# Patient Record
Sex: Female | Born: 1940 | Race: Black or African American | Hispanic: No | Marital: Married | Smoking: Never smoker
Health system: Southern US, Community
[De-identification: ages and names within clinical notes are randomized; demographics above are authoritative.]

## PROBLEM LIST (undated history)

## (undated) DIAGNOSIS — N189 Chronic kidney disease, unspecified: Secondary | ICD-10-CM

## (undated) DIAGNOSIS — M1990 Unspecified osteoarthritis, unspecified site: Secondary | ICD-10-CM

## (undated) DIAGNOSIS — D649 Anemia, unspecified: Secondary | ICD-10-CM

## (undated) DIAGNOSIS — R32 Unspecified urinary incontinence: Secondary | ICD-10-CM

## (undated) DIAGNOSIS — M5431 Sciatica, right side: Secondary | ICD-10-CM

## (undated) DIAGNOSIS — K141 Geographic tongue: Secondary | ICD-10-CM

## (undated) DIAGNOSIS — K219 Gastro-esophageal reflux disease without esophagitis: Secondary | ICD-10-CM

## (undated) DIAGNOSIS — K30 Functional dyspepsia: Secondary | ICD-10-CM

## (undated) DIAGNOSIS — A048 Other specified bacterial intestinal infections: Secondary | ICD-10-CM

## (undated) DIAGNOSIS — N8110 Cystocele, unspecified: Secondary | ICD-10-CM

## (undated) DIAGNOSIS — N368 Other specified disorders of urethra: Secondary | ICD-10-CM

## (undated) DIAGNOSIS — R000 Tachycardia, unspecified: Secondary | ICD-10-CM

## (undated) DIAGNOSIS — E785 Hyperlipidemia, unspecified: Secondary | ICD-10-CM

## (undated) HISTORY — DX: Sciatica, right side: M54.31

## (undated) HISTORY — DX: Geographic tongue: K14.1

## (undated) HISTORY — PX: DILATION AND CURETTAGE OF UTERUS: SHX78

## (undated) HISTORY — PX: OTHER SURGICAL HISTORY: SHX169

## (undated) HISTORY — DX: Other specified bacterial intestinal infections: A04.8

## (undated) HISTORY — DX: Unspecified urinary incontinence: R32

## (undated) HISTORY — DX: Functional dyspepsia: K30

## (undated) HISTORY — DX: Cystocele, unspecified: N81.10

## (undated) HISTORY — DX: Hyperlipidemia, unspecified: E78.5

## (undated) HISTORY — DX: Other specified disorders of urethra: N36.8

---

## 2004-09-03 ENCOUNTER — Ambulatory Visit: Payer: Self-pay | Admitting: Family Medicine

## 2009-12-11 LAB — HM PAP SMEAR: HM PAP: NORMAL

## 2010-04-12 LAB — HM COLONOSCOPY: HM Colonoscopy: NORMAL

## 2011-07-23 DIAGNOSIS — R209 Unspecified disturbances of skin sensation: Secondary | ICD-10-CM | POA: Diagnosis not present

## 2011-07-23 DIAGNOSIS — R1011 Right upper quadrant pain: Secondary | ICD-10-CM | POA: Diagnosis not present

## 2011-07-28 DIAGNOSIS — H15009 Unspecified scleritis, unspecified eye: Secondary | ICD-10-CM | POA: Diagnosis not present

## 2011-08-05 ENCOUNTER — Ambulatory Visit: Payer: Self-pay | Admitting: Family Medicine

## 2011-08-05 DIAGNOSIS — R1011 Right upper quadrant pain: Secondary | ICD-10-CM | POA: Diagnosis not present

## 2011-08-05 DIAGNOSIS — IMO0002 Reserved for concepts with insufficient information to code with codable children: Secondary | ICD-10-CM | POA: Diagnosis not present

## 2011-08-23 DIAGNOSIS — N8111 Cystocele, midline: Secondary | ICD-10-CM | POA: Diagnosis not present

## 2011-08-23 DIAGNOSIS — R350 Frequency of micturition: Secondary | ICD-10-CM | POA: Diagnosis not present

## 2011-12-05 DIAGNOSIS — G4700 Insomnia, unspecified: Secondary | ICD-10-CM | POA: Diagnosis not present

## 2011-12-05 DIAGNOSIS — M5430 Sciatica, unspecified side: Secondary | ICD-10-CM | POA: Diagnosis not present

## 2011-12-05 DIAGNOSIS — M76899 Other specified enthesopathies of unspecified lower limb, excluding foot: Secondary | ICD-10-CM | POA: Diagnosis not present

## 2011-12-05 DIAGNOSIS — R269 Unspecified abnormalities of gait and mobility: Secondary | ICD-10-CM | POA: Diagnosis not present

## 2012-09-08 DIAGNOSIS — W19XXXS Unspecified fall, sequela: Secondary | ICD-10-CM | POA: Diagnosis not present

## 2012-09-08 DIAGNOSIS — M542 Cervicalgia: Secondary | ICD-10-CM | POA: Diagnosis not present

## 2012-10-05 ENCOUNTER — Ambulatory Visit: Payer: Self-pay | Admitting: Family Medicine

## 2012-10-05 DIAGNOSIS — R030 Elevated blood-pressure reading, without diagnosis of hypertension: Secondary | ICD-10-CM | POA: Diagnosis not present

## 2012-10-05 DIAGNOSIS — S93409A Sprain of unspecified ligament of unspecified ankle, initial encounter: Secondary | ICD-10-CM | POA: Diagnosis not present

## 2012-10-05 DIAGNOSIS — M79609 Pain in unspecified limb: Secondary | ICD-10-CM | POA: Diagnosis not present

## 2013-10-09 DIAGNOSIS — H2510 Age-related nuclear cataract, unspecified eye: Secondary | ICD-10-CM | POA: Diagnosis not present

## 2013-10-24 DIAGNOSIS — H349 Unspecified retinal vascular occlusion: Secondary | ICD-10-CM | POA: Diagnosis not present

## 2013-11-01 DIAGNOSIS — H3581 Retinal edema: Secondary | ICD-10-CM | POA: Diagnosis not present

## 2013-11-01 DIAGNOSIS — H2510 Age-related nuclear cataract, unspecified eye: Secondary | ICD-10-CM | POA: Diagnosis not present

## 2013-11-01 DIAGNOSIS — H35059 Retinal neovascularization, unspecified, unspecified eye: Secondary | ICD-10-CM | POA: Diagnosis not present

## 2013-11-27 DIAGNOSIS — H35059 Retinal neovascularization, unspecified, unspecified eye: Secondary | ICD-10-CM | POA: Diagnosis not present

## 2013-11-27 DIAGNOSIS — H43819 Vitreous degeneration, unspecified eye: Secondary | ICD-10-CM | POA: Diagnosis not present

## 2013-11-27 DIAGNOSIS — H2510 Age-related nuclear cataract, unspecified eye: Secondary | ICD-10-CM | POA: Diagnosis not present

## 2014-01-03 DIAGNOSIS — H43819 Vitreous degeneration, unspecified eye: Secondary | ICD-10-CM | POA: Diagnosis not present

## 2014-01-03 DIAGNOSIS — H35059 Retinal neovascularization, unspecified, unspecified eye: Secondary | ICD-10-CM | POA: Diagnosis not present

## 2014-01-03 DIAGNOSIS — H2510 Age-related nuclear cataract, unspecified eye: Secondary | ICD-10-CM | POA: Diagnosis not present

## 2014-02-28 DIAGNOSIS — H43813 Vitreous degeneration, bilateral: Secondary | ICD-10-CM | POA: Diagnosis not present

## 2014-02-28 DIAGNOSIS — H2513 Age-related nuclear cataract, bilateral: Secondary | ICD-10-CM | POA: Diagnosis not present

## 2014-02-28 DIAGNOSIS — H35052 Retinal neovascularization, unspecified, left eye: Secondary | ICD-10-CM | POA: Diagnosis not present

## 2014-04-06 ENCOUNTER — Emergency Department: Payer: Self-pay | Admitting: Emergency Medicine

## 2014-04-06 DIAGNOSIS — M5416 Radiculopathy, lumbar region: Secondary | ICD-10-CM | POA: Diagnosis not present

## 2014-04-06 DIAGNOSIS — R030 Elevated blood-pressure reading, without diagnosis of hypertension: Secondary | ICD-10-CM | POA: Diagnosis not present

## 2014-04-06 DIAGNOSIS — M79651 Pain in right thigh: Secondary | ICD-10-CM | POA: Diagnosis not present

## 2014-04-11 DIAGNOSIS — H2513 Age-related nuclear cataract, bilateral: Secondary | ICD-10-CM | POA: Diagnosis not present

## 2014-04-11 DIAGNOSIS — H43813 Vitreous degeneration, bilateral: Secondary | ICD-10-CM | POA: Diagnosis not present

## 2014-04-11 DIAGNOSIS — H35052 Retinal neovascularization, unspecified, left eye: Secondary | ICD-10-CM | POA: Diagnosis not present

## 2014-04-25 DIAGNOSIS — H2513 Age-related nuclear cataract, bilateral: Secondary | ICD-10-CM | POA: Diagnosis not present

## 2014-04-25 DIAGNOSIS — H35053 Retinal neovascularization, unspecified, bilateral: Secondary | ICD-10-CM | POA: Diagnosis not present

## 2014-04-25 DIAGNOSIS — H43813 Vitreous degeneration, bilateral: Secondary | ICD-10-CM | POA: Diagnosis not present

## 2014-05-02 DIAGNOSIS — K30 Functional dyspepsia: Secondary | ICD-10-CM | POA: Diagnosis not present

## 2014-05-02 DIAGNOSIS — M5431 Sciatica, right side: Secondary | ICD-10-CM | POA: Diagnosis not present

## 2014-05-02 DIAGNOSIS — Z13220 Encounter for screening for lipoid disorders: Secondary | ICD-10-CM | POA: Diagnosis not present

## 2014-05-02 DIAGNOSIS — R634 Abnormal weight loss: Secondary | ICD-10-CM | POA: Diagnosis not present

## 2014-05-09 DIAGNOSIS — R634 Abnormal weight loss: Secondary | ICD-10-CM | POA: Diagnosis not present

## 2014-05-09 DIAGNOSIS — E785 Hyperlipidemia, unspecified: Secondary | ICD-10-CM | POA: Diagnosis not present

## 2014-05-09 DIAGNOSIS — Z13220 Encounter for screening for lipoid disorders: Secondary | ICD-10-CM | POA: Diagnosis not present

## 2014-05-09 DIAGNOSIS — K30 Functional dyspepsia: Secondary | ICD-10-CM | POA: Diagnosis not present

## 2014-05-09 LAB — LIPID PANEL
Cholesterol: 235 mg/dL — AB (ref 0–200)
HDL: 65 mg/dL (ref 35–70)
LDL Cholesterol: 148 mg/dL
Triglycerides: 109 mg/dL (ref 40–160)

## 2014-05-21 DIAGNOSIS — Z1151 Encounter for screening for human papillomavirus (HPV): Secondary | ICD-10-CM | POA: Diagnosis not present

## 2014-05-21 DIAGNOSIS — Z124 Encounter for screening for malignant neoplasm of cervix: Secondary | ICD-10-CM | POA: Diagnosis not present

## 2014-05-21 DIAGNOSIS — Z1239 Encounter for other screening for malignant neoplasm of breast: Secondary | ICD-10-CM | POA: Diagnosis not present

## 2014-05-21 DIAGNOSIS — A048 Other specified bacterial intestinal infections: Secondary | ICD-10-CM | POA: Diagnosis not present

## 2014-05-21 DIAGNOSIS — Z9181 History of falling: Secondary | ICD-10-CM | POA: Diagnosis not present

## 2014-05-21 DIAGNOSIS — Z1389 Encounter for screening for other disorder: Secondary | ICD-10-CM | POA: Diagnosis not present

## 2014-05-21 DIAGNOSIS — Z0000 Encounter for general adult medical examination without abnormal findings: Secondary | ICD-10-CM | POA: Diagnosis not present

## 2014-05-21 DIAGNOSIS — Z1211 Encounter for screening for malignant neoplasm of colon: Secondary | ICD-10-CM | POA: Diagnosis not present

## 2014-06-06 DIAGNOSIS — H43813 Vitreous degeneration, bilateral: Secondary | ICD-10-CM | POA: Diagnosis not present

## 2014-06-06 DIAGNOSIS — H2513 Age-related nuclear cataract, bilateral: Secondary | ICD-10-CM | POA: Diagnosis not present

## 2014-06-06 DIAGNOSIS — H35052 Retinal neovascularization, unspecified, left eye: Secondary | ICD-10-CM | POA: Diagnosis not present

## 2014-07-25 DIAGNOSIS — H43813 Vitreous degeneration, bilateral: Secondary | ICD-10-CM | POA: Diagnosis not present

## 2014-07-25 DIAGNOSIS — H2513 Age-related nuclear cataract, bilateral: Secondary | ICD-10-CM | POA: Diagnosis not present

## 2014-07-25 DIAGNOSIS — H35052 Retinal neovascularization, unspecified, left eye: Secondary | ICD-10-CM | POA: Diagnosis not present

## 2014-09-19 DIAGNOSIS — H43813 Vitreous degeneration, bilateral: Secondary | ICD-10-CM | POA: Diagnosis not present

## 2014-09-19 DIAGNOSIS — H2513 Age-related nuclear cataract, bilateral: Secondary | ICD-10-CM | POA: Diagnosis not present

## 2014-09-19 DIAGNOSIS — H35052 Retinal neovascularization, unspecified, left eye: Secondary | ICD-10-CM | POA: Diagnosis not present

## 2014-11-16 ENCOUNTER — Encounter: Payer: Self-pay | Admitting: Family Medicine

## 2014-11-16 DIAGNOSIS — K141 Geographic tongue: Secondary | ICD-10-CM | POA: Insufficient documentation

## 2014-11-16 DIAGNOSIS — R32 Unspecified urinary incontinence: Secondary | ICD-10-CM | POA: Insufficient documentation

## 2014-11-16 DIAGNOSIS — Z8619 Personal history of other infectious and parasitic diseases: Secondary | ICD-10-CM | POA: Insufficient documentation

## 2014-11-16 DIAGNOSIS — N368 Other specified disorders of urethra: Secondary | ICD-10-CM | POA: Insufficient documentation

## 2014-11-16 DIAGNOSIS — N8110 Cystocele, unspecified: Secondary | ICD-10-CM | POA: Insufficient documentation

## 2014-11-16 DIAGNOSIS — M5430 Sciatica, unspecified side: Secondary | ICD-10-CM | POA: Insufficient documentation

## 2014-11-21 ENCOUNTER — Encounter: Payer: Self-pay | Admitting: Family Medicine

## 2014-11-21 ENCOUNTER — Ambulatory Visit (INDEPENDENT_AMBULATORY_CARE_PROVIDER_SITE_OTHER): Payer: Medicare Other | Admitting: Family Medicine

## 2014-11-21 VITALS — BP 120/62 | HR 76 | Temp 99.0°F | Resp 14 | Ht 65.0 in | Wt 162.4 lb

## 2014-11-21 DIAGNOSIS — Z1239 Encounter for other screening for malignant neoplasm of breast: Secondary | ICD-10-CM | POA: Diagnosis not present

## 2014-11-21 DIAGNOSIS — R32 Unspecified urinary incontinence: Secondary | ICD-10-CM

## 2014-11-21 DIAGNOSIS — Z8619 Personal history of other infectious and parasitic diseases: Secondary | ICD-10-CM | POA: Diagnosis not present

## 2014-11-21 DIAGNOSIS — E785 Hyperlipidemia, unspecified: Secondary | ICD-10-CM

## 2014-11-21 DIAGNOSIS — Z131 Encounter for screening for diabetes mellitus: Secondary | ICD-10-CM

## 2014-11-21 MED ORDER — THERA VITAL M PO TABS: 1.0000 | ORAL_TABLET | Freq: Every day | ORAL | Status: DC

## 2014-11-21 NOTE — Progress Notes (Signed)
Name: Meredith Wilcox   MRN: [id number]    DOB: 1941/01/02   Date:11/21/2014       Progress Note  Subjective  Chief Complaint  Chief Complaint  Patient presents with  . Follow-up    6 month F/U    HPI  Hyperlipidemia: on dietary modification and otc medication only, does not want to take statins. Denies chest pain or SOB  Urge Incontinence: going on for years, denies accidents as long as she go to the bathroom right away. She denies dysuria, no stress incontinence symptoms. She has nocturia times two every night. . Denies back pain or fever, no blood in urine  History of h. Pylori: treated 6 months ago, states occasionally has a weird sensation on substernal  area and would like to be tested for cure   Patient Active Problem List   Diagnosis Date Noted  . Dyslipidemia 11/16/2014  . 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 History of Helicobacter pylori infection 11/16/2014  . 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 Incontinence 11/16/2014  . 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 No pertinent past surgical history.  Family History  Problem Relation Age of Onset  . Diabetes Mother   . Cardiomyopathy Sister   . Down syndrome Sister   . Leukemia Brother   . Alcohol abuse Son     youngest son  . Asthma Son     youngest son  . Rheum arthritis Sister   . Drug abuse Child 27    Social History   Social History  . Marital Status: Married    Spouse Name: N/A  . Number of Children: N/A  . Years of Education: N/A   Occupational History  . Not on file.   Social History Main Topics  . Smoking status: Never Smoker   . Smokeless tobacco: Never Used  . Alcohol Use: No  . Drug Use: No  . Sexual Activity: Not Currently   Other Topics Concern  . Not on file   Social History Narrative     Current outpatient prescriptions:  .  Cholecalciferol (VITAMIN D) 2000 UNITS tablet, Take 1 tablet by mouth daily., Disp: , Rfl:  .  Omega-3 1000 MG CAPS,  Take 1 tablet by mouth daily., Disp: , Rfl:  .  Multiple Vitamins-Minerals (MULTIVITAMIN) tablet, Take 1 tablet by mouth daily., Disp: 30 tablet, Rfl: 0  No Known Allergies   ROS  Constitutional: Negative for fever or weight change.  Respiratory: Negative for cough and shortness of breath.   Cardiovascular: Negative for chest pain or palpitations.  Gastrointestinal: Negative for abdominal pain, no bowel changes.  Musculoskeletal: Negative for gait problem or joint swelling.  Skin: Negative for rash.  Neurological: Negative for dizziness or headache.  No other specific complaints in a complete review of systems (except as listed in HPI above).  Objective  Filed Vitals:   11/21/14 0958  BP: 120/62  Pulse: 76  Temp: 99 F (37.2 C)  TempSrc: Oral  Resp: 14  Height: 5\' 5"  (1.651 m)  Weight: 162 lb 6.4 oz (73.664 kg)  SpO2: 97%    Body mass index is 27.02 kg/(m^2).  Physical Exam  Constitutional: Patient appears well-developed and well-nourished.  No distress.  HEENT: head atraumatic, normocephalic, pupils equal and reactive to light,  neck supple, throat within normal limits Cardiovascular: Normal rate, regular rhythm and normal heart sounds.  No murmur heard. No BLE edema. Pulmonary/Chest: Effort normal and breath sounds normal. No respiratory distress.  Abdominal: Soft.  There is no tenderness. Psychiatric: Patient has a normal mood and affect. behavior is normal. Judgment and thought content normal.   PHQ2/9: Depression screen PHQ 2/9 11/21/2014  Decreased Interest 0  Down, Depressed, Hopeless 0  PHQ - 2 Score 0    Fall Risk: Fall Risk  11/21/2014  Falls in the past year? No     Assessment & Plan  1. Dyslipidemia  - Lipid Profile  2. Incontinence She does not want to take medication for it, also discussed PT she would like to try it _ PT referral  3. Breast cancer screening  - MM Digital Screening; Future  4. Diabetes mellitus screening  -  Glucose  5. History of Helicobacter pylori infection  - H. pylori breath test

## 2014-11-28 DIAGNOSIS — H35052 Retinal neovascularization, unspecified, left eye: Secondary | ICD-10-CM | POA: Diagnosis not present

## 2014-11-28 DIAGNOSIS — H43813 Vitreous degeneration, bilateral: Secondary | ICD-10-CM | POA: Diagnosis not present

## 2014-11-28 DIAGNOSIS — H3581 Retinal edema: Secondary | ICD-10-CM | POA: Diagnosis not present

## 2014-12-04 ENCOUNTER — Ambulatory Visit: Payer: Medicare Other | Attending: Family Medicine | Admitting: Physical Therapy

## 2014-12-04 VITALS — BP 120/62

## 2014-12-04 DIAGNOSIS — R279 Unspecified lack of coordination: Secondary | ICD-10-CM | POA: Insufficient documentation

## 2014-12-04 DIAGNOSIS — R531 Weakness: Secondary | ICD-10-CM | POA: Diagnosis not present

## 2014-12-04 NOTE — Patient Instructions (Addendum)
Meredith Wilcox  [phone]    [place] for Fall Prevention & Arthritis  Starting September 16 (for 8 weeks) Fridays from 1:30 - 2:30 pm For Adults 55+ Cost: FREE (through a [employer] Title [employer] grant)  Use Tai Chi to learn you to stay active without fear of falling or stressing your joints. Make a commitment to yourself t o start living healthier. The instructor is Jeanell Sparrow. Space is limited. Call [phone] to register by September 9.  ___________________________________________  Dr. [email or web address]  Interview to listen  [email or web address]   ____________________________________________  Go to urinate when you sense the urge and do not procrastinate

## 2014-12-05 NOTE — Therapy (Signed)
[place] MAIN [facility] SERVICES [address] [place], [place], [address] Phone: [phone]   Fax:  [phone]  Physical Therapy Evaluation  Patient Details  Name: Meredith Wilcox MRN: [id number] Date of Birth: April 28, 1940 Referring Provider:  [name], MD  Encounter Date: 12/04/2014      PT End of Session - 12/05/14 1144    Visit Number 1   Number of Visits 12   Date for PT Re-Evaluation 02/19/15   Authorization Type G-code 10th  1/10   Activity Tolerance Patient tolerated treatment well;No increased pain   Behavior During Therapy [facility] for tasks assessed/performed      Past Medical History  Diagnosis Date  . Right sided sciatica   . Indigestion   . Geographic tongue   . Hyperlipidemia   . Prolapse of vaginal walls   . Incontinence   . Helicobacter pylori gastrointestinal tract infection   . Urethral prolapse     No past surgical history on file.  Filed Vitals:   12/04/14 1026  BP: 120/62    Visit Diagnosis:  Weakness  Lack of coordination      Subjective Assessment - 12/04/14 1028    Subjective 1) Pt experienced urinary leakage when she has waited and delayed going to urinate beyond the first signal to go. Denied stress urinary incontinence with coughing, sneezing, and lifting . Nocturia 2x/night. Pt explains she likes to drink a big jar of water before she goes to bed.  Pt changes panty liners 1x/day. Bowel movements 1-2x/day and eats lots with vegetables, raisin bran, and  enjoys walking. Pt looks after her grandson who keeps her active walking, and chasing, and up and down stairs. Pt used to go to tai chi classes but  prefers not to drive at night.  2) L sciatic nerve pain along lateral aspect of leg to knee level  started Nov 2015 (pain level 9/10) . Aggravating Factors: laying on stomach, sitting, R sudelying.  Easing Factors: standing. Pt started taking Dark Cherry Juice, Bromelian, Tumeric supplements,  eating less  inflammatory foods, applying Biofreeze, did chair exercises, and prayed alot as self-selected treatments to ease her pain. Today, no pain. Currently, she notices her Sx has improved to the point now upon waking, she only notices  in her L  buttock as if "something is brewing" where she once felt the sciatica. It is non painful.     Pertinent History Hx of 4 vaginal deliveries w/ epiostomies. (2 deliveries with excessive bleeding).              [facility] PT Assessment - 12/05/14 1040    Assessment   Medical Diagnosis urinary incontinence    Precautions   Precautions None   Restrictions   Weight Bearing Restrictions No   Prior Function   Level of Independence Independent   Observation/Other Assessments   Other Surveys  --  UDI 41.6% (lower % indicate better function)    Coordination   Gross Motor Movements are Fluid and Coordinated --  chest breathing dominant, difficulty expanding diaphragm   Fine Motor Movements are Fluid and Coordinated --  abdominal straining w. cue for bowel movement   Posture/Postural Control   Posture Comments lumbopelvic instbaility w/ ALSR    ROM / Strength   AROM / PROM / Strength --  hip abd R 3/5, L 4/5    Palpation   SI assessment  R ASIS more anterior    Palpation comment mm tensions on R upper glut (med/mini)  region                 Pelvic Floor Special Questions - 12-27-14 1044    Diastasis Recti neg   Pelvic Floor Internal Exam pt consented without ocntraindications    Exam Type Vaginal   Palpation no tenderness.tensions of mm noted. bladder position more dorsal but within introitus     Strength weak squeeze, no lift  3/5 with circumferential lift after hips propped on pillow   Strength # of reps --  withheld due to pt displaying inhalation w/ contraction          [facility] Adult PT Treatment/Exercise - 2014/12/27 1040    Self-Care   Self-Care --  see pt instructions   Manual Therapy   Muscle Energy Technique RLE abd/ER ;: R ASIS  symmetrical to L  post-Tx                PT Education - Dec 27, 2014 1134    Education provided Yes   Education Details HEP, POC, anatomy, physiology, goals , wellness   Person(s) Educated Patient   Methods Explanation;Demonstration;Tactile cues;Verbal cues;Handout   Comprehension Verbalized understanding;Returned demonstration             PT Long Term Goals - Dec 27, 2014 1157    PT LONG TERM GOAL #1   Title Pt will decrease her UDI score 41.6% to < 30% in order to demo improved urogenital function and to perform ADLs.   Time 12   Period Weeks   Status New   PT LONG TERM GOAL #2   Title Pt will report not having to wear panty liners for 5 days in order to have more time to look after grandson.    Time 12   Period Weeks   Status New   PT LONG TERM GOAL #3   Title Pt will demo ability to perform diaphragmatic breathing without cuing for 2 visits in orde rto restore pelvic floor function.    Time 12   Period Weeks   Status New   PT LONG TERM GOAL #4   Title Pt will demo increased strength from 3/5 to 5/5 on R hip abd in order to progress to dynamics stability exercises and functional activities like floor to rise.    Time 12   Period Weeks   Status New               Plan - Dec 27, 2014 1147    Clinical Impression Statement Pt is 74 yo female w/ S & Sx consist of urinary incontinence and R sciatica, poor deep core mm coordination, pelvic floor weakness, mm tensions in R glut, weakness in R LE, and pelvic obliquities. These deficit impact her ability to look after her grandson perform ADLs.    Pt will benefit from skilled therapeutic intervention in order to improve on the following deficits Decreased activity tolerance;Pain;Decreased endurance;Decreased scar mobility;Decreased strength;Increased muscle spasms;Improper body mechanics;Postural dysfunction;Decreased mobility;Decreased coordination   Rehab Potential Good   PT Frequency 1x / week   PT Duration 12 weeks   PT  Treatment/Interventions ADLs/Self Care Home Management;Moist Heat;Traction;Electrical Stimulation;Cryotherapy;Functional mobility training;Therapeutic activities;Therapeutic exercise;Balance training;Neuromuscular re-education;Patient/family education;Energy conservation;Dry needling;Scar mobilization;Manual techniques;Gait training   Consulted and Agree with Plan of Care Patient          G-Codes - 27-Dec-2014 06-12-00    Functional Assessment Tool Used UDI-46%   Functional Limitation Self care   Self Care Current Status (O1308) At least 40 percent but less than 60 percent impaired, limited  or restricted   Self Care Goal Status [phone]) At least 20 percent but less than 40 percent impaired, limited or restricted       Problem List Patient Active Problem List   Diagnosis Date Noted  . Dyslipidemia 11/16/2014  . 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 History of Helicobacter pylori infection 11/16/2014  . 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 Incontinence 11/16/2014  . 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 [place], [place], [address] Phone: [phone]   Fax:  [phone]

## 2014-12-12 ENCOUNTER — Ambulatory Visit: Payer: Medicare Other | Attending: Family Medicine | Admitting: Physical Therapy

## 2014-12-12 DIAGNOSIS — R531 Weakness: Secondary | ICD-10-CM | POA: Insufficient documentation

## 2014-12-12 DIAGNOSIS — R279 Unspecified lack of coordination: Secondary | ICD-10-CM | POA: Diagnosis not present

## 2014-12-12 NOTE — Patient Instructions (Signed)
Crocodile breathing Open book , child pose 3-way  Lifting w/ exhale

## 2014-12-13 NOTE — Therapy (Signed)
[place] MAIN [facility] SERVICES [address] [place], [place], [address] Phone: [phone]   Fax:  [phone]  Physical Therapy Treatment  Patient Details  Name: Meredith Wilcox MRN: [id number] Date of Birth: [birth year] Referring Provider:  [name], MD  Encounter Date: 12/12/2014      PT End of Session - 12/12/14 2358    Visit Number 2   Number of Visits 12   Date for PT Re-Evaluation 02/19/15   Authorization Type G-code 10th  2/10   PT Start Time 1005   PT Stop Time 1105   PT Time Calculation (min) 60 min   Activity Tolerance Patient tolerated treatment well;No increased pain   Behavior During Therapy [facility] for tasks assessed/performed      Past Medical History  Diagnosis Date  . Right sided sciatica   . Indigestion   . Geographic tongue   . Hyperlipidemia   . Prolapse of vaginal walls   . Incontinence   . Helicobacter pylori gastrointestinal tract infection   . Urethral prolapse     No past surgical history on file.  There were no vitals filed for this visit.  Visit Diagnosis:  Weakness  Lack of coordination      Subjective Assessment - 12/12/14 1013    Subjective Pt reported she is practicing log rolling and is interested in [place] more about breathing properly.    Pertinent History Hx of 4 vaginal deliveries w/ epiostomies. (2 deliveries with excessive bleeding).              [facility] PT Assessment - 12/12/14 1050    ROM / Strength   AROM / PROM / Strength --  Diaphragmatic excursion increased form 1 cm to 3 cm post-Tx   Palpation   Spinal mobility significant mm tensions along midback paraspinals, T3, 12 hypomobility most significant, T1-12 hypombile    pre-Tx: 1cm diaphragmatic excursion, post-Tx 3 cm                      [facility] Adult PT Treatment/Exercise - 12/12/14 1052    Therapeutic Activites    Lifting 10# 5 reps, chair, short stool , coordinated w/ exhalation   Neuro Re-ed    Neuro  Re-ed Details  crocodile breathing (prone diaphragmatic breathing  paced breathing   Exercises   Exercises --  open book, 3-way childs pose    Moist Heat Therapy   Number Minutes Moist Heat 10 Minutes   Moist Heat Location --  back, skin in tact post-Tx   Manual Therapy   Joint Mobilization T1-T12 Grade III PA 90 sec. TP, CVJ bilaterally Grade I-II 15 sec    Soft tissue mobilization sustained pressure, trigger point release, vibration, MWM along thoracic paraspinals bilaterally                 PT Education - 12/12/14 2357    Education provided Yes   Education Details HEP, physiology and anatomy with animation models   Person(s) Educated Patient   Methods Explanation;Demonstration;Tactile cues;Verbal cues;Handout   Comprehension Verbalized understanding;Returned demonstration             PT Long Term Goals - 12/05/14 1157    PT LONG TERM GOAL #1   Title Pt will decrease her UDI score 41.6% to < 30% in order to demo improved urogenital function and to perform ADLs.   Time 12   Period Weeks   Status New   PT LONG TERM GOAL #2  Title Pt will report not having to wear panty liners for 5 days in order to have more time to look after grandson.    Time 12   Period Weeks   Status New   PT LONG TERM GOAL #3   Title Pt will demo ability to perform diaphragmatic breathing without cuing for 2 visits in orde rto restore pelvic floor function.    Time 12   Period Weeks   Status New   PT LONG TERM GOAL #4   Title Pt will demo increased strength from 3/5 to 5/5 on R hip abd in order to progress to dynamics stability exercises and functional activities like floor to rise.    Time 12   Period Weeks   Status New               Plan - 12/13/14 0001    Clinical Impression Statement Pt progressed well today with positive response to manual Tx to release thoracic tensions and hypomobility. Pt achieved significantly increased diaphragmatic breathing and anticipate she will  progress towards pelvic floor coordination at next visit.     Pt will benefit from skilled therapeutic intervention in order to improve on the following deficits Decreased activity tolerance;Pain;Decreased endurance;Decreased scar mobility;Decreased strength;Increased muscle spasms;Improper body mechanics;Postural dysfunction;Decreased mobility;Decreased coordination   Rehab Potential Good   PT Frequency 1x / week   PT Duration 12 weeks   PT Treatment/Interventions ADLs/Self Care Home Management;Moist Heat;Traction;Electrical Stimulation;Cryotherapy;Functional mobility training;Therapeutic activities;Therapeutic exercise;Balance training;Neuromuscular re-education;Patient/family education;Energy conservation;Dry needling;Scar mobilization;Manual techniques;Gait training   Consulted and Agree with Plan of Care Patient        Problem List Patient Active Problem List   Diagnosis Date Noted  . Dyslipidemia 11/16/2014  . 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 History of Helicobacter pylori infection 11/16/2014  . 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 Incontinence 11/16/2014  . 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 [place], [place], [address] Phone: [phone]   Fax:  [phone]

## 2014-12-19 ENCOUNTER — Encounter: Payer: Medicare Other | Admitting: Physical Therapy

## 2014-12-20 ENCOUNTER — Ambulatory Visit: Payer: Medicare Other | Admitting: Physical Therapy

## 2014-12-20 DIAGNOSIS — R279 Unspecified lack of coordination: Secondary | ICD-10-CM | POA: Diagnosis not present

## 2014-12-20 DIAGNOSIS — R531 Weakness: Secondary | ICD-10-CM | POA: Diagnosis not present

## 2014-12-20 NOTE — Patient Instructions (Addendum)
PELVIC FLOOR / KEGEL EXERCISES   Pelvic floor/ Kegel exercises are used to strengthen the muscles in the base of your pelvis that are responsible for supporting your pelvic organs and preventing urine/feces leakage. Based on your therapist's recommendations, they can be performed while standing, sitting, or lying down. Imagine pelvic floor area as a diamond with pelvic landmarks: top =pubic bone, bottom tip=tailbone, sides=sitting bones (ischial tuberosities).    Make yourself aware of this muscle group by using these cues while coordinating your breath:  Inhale, feel pelvic floor diamond area lower like hammock towards your feet and ribcage/belly expanding. Pause. Let the exhale naturally and feel your belly sink, abdominal muscles hugging in around you and you may notice the pelvic diamond draws upward towards your head forming a umbrella shape. Give a squeeze during the exhalation like you are stopping the flow of urine. If you are squeezing the buttock muscles, try to give 50% less effort.   Common Errors:  Breath holding: If you are holding your breath, you may be bearing down against your bladder instead of pulling it up. If you belly bulges up while you are squeezing, you are holding your breath. Be sure to breathe gently in and out while exercising. Counting out loud may help you avoid holding your breath.  Accessory muscle use: You should not see or feel other muscle movement when performing pelvic floor exercises. When done properly, no one can tell that you are performing the exercises. Keep the buttocks, belly and inner thighs relaxed.  Overdoing it: Your muscles can fatigue and stop working for you if you over-exercise. You may actually leak more or feel soreness at the lower abdomen or rectum.  YOUR HOME EXERCISE PROGRAM  LONG HOLDS: Position: on back  Inhale and then exhale. Then squeeze the muscle and count aloud for 3 seconds. Rest with three long breaths. (Be sure to let  belly sink in with exhales and not push outward)  Perform 7 repetitions, 3 times/day                        DECREASE DOWNWARD PRESSURE ON  YOUR PELVIC FLOOR, ABDOMINAL, LOW BACK MUSCLES       PRESERVE YOUR PELVIC HEALTH LONG-TERM   ** SQUEEZE pelvic floor BEFORE YOUR SNEEZE, COUGH, LAUGH   ** EXHALE BEFORE YOU RISE AGAINST GRAVITY (lifting, sit to stand, from squat to stand)   ** LOG ROLL OUT OF BED INSTEAD OF CRUNCH/SIT-UP   _________________________________________________________________________________________   Walking and pushing stroller with deep core muscles engaged with exhalation/ inhalation through the nose, and shoulders gently squeezing back like a holding a "pencil under your armpit".   Exhalation with lifting grandson to put him in the swing

## 2014-12-21 NOTE — Therapy (Signed)
[place] MAIN [facility] SERVICES [address] [place], [place], [address] Phone: [phone]   Fax:  [phone]  Physical Therapy Treatment  Patient Details  Name: Meredith Wilcox MRN: [id number] Date of Birth: [birth year] Referring Provider:  [name], MD  Encounter Date: 12/20/2014      PT End of Session - 12/21/14 1010    Visit Number 3   Number of Visits 12   Date for PT Re-Evaluation 02/19/15   Authorization Type G-code 10th  2/10   PT Start Time 1000   PT Stop Time 1100   PT Time Calculation (min) 60 min   Activity Tolerance Patient tolerated treatment well;No increased pain   Behavior During Therapy [facility] for tasks assessed/performed      Past Medical History  Diagnosis Date  . Right sided sciatica   . Indigestion   . Geographic tongue   . Hyperlipidemia   . Prolapse of vaginal walls   . Incontinence   . Helicobacter pylori gastrointestinal tract infection   . Urethral prolapse     No past surgical history on file.  There were no vitals filed for this visit.  Visit Diagnosis:  Weakness  Lack of coordination      Subjective Assessment - 12/21/14 1007    Subjective Pt has been walking her grandson in a stroller for 2 miles for two days. Pt has been practicing diaphragm breathing.    Pertinent History Hx of 4 vaginal deliveries w/ epiostomies. (2 deliveries with excessive bleeding).              [facility] PT Assessment - 12/21/14 0001    Coordination   Coordination and Movement Description improved diaphragmatic breathing                  Pelvic Floor Special Questions - 12/21/14 1008    Pelvic Floor Internal Exam pt consented without ocntraindications    Exam Type Vaginal   Strength fair squeeze, definite lift  without hips elevated   Strength # of reps 7   Biofeedback moderate cuing for less abdominal straining, improved diaphragmatic breathing           [facility] Adult PT Treatment/Exercise -  12/21/14 1010    Therapeutic Activites    Lifting 25# 3 reps, and carry box a fwe steps to simulate placing grandson into swing   with deep core coodination   Neuro Re-ed    Neuro Re-ed Details  pelvic floor long holds with rest break                PT Education - 12/21/14 1010    Education provided Yes   Education Details HEP   Person(s) Educated Patient   Methods Explanation;Demonstration;Verbal cues;Tactile cues;Handout   Comprehension Verbalized understanding;Returned demonstration             PT Long Term Goals - 12/20/14 1013    PT LONG TERM GOAL #1   Title Pt will decrease her UDI score 41.6% to < 30% in order to demo improved urogenital function and to perform ADLs.   Time 12   Period Weeks   Status On-going   PT LONG TERM GOAL #2   Title Pt will report not having to wear panty liners for 5 days in order to have more time to look after grandson.    Time 12   Period Weeks   Status On-going   PT LONG TERM GOAL #3   Title Pt will demo ability  to perform diaphragmatic breathing without cuing for 2 visits in order to restore pelvic floor function.    Time 12   Period Weeks   Status Partially Met   PT LONG TERM GOAL #4   Title Pt will demo increased strength from 3/5 to 5/5 on R hip abd in order to progress to dynamics stability exercises and functional activities like floor to rise.    Time 12   Period Weeks   Status New               Plan - 12/21/14 1011    Clinical Impression Statement Pt is progressing well with improved pelvic floor contraction strength without elevation of hips on pillow, indicating increased fascial tensigrity and improved positioning of pelvic organs. Pt demo'd proper functional applications of deep core coordination with lifting and proper body mechanics in simulated task to lift grandson into swing. Anticipate pt will progess towards goals as she remains motivated and compliant.    Pt will benefit from skilled therapeutic  intervention in order to improve on the following deficits Decreased activity tolerance;Pain;Decreased endurance;Decreased scar mobility;Decreased strength;Increased muscle spasms;Improper body mechanics;Postural dysfunction;Decreased mobility;Decreased coordination   Rehab Potential Good   PT Frequency 1x / week   PT Duration 12 weeks   PT Treatment/Interventions ADLs/Self Care Home Management;Moist Heat;Traction;Electrical Stimulation;Cryotherapy;Functional mobility training;Therapeutic activities;Therapeutic exercise;Balance training;Neuromuscular re-education;Patient/family education;Energy conservation;Dry needling;Scar mobilization;Manual techniques;Gait training   Consulted and Agree with Plan of Care Patient        Problem List Patient Active Problem List   Diagnosis Date Noted  . Dyslipidemia 11/16/2014  . 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 History of Helicobacter pylori infection 11/16/2014  . 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 Incontinence 11/16/2014  . Prolapse of urethra 11/16/2014    Jerl Mina ,PT, DPT, E-RYT  12/21/2014, 10:15 AM  [facility] MAIN [facility] SERVICES [address] [place], [place], [address] Phone: [phone]   Fax:  [phone]

## 2014-12-24 ENCOUNTER — Encounter: Payer: Medicare Other | Admitting: Physical Therapy

## 2014-12-26 ENCOUNTER — Ambulatory Visit: Payer: Medicare Other | Admitting: Physical Therapy

## 2014-12-26 DIAGNOSIS — R531 Weakness: Secondary | ICD-10-CM | POA: Diagnosis not present

## 2014-12-26 DIAGNOSIS — R279 Unspecified lack of coordination: Secondary | ICD-10-CM

## 2014-12-26 NOTE — Patient Instructions (Signed)
Seated pelvic floor exercises, breathing coordinated -quick holds 5 reps, 5 x times day   Single leg bridges with L foot closer under knee, R foot placed more forward  10reps x 2 sets/   2 x day    R sidelying clams  10 reps x 2 sets / 2 x day   Figure -4 stretch with towel  5 breaths both leg

## 2014-12-26 NOTE — Therapy (Signed)
[place] MAIN [facility] SERVICES [address] [place], [place], [address] Phone: [phone]   Fax:  [phone]  Physical Therapy Treatment  Patient Details  Name: Nycole Kawahara MRN: [id number] Date of Birth: 08-Mar-1941 Referring Provider:  [name], MD  Encounter Date: 12/26/2014      PT End of Session - 12/26/14 1018    Visit Number 4   Number of Visits 12   Date for PT Re-Evaluation 02/19/15   Authorization Type G-code 10th  4/10   PT Start Time 1025   PT Stop Time 1100   PT Time Calculation (min) 35 min      Past Medical History  Diagnosis Date  . Right sided sciatica   . Indigestion   . Geographic tongue   . Hyperlipidemia   . Prolapse of vaginal walls   . Incontinence   . Helicobacter pylori gastrointestinal tract infection   . Urethral prolapse     No past surgical history on file.  There were no vitals filed for this visit.  Visit Diagnosis:  Weakness  Lack of coordination      Subjective Assessment - 12/26/14 1017    Subjective Pt has been practicing lifting technique with son. Pt reported nocturia  1x for 2 nights across past week. Pt is going to the bathroom with the initial signal to go.    Pertinent History Hx of 4 vaginal deliveries w/ epiostomies. (2 deliveries with excessive bleeding).              [facility] PT Assessment - 12/26/14 1444    Coordination   Gross Motor Movements are Fluid and Coordinated --  demo'd abdominal straining w. exhalation (seated)                      [facility] Adult PT Treatment/Exercise - 12/26/14 1215    Neuro Re-ed    Neuro Re-ed Details  seated quick pelvic floor contractions   Exercises   Other Exercises  RLE WBing bridges, R clam shells 10reps x 2                 PT Education - 12/26/14 1210    Education provided Yes   Education Details HEP   Person(s) Educated Patient   Methods Explanation;Demonstration;Tactile cues;Verbal cues;Handout   Comprehension Returned demonstration;Verbalized understanding             PT Long Term Goals - 12/26/14 1027    PT LONG TERM GOAL #1   Title Pt will decrease her UDI score 41.6% to < 30% in order to demo improved urogenital function and to perform ADLs.   Time 12   Period Weeks   Status On-going   PT LONG TERM GOAL #2   Title Pt will report not having to wear panty liners for 5 days in order to have more time to look after grandson.    Time 12   Period Weeks   Status On-going   PT LONG TERM GOAL #3   Title Pt will demo ability to perform diaphragmatic breathing without cuing for 2 visits in order to restore pelvic floor function.    Time 12   Period Weeks   Status Achieved   PT LONG TERM GOAL #4   Title Pt will demo increased strength from 3/5 to 5/5 on L hip abd in order to progress to dynamics stability exercises and functional activities like floor to rise.    Time 12   Period  Weeks   Status New               Plan - 12/26/14 1210    Clinical Impression Statement Pt has progressed to seated pelvic floor quick holds with mild cuing. Pt tolerated hip abduction strengthening (open chain) exercises (with pelvic floor activation)  without difficulty. Plan to progress pt to closed chain strengthening at next session.     Pt will benefit from skilled therapeutic intervention in order to improve on the following deficits Decreased activity tolerance;Pain;Decreased endurance;Decreased scar mobility;Decreased strength;Increased muscle spasms;Improper body mechanics;Postural dysfunction;Decreased mobility;Decreased coordination   Rehab Potential Good   PT Frequency 1x / week   PT Duration 12 weeks   PT Treatment/Interventions ADLs/Self Care Home Management;Moist Heat;Traction;Electrical Stimulation;Cryotherapy;Functional mobility training;Therapeutic activities;Therapeutic exercise;Balance training;Neuromuscular re-education;Patient/family education;Energy conservation;Dry  needling;Scar mobilization;Manual techniques;Gait training   Consulted and Agree with Plan of Care Patient        Problem List Patient Active Problem List   Diagnosis Date Noted  . Dyslipidemia 11/16/2014  . 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 History of Helicobacter pylori infection 11/16/2014  . 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 Incontinence 11/16/2014  . Prolapse of urethra 11/16/2014    Jerl Mina ,PT, DPT, E-RYT  12/26/2014, 2:47 PM  [place] MAIN [facility] SERVICES [address] [place], [place], [address] Phone: [phone]   Fax:  [phone]

## 2015-01-02 ENCOUNTER — Ambulatory Visit: Payer: Medicare Other | Admitting: Physical Therapy

## 2015-01-02 DIAGNOSIS — R531 Weakness: Secondary | ICD-10-CM

## 2015-01-02 DIAGNOSIS — R279 Unspecified lack of coordination: Secondary | ICD-10-CM

## 2015-01-02 NOTE — Patient Instructions (Addendum)
Pelvic floor endurance holds:  7 sec holds, 4 reps, 4 x day   Belly rise three times (rest break)  On the fourth time, exhale and squeeze for a count of 7 secs  Change finger to keep count of the number of reps  (rest breath begins again) Let belly rise three times On the fourth time, exhale and squeeze for a count of 7 secs  Change finger to keep count of the number of reps Stop when you have four fingers open. (4 reps)   Morning early, noon, 3pm, bedtime  4x day    __________________________________   This week, practice going without  pantyliners before the beach trip      __________________________________   Meredith Wilcox hallway L/R  Inhale down, "smell roses Exhale up 'blow out birthday candles"

## 2015-01-03 NOTE — Therapy (Signed)
[place] MAIN [facility] SERVICES [address] [place], [place], [address] Phone: [phone]   Fax:  [phone]  Physical Therapy Treatment  Patient Details  Name: Meredith Wilcox MRN: [id number] Date of Birth: Mar 06, 1941 Referring Provider:  [name], MD  Encounter Date: 01/02/2015      PT End of Session - 01/02/15 1047    Visit Number 5   Number of Visits 12   Date for PT Re-Evaluation 02/19/15   Authorization Type G-code 10th  4/10   PT Start Time 1000   PT Stop Time 1100   PT Time Calculation (min) 60 min   Activity Tolerance Patient tolerated treatment well;No increased pain   Behavior During Therapy [facility] for tasks assessed/performed      Past Medical History  Diagnosis Date  . Right sided sciatica   . Indigestion   . Geographic tongue   . Hyperlipidemia   . Prolapse of vaginal walls   . Incontinence   . Helicobacter pylori gastrointestinal tract infection   . Urethral prolapse     No past surgical history on file.  There were no vitals filed for this visit.  Visit Diagnosis:  Weakness  Lack of coordination      Subjective Assessment - 01/02/15 1005    Subjective Pt reported her nocturia still occurs now for 1x  night.  Pt wears only one urinary pads. Pt reported she has improved by 75% with urinary incontinence problem.    Pertinent History Hx of 4 vaginal deliveries w/ epiostomies. (2 deliveries with excessive bleeding).                        Pelvic Floor Special Questions - 01/02/15 1007    Prolapse --  bladder position more cephaled/lifted w/exhale/contraction   Pelvic Floor Internal Exam pt consented without ocntraindications    Exam Type Vaginal   Strength good squeeze, good lift, able to hold agaisnt strong resistance  bladder position more cephaled and  w/o pillow under hips   Strength # of reps 7   Biofeedback improved pelvic floor / diaphragmatic breathing           [facility] Adult PT  Treatment/Exercise - 01/03/15 1230    Neuro Re-ed    Neuro Re-ed Details  pelvic floor endurance coordination w/ emphasis on rest period and not contracting w/ inhalation  reviewed clam shells, required cuing on form    Exercises   Other Exercises  reviewed clamshells, added side step squats                PT Education - 01/03/15 1231    Education provided Yes   Education Details HEP   Person(s) Educated Patient   Methods Explanation;Demonstration;Tactile cues;Verbal cues;Handout   Comprehension Verbalized understanding;Returned demonstration             PT Long Term Goals - 01/02/15 1006    PT LONG TERM GOAL #1   Title Pt will decrease her UDI score 41.6% to < 30% in order to demo improved urogenital function and to perform ADLs. (01/02/15: 41.6% pt reports er frequent urination is due to her drinking water frequently)    Time 12   Period Weeks   Status Deferred   PT LONG TERM GOAL #2   Title Pt will report not having to wear panty liners for 3 days in order to have more time to look after grandson.    Time 12   Period Weeks  Status Partially Met   PT LONG TERM GOAL #3   Title Pt will demo ability to perform diaphragmatic breathing without cuing for 2 visits in order to restore pelvic floor function.    Time 12   Period Weeks   Status Achieved   PT LONG TERM GOAL #4   Title Pt will demo increased strength from 3/5 to 5/5 on L hip abd in order to progress to dynamics stability exercises and functional activities like floor to rise.    Time 12   Period Weeks   Status On-going   PT LONG TERM GOAL #5   Title Pt will maintain cephaled position of bladder and be able to perform Grade 4 strength, 7 sec holds, 5 reps in order to minimize prolapse of bladder and pelivic floor dysfunction.    Time 12   Period Weeks   Status New               Plan - 01/03/15 1231    Clinical Impression Statement Pt requried excessive cuing for pelvic floor coordination. Pt  demo'd significant improvment with a more cephaled  bladder position which indicate increased fascial tensigrity and minimize worsening of prolapse.    Pt will benefit from skilled therapeutic intervention in order to improve on the following deficits Decreased activity tolerance;Pain;Decreased endurance;Decreased scar mobility;Decreased strength;Increased muscle spasms;Improper body mechanics;Postural dysfunction;Decreased mobility;Decreased coordination   Rehab Potential Good   PT Frequency 1x / week   PT Duration 12 weeks   PT Treatment/Interventions ADLs/Self Care Home Management;Moist Heat;Traction;Electrical Stimulation;Cryotherapy;Functional mobility training;Therapeutic activities;Therapeutic exercise;Balance training;Neuromuscular re-education;Patient/family education;Energy conservation;Dry needling;Scar mobilization;Manual techniques;Gait training   Consulted and Agree with Plan of Care Patient        Problem List Patient Active Problem List   Diagnosis Date Noted  . Dyslipidemia 11/16/2014  . 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 History of Helicobacter pylori infection 11/16/2014  . 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 Incontinence 11/16/2014  . Prolapse of urethra 11/16/2014    Jerl Mina  ,PT, DPT, E-RYT  01/03/2015, 12:44 PM  [place] MAIN [facility] SERVICES [address] [place], [place], [address] Phone: [phone]   Fax:  [phone]

## 2015-01-09 ENCOUNTER — Encounter: Payer: Medicare Other | Admitting: Physical Therapy

## 2015-01-10 ENCOUNTER — Encounter: Payer: Medicare Other | Admitting: Physical Therapy

## 2015-01-16 ENCOUNTER — Ambulatory Visit: Payer: Medicare Other | Attending: Family Medicine | Admitting: Physical Therapy

## 2015-01-16 ENCOUNTER — Encounter: Payer: Medicare Other | Admitting: Physical Therapy

## 2015-01-16 DIAGNOSIS — R531 Weakness: Secondary | ICD-10-CM | POA: Diagnosis not present

## 2015-01-16 DIAGNOSIS — R279 Unspecified lack of coordination: Secondary | ICD-10-CM | POA: Insufficient documentation

## 2015-01-16 NOTE — Therapy (Addendum)
[place] MAIN [facility] SERVICES [address] [place], [place], [address] Phone: [phone]   Fax:  [phone]  Physical Therapy Treatment /Discharge Summary   Patient Details  Name: Meredith Wilcox MRN: [id number] Date of Birth: 1940-04-22 Referring Provider:  [name], MD  Encounter Date: 01/16/2015      PT End of Session - 01/16/15 1319    Visit Number 6   Number of Visits 12   Date for PT Re-Evaluation 02/19/15   Authorization Type G-code 10th  4/10   PT Start Time 1250   PT Stop Time 1320   PT Time Calculation (min) 30 min   Activity Tolerance Patient tolerated treatment well;No increased pain   Behavior During Therapy [facility] for tasks assessed/performed      Past Medical History  Diagnosis Date  . Right sided sciatica   . Indigestion   . Geographic tongue   . Hyperlipidemia   . Prolapse of vaginal walls   . Incontinence   . Helicobacter pylori gastrointestinal tract infection   . Urethral prolapse     No past surgical history on file.  There were no vitals filed for this visit.  Visit Diagnosis:  Weakness  Lack of coordination      Subjective Assessment - 01/16/15 1256    Subjective Pt now no longer needs to wear her pads nor wets her pads because she no longer waits to urinate last minute. Pt recorrects her posture in seats and in standing. Nocturia episodes has decreased to 1x night.    Pertinent History Hx of 4 vaginal deliveries w/ epi ostomies. (2 deliveries with excessive bleeding).              [facility] PT Assessment - 01/16/15 1336    ROM / Strength   AROM / PROM / Strength --  hip abd 5/5 bilaterally                  Pelvic Floor Special Questions - 01/16/15 1340    Pelvic Floor Internal Exam pt consented without ocntraindications    Exam Type Vaginal   Strength good squeeze, good lift, able to hold agaisnt strong resistance  bladder position more cephaled and  w/o pillow under hips    Strength # of reps 5   Strength # of seconds 7   Biofeedback improved coordination and normal bladder position with complete contraction and lift of pelvic floor mm without pillow under hips           [facility] Adult PT Treatment/Exercise - 01/16/15 1336    Posture/Postural Control   Posture Comments seated posture, walking more upright without cuing                     PT Long Term Goals - 01/16/15 1259    PT LONG TERM GOAL #1   Title Pt will decrease her UDI score 41.6% to < 30% in order to demo improved urogenital function and to perform ADLs. (01/02/15: 41.6% pt reports er frequent urination is due to her drinking water frequently)    Time 12   Period Weeks   Status Deferred   PT LONG TERM GOAL #2   Title Pt will report not having to wear panty liners for 3 days in order to have more time to look after grandson.    Time 12   Period Weeks   Status Achieved   PT LONG TERM GOAL #3   Title Pt will demo ability  to perform diaphragmatic breathing without cuing for 2 visits in order to restore pelvic floor function.    Time 12   Period Weeks   Status Achieved   PT LONG TERM GOAL #4   Title Pt will demo increased strength from 3/5 to 5/5 on L hip abd in order to progress to dynamics stability exercises and functional activities like floor to rise.    Time 12   Period Weeks   Status Achieved   PT LONG TERM GOAL #5   Title Pt will maintain cephaled position of bladder and be able to perform Grade 4 strength, 7 sec holds, 5 reps in order to minimize prolapse of bladder and pelivic floor dysfunction.    Time 12   Period Weeks   Status Achieved               Plan - 2015/01/26 1320    Clinical Impression Statement Pt has achieved 100% of her goals after 6 visits of Pelvic Health PT. Pt reports she has improved "A Very Great Deal Better" since [facility] regarding her urinary and LBP Sx. Pt has decreased pad use and nocturia episodes. She also can control her bladder when  laughing, sneezing, and coughing. Pt demo 'd significantly improved deep core mm coordination (breathing with pelvic floor movement), pelvic floor strength and endurance, as well as more cephalad position of her bladder. Pt is able to demo proper body mechanics with use of deep core mm activation with bed mobility and lifting. Pt is ready for discharge. Pt has been a pleasure to work with as pt has remained motivated and compliant.       Pt will benefit from skilled therapeutic intervention in order to improve on the following deficits Decreased activity tolerance;Pain;Decreased endurance;Decreased scar mobility;Decreased strength;Increased muscle spasms;Improper body mechanics;Postural dysfunction;Decreased mobility;Decreased coordination   Rehab Potential Good   PT Frequency 1x / week   PT Duration 12 weeks   PT Treatment/Interventions ADLs/Self Care Home Management;Moist Heat;Traction;Electrical Stimulation;Cryotherapy;Functional mobility training;Therapeutic activities;Therapeutic exercise;Balance training;Neuromuscular re-education;Patient/family education;Energy conservation;Dry needling;Scar mobilization;Manual techniques;Gait training   Consulted and Agree with Plan of Care Patient          G-Codes - 01/26/2015 1327    Functional Assessment Tool Used clinical judgement and subjective   Functional Limitation Self care   Self Care Current Status [phone]) At least 40 percent but less than 60 percent impaired, limited or restricted   Self Care Goal Status ([id number]) At least 20 percent but less than 40 percent impaired, limited or restricted   Self Care Discharge Status [phone]) At least 1 percent but less than 20 percent impaired, limited or restricted      Problem List Patient Active Problem List   Diagnosis Date Noted  . Dyslipidemia 11/16/2014  . 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 History of Helicobacter pylori infection 11/16/2014  . 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 Incontinence 11/16/2014  . Prolapse of urethra 11/16/2014    Jerl Mina ,PT, DPT, E-RYT  January 26, 2015, 1:42 PM  [place] MAIN [facility] SERVICES [address] [place], [place], [address] Phone: [phone]   Fax:  [phone]

## 2015-01-23 ENCOUNTER — Encounter: Payer: Medicare Other | Admitting: Physical Therapy

## 2015-02-18 DIAGNOSIS — H02836 Dermatochalasis of left eye, unspecified eyelid: Secondary | ICD-10-CM | POA: Diagnosis not present

## 2015-02-18 DIAGNOSIS — H43813 Vitreous degeneration, bilateral: Secondary | ICD-10-CM | POA: Diagnosis not present

## 2015-02-18 DIAGNOSIS — H02833 Dermatochalasis of right eye, unspecified eyelid: Secondary | ICD-10-CM | POA: Diagnosis not present

## 2015-02-18 DIAGNOSIS — H2513 Age-related nuclear cataract, bilateral: Secondary | ICD-10-CM | POA: Diagnosis not present

## 2015-02-18 DIAGNOSIS — H35052 Retinal neovascularization, unspecified, left eye: Secondary | ICD-10-CM | POA: Diagnosis not present

## 2015-05-26 ENCOUNTER — Encounter: Payer: Medicare Other | Admitting: Family Medicine

## 2015-05-26 SURGERY — Surgical Case
Anesthesia: *Unknown

## 2015-06-04 ENCOUNTER — Ambulatory Visit (INDEPENDENT_AMBULATORY_CARE_PROVIDER_SITE_OTHER): Payer: Medicare Other | Admitting: Family Medicine

## 2015-06-04 ENCOUNTER — Encounter: Payer: Self-pay | Admitting: Family Medicine

## 2015-06-04 VITALS — BP 122/66 | HR 89 | Temp 100.1°F | Resp 20 | Ht 65.0 in | Wt 153.9 lb

## 2015-06-04 DIAGNOSIS — J40 Bronchitis, not specified as acute or chronic: Secondary | ICD-10-CM | POA: Diagnosis not present

## 2015-06-04 DIAGNOSIS — J302 Other seasonal allergic rhinitis: Secondary | ICD-10-CM | POA: Diagnosis not present

## 2015-06-04 MED ORDER — AZITHROMYCIN 250 MG PO TABS: ORAL_TABLET | ORAL | Status: DC

## 2015-06-04 MED ORDER — PREDNISONE 20 MG PO TABS: 20.0000 mg | ORAL_TABLET | Freq: Every day | ORAL | Status: DC

## 2015-06-04 MED ORDER — HYDROCOD POLST-CPM POLST ER 10-8 MG/5ML PO SUER: 5.0000 mL | Freq: Two times a day (BID) | ORAL | Status: DC | PRN

## 2015-06-04 MED ORDER — FLUTICASONE FUROATE-VILANTEROL 100-25 MCG/INH IN AEPB: 1.0000 | INHALATION_SPRAY | Freq: Every day | RESPIRATORY_TRACT | Status: DC

## 2015-06-04 MED ORDER — LORATADINE 10 MG PO TABS: 10.0000 mg | ORAL_TABLET | Freq: Every day | ORAL | Status: DC

## 2015-06-04 NOTE — Progress Notes (Signed)
Name: Meredith Wilcox   MRN: [id number]    DOB: 1940/12/16   Date:06/04/2015       Progress Note  Subjective  Chief Complaint  Chief Complaint  Patient presents with  . URI    Onset-Friday, deep cough with clear mucus, itchy throat, watery eyes, hacking cough, low grade fever, nasal drip, malaise and fatigue, no appetite, Otc Garlic Tea and Honey    HPI  Bronchitis: symptoms started 6 days with rhinorrhea, nasal congestion, watery eyes, itchy throat, followed by a wet cough, decrease in appetite. She denies wheezing or SOB. Her body temperature has been higher than usual. She denies history of asthma  AR: she has seasonal allergies, not taking any medications.   Patient Active Problem List   Diagnosis Date Noted  . Dyslipidemia 11/16/2014  . 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 History of Helicobacter pylori infection 11/16/2014  . 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 Incontinence 11/16/2014  . 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 No pertinent past surgical history.  Family History  Problem Relation Age of Onset  . Diabetes Mother   . Cardiomyopathy Sister   . Down syndrome Sister   . Leukemia Brother   . Alcohol abuse Son     youngest son  . Asthma Son     youngest son  . Rheum arthritis Sister   . Drug abuse Child 66    Social History   Social History  . Marital Status: Married    Spouse Name: N/A  . Number of Children: N/A  . Years of Education: N/A   Occupational History  . Not on file.   Social History Main Topics  . Smoking status: Never Smoker   . Smokeless tobacco: Never Used  . Alcohol Use: No  . Drug Use: No  . Sexual Activity: Not Currently   Other Topics Concern  . Not on file   Social History Narrative     Current outpatient prescriptions:  .  chlorpheniramine-HYDROcodone (TUSSIONEX PENNKINETIC ER) 10-8 MG/5ML SUER, Take 5 mLs by mouth every 12 (twelve) hours as needed., Disp: 140 mL, Rfl: 0 .   Cholecalciferol (VITAMIN D) 2000 UNITS tablet, Take 1 tablet by mouth daily. Reported on 06/04/2015, Disp: , Rfl:  .  fluticasone furoate-vilanterol (BREO ELLIPTA) 100-25 MCG/INH AEPB, Inhale 1 puff into the lungs daily., Disp: 60 each, Rfl: 0 .  loratadine (CLARITIN) 10 MG tablet, Take 1 tablet (10 mg total) by mouth daily., Disp: 30 tablet, Rfl: 2 .  Multiple Vitamins-Minerals (MULTIVITAMIN) tablet, Take 1 tablet by mouth daily. (Patient not taking: Reported on 06/04/2015), Disp: 30 tablet, Rfl: 0 .  Omega-3 1000 MG CAPS, Take 1 tablet by mouth daily. Reported on 06/04/2015, Disp: , Rfl:  .  predniSONE (DELTASONE) 20 MG tablet, Take 1 tablet (20 mg total) by mouth daily with breakfast., Disp: 10 tablet, Rfl: 0  No Known Allergies   ROS  Ten systems reviewed and is negative except as mentioned in HPI   Objective  Filed Vitals:   06/04/15 1220  BP: 122/66  Pulse: 89  Temp: 100.1 F (37.8 C)  TempSrc: Oral  Resp: 20  Height: 5\' 5"  (1.651 m)  Weight: 153 lb 14.4 oz (69.809 kg)  SpO2: 97%    Body mass index is 25.61 kg/(m^2).  Physical Exam  Constitutional: Patient appears well-developed and well-nourished. No distress.  HEENT: head atraumatic, normocephalic, pupils equal and reactive to light, ears normal TM bilaterally ,  neck supple,  throat within normal limits Cardiovascular: Normal rate, regular rhythm and normal heart sounds.  No murmur heard. No BLE edema. Pulmonary/Chest: Effort normal , she has some scattered wheezing and rhonchi Abdominal: Soft.  There is no tenderness. Psychiatric: Patient has a normal mood and affect. behavior is normal. Judgment and thought content normal.  PHQ2/9: Depression screen [facility] 2/9 06/04/2015 11/21/2014  Decreased Interest 0 0  Down, Depressed, Hopeless 0 0  PHQ - 2 Score 0 0    Fall Risk: Fall Risk  06/04/2015 11/21/2014  Falls in the past year? No No    Functional Status Survey: Is the patient deaf or have difficulty hearing?:  No Does the patient have difficulty seeing, even when wearing glasses/contacts?: No Does the patient have difficulty concentrating, remembering, or making decisions?: No Does the patient have difficulty walking or climbing stairs?: No Does the patient have difficulty dressing or bathing?: No Does the patient have difficulty doing errands alone such as visiting a doctor's office or shopping?: No    Assessment & Plan  1. Bronchitis  Advised to call me back if worsening of symptoms.  - predniSONE (DELTASONE) 20 MG tablet; Take 1 tablet (20 mg total) by mouth daily with breakfast.  Dispense: 10 tablet; Refill: 0 - chlorpheniramine-HYDROcodone (TUSSIONEX PENNKINETIC ER) 10-8 MG/5ML SUER; Take 5 mLs by mouth every 12 (twelve) hours as needed.  Dispense: 140 mL; Refill: 0 - fluticasone furoate-vilanterol (BREO ELLIPTA) 100-25 MCG/INH AEPB; Inhale 1 puff into the lungs daily.  Dispense: 60 each; Refill: 0 - azithromycin (ZITHROMAX) 250 MG tablet; Take 2 pills first day and one pill daily after that  Dispense: 6 tablet; Refill: 0  2. Other seasonal allergic rhinitis  - loratadine (CLARITIN) 10 MG tablet; Take 1 tablet (10 mg total) by mouth daily.  Dispense: 30 tablet; Refill: 2

## 2015-07-10 ENCOUNTER — Encounter: Payer: Self-pay | Admitting: Family Medicine

## 2015-07-10 ENCOUNTER — Ambulatory Visit (INDEPENDENT_AMBULATORY_CARE_PROVIDER_SITE_OTHER): Payer: Medicare Other | Admitting: Family Medicine

## 2015-07-10 VITALS — BP 122/60 | HR 67 | Temp 98.3°F | Resp 14 | Ht 65.0 in | Wt 156.9 lb

## 2015-07-10 DIAGNOSIS — Z780 Asymptomatic menopausal state: Secondary | ICD-10-CM

## 2015-07-10 DIAGNOSIS — Z1211 Encounter for screening for malignant neoplasm of colon: Secondary | ICD-10-CM | POA: Diagnosis not present

## 2015-07-10 DIAGNOSIS — R32 Unspecified urinary incontinence: Secondary | ICD-10-CM | POA: Diagnosis not present

## 2015-07-10 DIAGNOSIS — E2839 Other primary ovarian failure: Secondary | ICD-10-CM

## 2015-07-10 DIAGNOSIS — H35059 Retinal neovascularization, unspecified, unspecified eye: Secondary | ICD-10-CM | POA: Insufficient documentation

## 2015-07-10 DIAGNOSIS — E785 Hyperlipidemia, unspecified: Secondary | ICD-10-CM | POA: Diagnosis not present

## 2015-07-10 DIAGNOSIS — Z131 Encounter for screening for diabetes mellitus: Secondary | ICD-10-CM

## 2015-07-10 DIAGNOSIS — H35052 Retinal neovascularization, unspecified, left eye: Secondary | ICD-10-CM | POA: Diagnosis not present

## 2015-07-10 DIAGNOSIS — Z1239 Encounter for other screening for malignant neoplasm of breast: Secondary | ICD-10-CM

## 2015-07-10 DIAGNOSIS — Z0000 Encounter for general adult medical examination without abnormal findings: Secondary | ICD-10-CM

## 2015-07-10 NOTE — Progress Notes (Signed)
Name: Meredith Wilcox   MRN: [id number]    DOB: [birth year]   Date:07/10/2015       Progress Note  Subjective  Chief Complaint  Chief Complaint  Patient presents with  . Annual Exam    HPI  Functional ability/safety issues: No Issues Hearing issues: Addressed  Activities of daily living: Discussed Home safety issues: No Issues  End Of Life Planning: Offered verbal information regarding advanced directives, healthcare power of attorney.  Preventative care, Health maintenance, Preventative health measures discussed.  Preventative screenings discussed today: lab work, colonoscopy,  mammogram, DEXA. She refuses all screening  Low Dose CT Chest recommended if Age 107-80 years, 30 pack-year currently smoking OR have quit w/in 15years.   Lifestyle risk factor issued reviewed: Diet, exercise, weight management, advised patient smoking is not healthy, nutrition/diet.  Preventative health measures discussed (5-10 year plan).  Reviewed and recommended vaccinations:  She refuses all shots - Pneumovax  - Prevnar  - Annual Influenza - Zostavax - Tdap   Depression screening: Done Fall risk screening: Done Discuss ADLs/IADLs: Done  Current medical providers: See HPI  Other health risk factors identified this visit: No other issues Cognitive impairment issues: None identified  All above discussed with patient. Appropriate education, counseling and referral will be made based upon the above.   CNVM: she states she has a black spot on the center of her left visual field. Seeing Dr. Jacklynn Ganong at [facility] and had steroid injections and is doing a little better  Urinary Incontinence: she went to PT for pelvic floor training. She learned how to do Kegel exercise. She has noticed some improvement of symptoms.   Dyslipidemia: on diet only, she denies chest pain or decrease exercise tolerance.     Patient Active Problem List   Diagnosis Date Noted  . 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 Dyslipidemia 11/16/2014  . 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 History of Helicobacter pylori infection 11/16/2014  . 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 Incontinence 11/16/2014  . 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 No pertinent past surgical history.  Family History  Problem Relation Age of Onset  . Diabetes Mother   . Cardiomyopathy Sister   . Down syndrome Sister   . Leukemia Brother   . Alcohol abuse Son     youngest son  . Asthma Son     youngest son  . Rheum arthritis Sister   . Drug abuse Child 30    Social History   Social History  . Marital Status: Married    Spouse Name: N/A  . Number of Children: N/A  . Years of Education: N/A   Occupational History  . Not on file.   Social History Main Topics  . Smoking status: Never Smoker   . Smokeless tobacco: Never Used  . Alcohol Use: No  . Drug Use: No  . Sexual Activity: Not Currently   Other Topics Concern  . Not on file   Social History Narrative     Current outpatient prescriptions:  .  Cholecalciferol (VITAMIN D) 2000 UNITS tablet, Take 1 tablet by mouth daily. Reported on 06/04/2015, Disp: , Rfl:  .  loratadine (CLARITIN) 10 MG tablet, Take 1 tablet (10 mg total) by mouth daily. (Patient not taking: Reported on 07/10/2015), Disp: 30 tablet, Rfl: 2 .  Multiple Vitamins-Minerals (MULTIVITAMIN) tablet, Take 1 tablet by mouth daily. (Patient not taking: Reported on 06/04/2015), Disp: 30 tablet, Rfl: 0 .  Omega-3 1000 MG  CAPS, Take 1 tablet by mouth daily. Reported on 06/04/2015, Disp: , Rfl:   Allergies  Allergen Reactions  . Tussionex Pennkinetic Er [Hydrocod Polst-Cpm Polst Er] Other (See Comments)    Real dizzy headed even after a while still walking every direction but straight, patient stated she stopped taking it because of that.     ROS  Constitutional: Negative for fever or weight change.  Respiratory: Negative for cough and shortness of breath.    Cardiovascular: Negative for chest pain or palpitations.  Gastrointestinal: Negative for abdominal pain, no bowel changes.  Musculoskeletal: Negative for gait problem or joint swelling.  Skin: Negative for rash.  Neurological: Negative for dizziness or headache.  No other specific complaints in a complete review of systems (except as listed in HPI above).  Objective  Filed Vitals:   07/10/15 0828  BP: 122/60  Pulse: 67  Temp: 98.3 F (36.8 C)  TempSrc: Oral  Resp: 14  Height: 5\' 5"  (1.651 m)  Weight: 156 lb 14.4 oz (71.169 kg)  SpO2: 99%    Body mass index is 26.11 kg/(m^2).  Physical Exam  Constitutional: Patient appears well-developed and well-nourished. No distress.  HENT: Head: Normocephalic and atraumatic. Ears: B TMs ok, no erythema or effusion; Nose: Nose normal. Mouth/Throat: Oropharynx is clear and moist. No oropharyngeal exudate.  Eyes: Conjunctivae and EOM are normal. Pupils are equal, round, and reactive to light. No scleral icterus.  Neck: Normal range of motion. Neck supple. No JVD present. No thyromegaly present.  Cardiovascular: Normal rate, regular rhythm and normal heart sounds.  No murmur heard. No BLE edema. Pulmonary/Chest: Effort normal and breath sounds normal. No respiratory distress. Abdominal: Soft. Bowel sounds are normal, no distension. There is no tenderness. no masses Breast: no lumps or masses, no nipple discharge or rashes FEMALE GENITALIA:  External genitalia normal External urethra, showed a prolapse urethra Vaginal vault normal without discharge or lesions Cervix normal without discharge or lesions Bimanual exam normal without masses RECTAL: not done Musculoskeletal: Normal range of motion, no joint effusions. No gross deformities Neurological: he is alert and oriented to person, place, and time. No cranial nerve deficit. Coordination, balance, strength, speech and gait are normal.  Skin: Skin is warm and dry. No rash noted. No erythema.   Psychiatric: Patient has a normal mood and affect. behavior is normal. Judgment and thought content normal.  PHQ2/9: Depression screen [facility] 2/9 07/10/2015 06/04/2015 11/21/2014  Decreased Interest 0 0 0  Down, Depressed, Hopeless 0 0 0  PHQ - 2 Score 0 0 0     Fall Risk: Fall Risk  07/10/2015 06/04/2015 11/21/2014  Falls in the past year? No No No     Functional Status Survey: Is the patient deaf or have difficulty hearing?: No Does the patient have difficulty seeing, even when wearing glasses/contacts?: Yes (Has a little trouble seeing street signs until right up on them) Does the patient have difficulty concentrating, remembering, or making decisions?: No Does the patient have difficulty walking or climbing stairs?: No Does the patient have difficulty dressing or bathing?: No Does the patient have difficulty doing errands alone such as visiting a doctor's office or shopping?: No    Assessment & Plan  1. Medicare annual wellness visit, subsequent  Discussed importance of 150 minutes of physical activity weekly, eat two servings of fish weekly, eat one serving of tree nuts ( cashews, pistachios, pecans, almonds.Marland Kitchen) every other day, eat 6 servings of fruit/vegetables daily and drink plenty of water and avoid sweet  beverages.   2. Colon cancer screening  Not due  3. Dyslipidemia  - Lipid panel  4. Post-menopausal  Refused bone density   5. Ovarian failure  Refused bone density  6. Breast cancer screening  refused  7. Diabetes mellitus screening  - Glucose, fasting  8. CNVM (choroidal neovascular membrane), left  Continue follow up at [facility]   9. Incontinence  She states she has been able to control it by not waiting too long to void

## 2015-07-29 DIAGNOSIS — E785 Hyperlipidemia, unspecified: Secondary | ICD-10-CM | POA: Diagnosis not present

## 2015-07-29 DIAGNOSIS — Z131 Encounter for screening for diabetes mellitus: Secondary | ICD-10-CM | POA: Diagnosis not present

## 2015-07-30 LAB — LIPID PANEL
CHOLESTEROL TOTAL: 195 mg/dL (ref 100–199)
Chol/HDL Ratio: 3 ratio units (ref 0.0–4.4)
HDL: 65 mg/dL (ref >39)
LDL Calculated: 104 mg/dL — ABNORMAL HIGH (ref 0–99)
TRIGLYCERIDES: 132 mg/dL (ref 0–149)
VLDL Cholesterol Cal: 26 mg/dL (ref 5–40)

## 2015-07-30 LAB — GLUCOSE, FASTING: Glucose, Plasma: 87 mg/dL (ref 65–99)

## 2015-11-11 DIAGNOSIS — H2513 Age-related nuclear cataract, bilateral: Secondary | ICD-10-CM | POA: Diagnosis not present

## 2015-11-11 DIAGNOSIS — H35052 Retinal neovascularization, unspecified, left eye: Secondary | ICD-10-CM | POA: Diagnosis not present

## 2015-11-11 DIAGNOSIS — H43813 Vitreous degeneration, bilateral: Secondary | ICD-10-CM | POA: Diagnosis not present

## 2015-12-26 DIAGNOSIS — H353221 Exudative age-related macular degeneration, left eye, with active choroidal neovascularization: Secondary | ICD-10-CM | POA: Diagnosis not present

## 2015-12-26 DIAGNOSIS — H3322 Serous retinal detachment, left eye: Secondary | ICD-10-CM | POA: Diagnosis not present

## 2016-02-06 DIAGNOSIS — H353220 Exudative age-related macular degeneration, left eye, stage unspecified: Secondary | ICD-10-CM | POA: Diagnosis not present

## 2016-02-06 DIAGNOSIS — H353221 Exudative age-related macular degeneration, left eye, with active choroidal neovascularization: Secondary | ICD-10-CM | POA: Diagnosis not present

## 2016-04-16 DIAGNOSIS — H2513 Age-related nuclear cataract, bilateral: Secondary | ICD-10-CM | POA: Diagnosis not present

## 2016-04-21 DIAGNOSIS — H2000 Unspecified acute and subacute iridocyclitis: Secondary | ICD-10-CM | POA: Diagnosis not present

## 2016-05-20 ENCOUNTER — Encounter: Payer: Self-pay | Admitting: Family Medicine

## 2016-05-20 ENCOUNTER — Ambulatory Visit (INDEPENDENT_AMBULATORY_CARE_PROVIDER_SITE_OTHER): Payer: Medicare Other | Admitting: Family Medicine

## 2016-05-20 VITALS — BP 140/58 | HR 78 | Temp 98.1°F | Resp 16 | Ht 65.0 in | Wt 155.6 lb

## 2016-05-20 DIAGNOSIS — M25561 Pain in right knee: Secondary | ICD-10-CM | POA: Diagnosis not present

## 2016-05-20 MED ORDER — PREDNISONE 10 MG (48) PO TBPK: ORAL_TABLET | Freq: Every day | ORAL | 0 refills | Status: DC

## 2016-05-20 NOTE — Progress Notes (Signed)
Name: Meredith Wilcox   MRN: [id number]    DOB: [birth year]   Date:05/20/2016       Progress Note  Subjective  Chief Complaint  Chief Complaint  Patient presents with  . Leg Pain    rt leg pain that radiates down from thigh into calf since Sept. no swelling sensitive to touch    HPI  Right knee pain: she states that about 5 months ago she noticed pain on right knee, that is getting progressively worse. Initially it was intermittent pain, but over the past month she has noticed difficulty bearing weight, causing pain when sleeping on left lateral decubitus. She has not noticed any swelling or increase in warmth, the pain now is radiating to upper lateral thigh and down right lateral lower leg. Pain is described as dull aching pain, sometimes throbbing, no tingling or numbness and no back pain. She has tried otc creams, braces without help. She likes to walk and is upset about not being able to be as active now.    Patient Active Problem List   Diagnosis Date Noted  . 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 Dyslipidemia 11/16/2014  . 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 History of Helicobacter pylori infection 11/16/2014  . 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 Incontinence 11/16/2014  . 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 No pertinent surgical history.  Family History  Problem Relation Age of Onset  . Diabetes Mother   . Cardiomyopathy Sister   . Down syndrome Sister   . Leukemia Brother   . Alcohol abuse Son     youngest son  . Asthma Son     youngest son  . Rheum arthritis Sister   . Drug abuse Child 58    Social History   Social History  . Marital status: Married    Spouse name: N/A  . Number of children: N/A  . Years of education: N/A   Occupational History  . Not on file.   Social History Main Topics  . Smoking status: Never Smoker  . Smokeless tobacco: Never Used  . Alcohol use No  . Drug use: No  . Sexual  activity: Not Currently   Other Topics Concern  . Not on file   Social History Narrative  . No narrative on file     Current Outpatient Prescriptions:  .  Cholecalciferol (VITAMIN D) 2000 UNITS tablet, Take 1 tablet by mouth daily. Reported on 06/04/2015, Disp: , Rfl:  .  Multiple Vitamins-Minerals (MULTIVITAMIN) tablet, Take 1 tablet by mouth daily., Disp: 30 tablet, Rfl: 0 .  Omega-3 1000 MG CAPS, Take 1 tablet by mouth daily. Reported on 06/04/2015, Disp: , Rfl:  .  cyclopentolate (CYCLODRYL,CYCLOGYL) 1 % ophthalmic solution, , Disp: , Rfl:  .  loratadine (CLARITIN) 10 MG tablet, Take 1 tablet (10 mg total) by mouth daily. (Patient not taking: Reported on 07/10/2015), Disp: 30 tablet, Rfl: 2  Allergies  Allergen Reactions  . Tussionex Pennkinetic Er [Hydrocod Polst-Cpm Polst Er] Other (See Comments)    Real dizzy headed even after a while still walking every direction but straight, patient stated she stopped taking it because of that.  . Hydrocodone     Light headed and dizziness     ROS  Ten systems reviewed and is negative except as mentioned in HPI  She has paresthesia on right lateral foot since the birth of her last child 34 years ago, no changes  Objective  Vitals:  05/20/16 0814  BP: (!) 140/58  Pulse: 78  Resp: 16  Temp: 98.1 F (36.7 C)  SpO2: 99%  Weight: 155 lb 9 oz (70.6 kg)  Height: 5\' 5"  (1.651 m)    Body mass index is 25.89 kg/m.  Physical Exam  Constitutional: Patient appears well-developed and well-nourished. Obese  No distress.  HEENT: head atraumatic, normocephalic, pupils equal and reactive to light, neck supple, throat within normal limits Cardiovascular: Normal rate, regular rhythm and normal heart sounds.  No murmur heard. No BLE edema. Pulmonary/Chest: Effort normal and breath sounds normal. No respiratory distress. Abdominal: Soft.  There is no tenderness. Psychiatric: Patient has a normal mood and affect. behavior is normal. Judgment  and thought content normal. Muscular Skeletal: she has mild effusion of right knee, no redness or increase in warmth, pain during McMurray's on right lateral side. She has difficulty bearing weight. Negative straight leg raise, normal exam of both hips. Decrease rom of knee, difficulty extending all the way secondary to effusion    PHQ2/9: Depression screen [facility] 2/9 07/10/2015 06/04/2015 11/21/2014  Decreased Interest 0 0 0  Down, Depressed, Hopeless 0 0 0  PHQ - 2 Score 0 0 0     Fall Risk: Fall Risk  07/10/2015 06/04/2015 11/21/2014  Falls in the past year? No No No     Assessment & Plan  1. Lateral knee pain, right  Possible meniscal tear, she does wants to hold off on steroid injection or referral to Ortho at this time/ Discussed X-ray and may possibly need MRI. She would like to try medication, and is willing to try prednisone taper, discussed possible side effects. Hopefully prednisone will help with inflammation, swelling and pain.   - predniSONE (STERAPRED UNI-PAK 48 TAB) 10 MG (48) TBPK tablet; Take by mouth daily. Take as directed  Dispense: 48 tablet; Refill: 0

## 2016-06-03 ENCOUNTER — Ambulatory Visit
Admission: RE | Admit: 2016-06-03 | Discharge: 2016-06-03 | Disposition: A | Discharge status: Home / Self Care | Payer: Medicare Other | Source: Ambulatory Visit | Attending: Family Medicine | Admitting: Family Medicine

## 2016-06-03 ENCOUNTER — Encounter: Payer: Self-pay | Admitting: Family Medicine

## 2016-06-03 ENCOUNTER — Ambulatory Visit (INDEPENDENT_AMBULATORY_CARE_PROVIDER_SITE_OTHER): Payer: Medicare Other | Admitting: Family Medicine

## 2016-06-03 VITALS — BP 134/86 | HR 91 | Temp 98.6°F | Resp 16 | Wt 154.6 lb

## 2016-06-03 DIAGNOSIS — R682 Dry mouth, unspecified: Secondary | ICD-10-CM | POA: Diagnosis not present

## 2016-06-03 DIAGNOSIS — R059 Cough, unspecified: Secondary | ICD-10-CM

## 2016-06-03 DIAGNOSIS — R05 Cough: Secondary | ICD-10-CM | POA: Diagnosis not present

## 2016-06-03 LAB — GLUCOSE, POCT (MANUAL RESULT ENTRY): POC GLUCOSE: 78 mg/dL (ref 70–99)

## 2016-06-03 MED ORDER — BENZONATATE 100 MG PO CAPS: 100.0000 mg | ORAL_CAPSULE | Freq: Three times a day (TID) | ORAL | 0 refills | Status: AC | PRN

## 2016-06-03 MED ORDER — AZITHROMYCIN 250 MG PO TABS: ORAL_TABLET | ORAL | 0 refills | Status: AC

## 2016-06-03 NOTE — Patient Instructions (Signed)
Please go across the street to have the chest xray done Start the antibiotics Use the new pills for cough if needed Try vitamin C (orange juice if not diabetic or vitamin C tablets) and drink green tea to help your immune system during your illness Get plenty of rest and hydration Go to the ER if needed Call if we may help in any way

## 2016-06-03 NOTE — Progress Notes (Signed)
BP 134/86   Pulse 91   Temp 98.6 F (37 C) (Oral)   Resp 16   Wt 154 lb 9.6 oz (70.1 kg)   SpO2 95%   BMI 25.73 kg/m    Subjective:    Patient ID: Meredith Wilcox, female    DOB: 1940-05-04, 76 y.o.   MRN: [id number]  HPI: Meredith Wilcox is a 76 y.o. female  Chief Complaint  Patient presents with  . URI    onset 10 days symptoms include: cough, congestion, chest tightness and runny nose   Patient is new to me, here for a sick visit She has been coughing for 10 days; very barky and awful; gets coughing jags and really deep No fever; chest tightness She has had bronchitis in the past, no hx of pneumonia Mouth is chalky and dry; she is on prednisone for her knee; she took the prednisone, last day was Tuesday of the 12 day taper; definitely better (knee); swelling and inflammation went away She does not have diabetes  Depression screen [facility] 2/9 06/03/2016 07/10/2015 06/04/2015 11/21/2014  Decreased Interest 0 0 0 0  Down, Depressed, Hopeless 0 0 0 0  PHQ - 2 Score 0 0 0 0   Relevant past medical, surgical, family and social history reviewed Past Medical History:  Diagnosis Date  . Geographic tongue   . Helicobacter pylori gastrointestinal tract infection   . Hyperlipidemia   . Incontinence   . Indigestion   . Prolapse of vaginal walls   . Right sided sciatica   . Urethral prolapse    History reviewed. No pertinent surgical history.   Family History  Problem Relation Age of Onset  . Diabetes Mother   . Cardiomyopathy Sister   . Down syndrome Sister   . Leukemia Brother   . Alcohol abuse Son     youngest son  . Asthma Son     youngest son  . Rheum arthritis Sister   . Drug abuse Child 31   Social History  Substance Use Topics  . Smoking status: Never Smoker  . Smokeless tobacco: Never Used  . Alcohol use No   Interim medical history since last visit reviewed. Allergies and medications reviewed  Review of Systems Per HPI unless specifically indicated above       Objective:    BP 134/86   Pulse 91   Temp 98.6 F (37 C) (Oral)   Resp 16   Wt 154 lb 9.6 oz (70.1 kg)   SpO2 95%   BMI 25.73 kg/m   Wt Readings from Last 3 Encounters:  06/03/16 154 lb 9.6 oz (70.1 kg)  05/20/16 155 lb 9 oz (70.6 kg)  07/10/15 156 lb 14.4 oz (71.2 kg)    Physical Exam  Constitutional: She appears well-developed and well-nourished.  HENT:  Right Ear: Hearing, external ear and ear canal normal. Tympanic membrane is not erythematous. A middle ear effusion is present.  Left Ear: Hearing, external ear and ear canal normal. Tympanic membrane is not erythematous. A middle ear effusion is present.  Nose: Rhinorrhea (clear) present.  Mouth/Throat: Oropharynx is clear and moist and mucous membranes are normal.  Eyes: EOM are normal. No scleral icterus.  Cardiovascular: Normal rate and regular rhythm.   Pulmonary/Chest: Effort normal. No respiratory distress. She has no decreased breath sounds. She has no wheezes. She has rhonchi (scattered).  Occasionally coarse, spastic cough  Lymphadenopathy:    She has cervical adenopathy (shoddy).       Right: No  supraclavicular adenopathy present.       Left: No supraclavicular adenopathy present.  Skin: She is not diaphoretic. No pallor.  Psychiatric: She has a normal mood and affect. Her behavior is normal. Her mood appears not anxious. She does not exhibit a depressed mood.  Very pleasant, cooperative      Assessment & Plan:   Problem List Items Addressed This Visit    None    Visit Diagnoses    Cough    -  Primary   likely bronchitis, but will get CXR to r/o pneumonia; rest, hydration, reasons to go to urgent care or ER reviewed; pt agrees   Relevant Orders   DG Chest 2 View (Completed)   Dry mouth       check glucose to r/o hyperglycemia with recent prednisone administration; it was normal today   Relevant Orders   POCT Glucose (CBG) (Completed)       Follow up plan: No Follow-up on file.  An after-visit  summary was printed and given to the patient at [facility].  Please see the patient instructions which may contain other information and recommendations beyond what is mentioned above in the assessment and plan.  Meds ordered this encounter  Medications  . azithromycin (ZITHROMAX) 250 MG tablet    Sig: Two pills by mouth today, then one pill daily for four more days    Dispense:  6 tablet    Refill:  0  . benzonatate (TESSALON PERLES) 100 MG capsule    Sig: Take 1 capsule (100 mg total) by mouth every 8 (eight) hours as needed for cough.    Dispense:  30 capsule    Refill:  0    Orders Placed This Encounter  Procedures  . DG Chest 2 View  . POCT Glucose (CBG)

## 2016-06-04 DIAGNOSIS — H2000 Unspecified acute and subacute iridocyclitis: Secondary | ICD-10-CM | POA: Diagnosis not present

## 2016-06-11 DIAGNOSIS — H3322 Serous retinal detachment, left eye: Secondary | ICD-10-CM | POA: Diagnosis not present

## 2016-06-17 ENCOUNTER — Encounter: Payer: Self-pay | Admitting: Family Medicine

## 2016-06-17 ENCOUNTER — Ambulatory Visit (INDEPENDENT_AMBULATORY_CARE_PROVIDER_SITE_OTHER): Payer: Medicare Other | Admitting: Family Medicine

## 2016-06-17 VITALS — BP 146/62 | HR 89 | Temp 98.1°F | Resp 16 | Ht 65.0 in | Wt 152.4 lb

## 2016-06-17 DIAGNOSIS — M25561 Pain in right knee: Secondary | ICD-10-CM

## 2016-06-17 DIAGNOSIS — R030 Elevated blood-pressure reading, without diagnosis of hypertension: Secondary | ICD-10-CM | POA: Diagnosis not present

## 2016-06-17 DIAGNOSIS — R062 Wheezing: Secondary | ICD-10-CM | POA: Diagnosis not present

## 2016-06-17 MED ORDER — FLUTICASONE FUROATE-VILANTEROL 100-25 MCG/INH IN AEPB: 1.0000 | INHALATION_SPRAY | Freq: Every day | RESPIRATORY_TRACT | 0 refills | Status: DC

## 2016-06-17 NOTE — Progress Notes (Signed)
Name: Meredith Wilcox   MRN: [id number]    DOB: 1941/04/01   Date:06/17/2016       Progress Note  Subjective  Chief Complaint  Chief Complaint  Patient presents with  . Follow-up    1 month F/U  . Knee Pain    HPI  Right knee pain: she states that about 6 months ago she noticed pain on right knee, that was getting progressively worse. Initially it was intermittent pain, but than she  noticed difficulty bearing weight, causing pain when sleeping on left lateral decubitus. She has not noticed any swelling or increase in warmth, when the pain started to radiate to upper lateral thigh and down right lateral lower leg she decided to come in to be seen ( Feb 2018 ). We  Pain was aching, we talked about options and gave her prednisone taper, her pain resolved but she still has stiffness, but feels much better.   Cough: she was seen by Dr. Sanda Klein a few weeks ago and diagnosed with URI, CXR was negative, cough has improved with Z-pack and tessalon perles, but she still has chest congestion, a mild cough, and some tightness.    Patient Active Problem List   Diagnosis Date Noted  . 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 History of Helicobacter pylori infection 11/16/2014  . 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 Incontinence 11/16/2014  . 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 No pertinent surgical history.  Family History  Problem Relation Age of Onset  . Diabetes Mother   . Cardiomyopathy Sister   . Down syndrome Sister   . Leukemia Brother   . Alcohol abuse Son     youngest son  . Asthma Son     youngest son  . Rheum arthritis Sister   . Drug abuse Child 13    Social History   Social History  . Marital status: Married    Spouse name: N/A  . Number of children: N/A  . Years of education: N/A   Occupational History  . Not on file.   Social History Main Topics  . Smoking status: Never Smoker  .  Smokeless tobacco: Never Used  . Alcohol use No  . Drug use: No  . Sexual activity: Not Currently   Other Topics Concern  . Not on file   Social History Narrative  . No narrative on file     Current Outpatient Prescriptions:  .  Cholecalciferol (VITAMIN D) 2000 UNITS tablet, Take 1 tablet by mouth daily. Reported on 06/04/2015, Disp: , Rfl:  .  Multiple Vitamins-Minerals (MULTIVITAMIN) tablet, Take 1 tablet by mouth daily., Disp: 30 tablet, Rfl: 0 .  Omega-3 1000 MG CAPS, Take 1 tablet by mouth daily. Reported on 06/04/2015, Disp: , Rfl:   Allergies  Allergen Reactions  . Tussionex Pennkinetic Er [Hydrocod Polst-Cpm Polst Er] Other (See Comments)    Real dizzy headed even after a while still walking every direction but straight, patient stated she stopped taking it because of that.  . Hydrocodone     Light headed and dizziness     ROS  Constitutional: Negative for fever or weight change.  Respiratory: Positive  for cough and shortness of breath.   Cardiovascular: Negative for chest pain or palpitations.  Gastrointestinal: Negative for abdominal pain, no bowel changes.  Musculoskeletal: Negative for gait problem or joint swelling.  Skin: Negative for rash.  Neurological: Negative for dizziness or headache.  No other specific complaints in a complete review of systems (except as listed in HPI above).  Objective  Vitals:   06/17/16 1030  BP: (!) 146/62  Pulse: 89  Resp: 16  Temp: 98.1 F (36.7 C)  SpO2: 97%  Weight: 152 lb 7 oz (69.1 kg)  Height: 5\' 5"  (1.651 m)    Body mass index is 25.37 kg/m.  Physical Exam  Constitutional: Patient appears well-developed and well-nourished. Obese No distress.  HEENT: head atraumatic, normocephalic, pupils equal and reactive to light,neck supple, throat within normal limits Cardiovascular: Normal rate, regular rhythm and normal heart sounds.  No murmur heard. No BLE edema. Pulmonary/Chest: Effort normal and breath sounds  showed wheezing. No respiratory distress. Abdominal: Soft.  There is no tenderness. Psychiatric: Patient has a normal mood and affect. behavior is normal. Judgment and thought content normal. Muscular Skeletal: normal rom of knees, no effusion   Recent Results (from the past 2160 hour(s))  POCT Glucose (CBG)     Status: Normal   Collection Time: 06/03/16  3:05 PM  Result Value Ref Range   POC Glucose 78 70 - 99 mg/dl     PHQ2/9: Depression screen [facility] 2/9 06/17/2016 06/03/2016 07/10/2015 06/04/2015 11/21/2014  Decreased Interest 0 0 0 0 0  Down, Depressed, Hopeless 0 0 0 0 0  PHQ - 2 Score 0 0 0 0 0     Fall Risk: Fall Risk  06/17/2016 06/03/2016 07/10/2015 06/04/2015 11/21/2014  Falls in the past year? No No No No No     Functional Status Survey: Is the patient deaf or have difficulty hearing?: No Does the patient have difficulty seeing, even when wearing glasses/contacts?: Yes (glasses) Does the patient have difficulty concentrating, remembering, or making decisions?: No Does the patient have difficulty walking or climbing stairs?: No Does the patient have difficulty dressing or bathing?: No Does the patient have difficulty doing errands alone such as visiting a doctor's office or shopping?: No    Assessment & Plan  1. Lateral knee pain, right  Doing better after prednisone taper, but has stiffness, we will try home exercises, she would like to hold off on referral to PT or Ortho at this time  2. Wheezing  Likely RAD from recent URI, we will try Breo, return for spirometry if no improvement - fluticasone furoate-vilanterol (BREO ELLIPTA) 100-25 MCG/INH AEPB; Inhale 1 puff into the lungs daily.  Dispense: 60 each; Refill: 0  3. Elevated blood pressure reading  She was rushing before she arrived, we will recheck before she leave

## 2016-06-17 NOTE — Patient Instructions (Signed)
Knee Exercises Ask your health care provider which exercises are safe for you. Do exercises exactly as told by your health care provider and adjust them as directed. It is normal to feel mild stretching, pulling, tightness, or discomfort as you do these exercises, but you should stop right away if you feel sudden pain or your pain gets worse.Do not begin these exercises until told by your health care provider. STRETCHING AND RANGE OF MOTION EXERCISES  These exercises warm up your muscles and joints and improve the movement and flexibility of your knee. These exercises also help to relieve pain, numbness, and tingling. Exercise A: Knee Extension, Prone  1. Lie on your abdomen on a bed. 2. Place your left / right knee just beyond the edge of the surface so your knee is not on the bed. You can put a towel under your left / right thigh just above your knee for comfort. 3. Relax your leg muscles and allow gravity to straighten your knee. You should feel a stretch behind your left / right knee. 4. Hold this position for __________ seconds. 5. Scoot up so your knee is supported between repetitions. Repeat __________ times. Complete this stretch __________ times a day. Exercise B: Knee Flexion, Active   1. Lie on your back with both knees straight. If this causes back discomfort, bend your left / right knee so your foot is flat on the floor. 2. Slowly slide your left / right heel back toward your buttocks until you feel a gentle stretch in the front of your knee or thigh. 3. Hold this position for __________ seconds. 4. Slowly slide your left / right heel back to the starting position. Repeat __________ times. Complete this exercise __________ times a day. Exercise C: Quadriceps, Prone   1. Lie on your abdomen on a firm surface, such as a bed or padded floor. 2. Bend your left / right knee and hold your ankle. If you cannot reach your ankle or pant leg, loop a belt around your foot and grab the belt  instead. 3. Gently pull your heel toward your buttocks. Your knee should not slide out to the side. You should feel a stretch in the front of your thigh and knee. 4. Hold this position for __________ seconds. Repeat __________ times. Complete this stretch __________ times a day. Exercise D: Hamstring, Supine  1. Lie on your back. 2. Loop a belt or towel over the ball of your left / right foot. The ball of your foot is on the walking surface, right under your toes. 3. Straighten your left / right knee and slowly pull on the belt to raise your leg until you feel a gentle stretch behind your knee.  Do not let your left / right knee bend while you do this.  Keep your other leg flat on the floor. 4. Hold this position for __________ seconds. Repeat __________ times. Complete this stretch __________ times a day. STRENGTHENING EXERCISES  These exercises build strength and endurance in your knee. Endurance is the ability to use your muscles for a long time, even after they get tired. Exercise E: Quadriceps, Isometric   1. Lie on your back with your left / right leg extended and your other knee bent. Put a rolled towel or small pillow under your knee if told by your health care provider. 2. Slowly tense the muscles in the front of your left / right thigh. You should see your kneecap slide up toward your hip or see  increased dimpling just above the knee. This motion will push the back of the knee toward the floor. 3. For __________ seconds, keep the muscle as tight as you can without increasing your pain. 4. Relax the muscles slowly and completely. Repeat __________ times. Complete this exercise __________ times a day. Exercise F: Straight Leg Raises - Quadriceps  1. Lie on your back with your left / right leg extended and your other knee bent. 2. Tense the muscles in the front of your left / right thigh. You should see your kneecap slide up or see increased dimpling just above the knee. Your thigh  may even shake a bit. 3. Keep these muscles tight as you raise your leg 4-6 inches (10-15 cm) off the floor. Do not let your knee bend. 4. Hold this position for __________ seconds. 5. Keep these muscles tense as you lower your leg. 6. Relax your muscles slowly and completely after each repetition. Repeat __________ times. Complete this exercise __________ times a day. Exercise G: Hamstring, Isometric  1. Lie on your back on a firm surface. 2. Bend your left / right knee approximately __________ degrees. 3. Dig your left / right heel into the surface as if you are trying to pull it toward your buttocks. Tighten the muscles in the back of your thighs to dig as hard as you can without increasing any pain. 4. Hold this position for __________ seconds. 5. Release the tension gradually and allow your muscles to relax completely for __________ seconds after each repetition. Repeat __________ times. Complete this exercise __________ times a day. Exercise H: Hamstring Curls   If told by your health care provider, do this exercise while wearing ankle weights. Begin with __________ weights. Then increase the weight by 1 lb (0.5 kg) increments. Do not wear ankle weights that are more than __________. 1. Lie on your abdomen with your legs straight. 2. Bend your left / right knee as far as you can without feeling pain. Keep your hips flat against the floor. 3. Hold this position for __________ seconds. 4. Slowly lower your leg to the starting position. Repeat __________ times. Complete this exercise __________ times a day. Exercise I: Squats (Quadriceps)  1. Stand in front of a table, with your feet and knees pointing straight ahead. You may rest your hands on the table for balance but not for support. 2. Slowly bend your knees and lower your hips like you are going to sit in a chair.  Keep your weight over your heels, not over your toes.  Keep your lower legs upright so they are parallel with the  table legs.  Do not let your hips go lower than your knees.  Do not bend lower than told by your health care provider.  If your knee pain increases, do not bend as low. 3. Hold the squat position for __________ seconds. 4. Slowly push with your legs to return to standing. Do not use your hands to pull yourself to standing. Repeat __________ times. Complete this exercise __________ times a day. Exercise J: Wall Slides (Quadriceps)   1. Lean your back against a smooth wall or door while you walk your feet out 18-24 inches (46-61 cm) from it. 2. Place your feet hip-width apart. 3. Slowly slide down the wall or door until your knees Repeat __________ times. Complete this exercise __________ times a day. 4. Exercise K: Straight Leg Raises - Hip Abductors  1. Lie on your side with your left / right leg   in the top position. Lie so your head, shoulder, knee, and hip line up. You may bend your bottom knee to help you keep your balance. 2. Roll your hips slightly forward so your hips are stacked directly over each other and your left / right knee is facing forward. 3. Leading with your heel, lift your top leg 4-6 inches (10-15 cm). You should feel the muscles in your outer hip lifting.  Do not let your foot drift forward.  Do not let your knee roll toward the ceiling. 4. Hold this position for __________ seconds. 5. Slowly return your leg to the starting position. 6. Let your muscles relax completely after each repetition. Repeat __________ times. Complete this exercise __________ times a day. Exercise L: Straight Leg Raises - Hip Extensors  1. Lie on your abdomen on a firm surface. You can put a pillow under your hips if that is more comfortable. 2. Tense the muscles in your buttocks and lift your left / right leg about 4-6 inches (10-15 cm). Keep your knee straight as you lift your leg. 3. Hold this position for __________ seconds. 4. Slowly lower your leg to the starting position. 5. Let  your leg relax completely after each repetition. Repeat __________ times. Complete this exercise __________ times a day. This information is not intended to replace advice given to you by your health care provider. Make sure you discuss any questions you have with your health care provider. Document Released: 02/10/2005 Document Revised: 12/22/2015 Document Reviewed: 02/02/2015 Elsevier Interactive Patient Education  2017 [employer]  

## 2016-06-18 ENCOUNTER — Other Ambulatory Visit: Payer: Self-pay | Admitting: Family Medicine

## 2016-06-18 DIAGNOSIS — R062 Wheezing: Secondary | ICD-10-CM

## 2016-06-21 ENCOUNTER — Telehealth: Payer: Self-pay

## 2016-06-21 ENCOUNTER — Other Ambulatory Visit: Payer: Self-pay | Admitting: Family Medicine

## 2016-06-21 NOTE — Telephone Encounter (Signed)
No records of Metformin

## 2016-06-21 NOTE — Telephone Encounter (Signed)
Called pharmacy and they do see any refills on patient chart. Called patient but she did not answer.

## 2016-06-21 NOTE — Telephone Encounter (Signed)
Patient requesting refill of Metformin to [facility].

## 2016-07-08 DIAGNOSIS — H2511 Age-related nuclear cataract, right eye: Secondary | ICD-10-CM | POA: Diagnosis not present

## 2016-07-12 ENCOUNTER — Encounter: Payer: Medicare Other | Admitting: Family Medicine

## 2016-07-28 ENCOUNTER — Other Ambulatory Visit: Payer: Self-pay | Admitting: Family Medicine

## 2016-07-28 ENCOUNTER — Encounter: Payer: Self-pay | Admitting: Family Medicine

## 2016-07-28 ENCOUNTER — Ambulatory Visit (INDEPENDENT_AMBULATORY_CARE_PROVIDER_SITE_OTHER): Payer: Medicare Other | Admitting: Family Medicine

## 2016-07-28 VITALS — BP 132/64 | HR 83 | Temp 98.3°F | Resp 16 | Ht 65.0 in | Wt 151.2 lb

## 2016-07-28 DIAGNOSIS — N8189 Other female genital prolapse: Secondary | ICD-10-CM

## 2016-07-28 DIAGNOSIS — Z0000 Encounter for general adult medical examination without abnormal findings: Secondary | ICD-10-CM

## 2016-07-28 DIAGNOSIS — N3941 Urge incontinence: Secondary | ICD-10-CM | POA: Diagnosis not present

## 2016-07-28 DIAGNOSIS — Z1231 Encounter for screening mammogram for malignant neoplasm of breast: Secondary | ICD-10-CM | POA: Diagnosis not present

## 2016-07-28 DIAGNOSIS — Z0001 Encounter for general adult medical examination with abnormal findings: Secondary | ICD-10-CM | POA: Diagnosis not present

## 2016-07-28 DIAGNOSIS — N368 Other specified disorders of urethra: Secondary | ICD-10-CM | POA: Diagnosis not present

## 2016-07-28 NOTE — Progress Notes (Signed)
Name: Meredith Wilcox   MRN: [id number]    DOB: [birth year]   Date:07/28/2016       Progress Note  Subjective  Chief Complaint  Chief Complaint  Patient presents with  . Annual Exam    HPI  Functional ability/safety issues: No Issues Hearing issues: Addressed  Activities of daily living: Discussed Home safety issues: No Issues  End Of Life Planning: Offered verbal information regarding advanced directives, healthcare power of attorney.  Preventative care, Health maintenance, Preventative health measures discussed.  Preventative screenings discussed today: lab work, colonoscopy,  mammogram, DEXA.  Low Dose CT Chest recommended if Age 52-80 years, 30 pack-year currently smoking OR have quit w/in 15years.   Lifestyle risk factor issued reviewed: Diet, exercise, weight management, advised patient smoking is not healthy, nutrition/diet.  Preventative health measures discussed (5-10 year plan).  Reviewed and recommended vaccinations: refuses all immunizations - Pneumovax  - Prevnar  - Annual Influenza - Zostavax - Tdap   Depression screening: Done Fall risk screening: Done Discuss ADLs/IADLs: Done  Current medical providers: See HPI  Other health risk factors identified this visit: No other issues Cognitive impairment issues: None identified  All above discussed with patient. Appropriate education, counseling and referral will be made based upon the above.   Pelvic Floor relaxation: she states she does not have stress incontinence, but she has urinary urgency and if she does not go right away she has incontinence ( needs to change her clothe ). She states she feels like something is prolapsing and it is uncomfortable to walk. She states she has tried PT in the past, she is willing to see Urologist now.   Allergic Rhinitis: she has a dry cough and nasal congestion, taking otc medication with mild improvement. Does not want further evaluation  Patient Active Problem List    Diagnosis Date Noted  . 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 History of Helicobacter pylori infection 11/16/2014  . 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 Incontinence 11/16/2014  . 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 No pertinent surgical history.  Family History  Problem Relation Age of Onset  . Diabetes Mother   . Cardiomyopathy Sister   . Down syndrome Sister   . Leukemia Brother   . Alcohol abuse Son     youngest son  . Asthma Son     youngest son  . Rheum arthritis Sister   . Drug abuse Child 80    Social History   Social History  . Marital status: Married    Spouse name: N/A  . Number of children: N/A  . Years of education: N/A   Occupational History  . Not on file.   Social History Main Topics  . Smoking status: Never Smoker  . Smokeless tobacco: Never Used  . Alcohol use No  . Drug use: No  . Sexual activity: Not Currently   Other Topics Concern  . Not on file   Social History Narrative  . No narrative on file     Current Outpatient Prescriptions:  .  Cholecalciferol (VITAMIN D) 2000 UNITS tablet, Take 1 tablet by mouth daily. Reported on 06/04/2015, Disp: , Rfl:  .  Multiple Vitamins-Minerals (MULTIVITAMIN) tablet, Take 1 tablet by mouth daily., Disp: 30 tablet, Rfl: 0 .  Omega-3 1000 MG CAPS, Take 1 tablet by mouth daily. Reported on 06/04/2015, Disp: , Rfl:   Allergies  Allergen Reactions  . Tussionex Pennkinetic Er [employer]  Polst-Cpm Polst Er] Other (See Comments)    Real dizzy headed even after a while still walking every direction but straight, patient stated she stopped taking it because of that.  . Hydrocodone     Light headed and dizziness     ROS  Constitutional: Negative for fever or weight change.  Respiratory: Positive  for dry cough ( she states secondary to allergies - but does not want medication and refuses further testing ) no shortness  of breath.   Cardiovascular: Negative for chest pain or palpitations.  Gastrointestinal: Negative for abdominal pain, no bowel changes.  Musculoskeletal: Negative for gait problem or joint swelling.  Skin: Negative for rash.  Neurological: Negative for dizziness or headache.  No other specific complaints in a complete review of systems (except as listed in HPI above).  Objective  Vitals:   07/28/16 0946  BP: 132/64  Pulse: 83  Resp: 16  Temp: 98.3 F (36.8 C)  SpO2: 99%  Weight: 151 lb 4 oz (68.6 kg)  Height: 5\' 5"  (1.651 m)    Body mass index is 25.17 kg/m.  Physical Exam  Constitutional: Patient appears well-developed and well-nourished. No distress.  HENT: Head: Normocephalic and atraumatic. Ears: B TMs ok, no erythema or effusion; Nose: Nose normal. Mouth/Throat: Oropharynx is clear and moist. No oropharyngeal exudate.  Eyes: Conjunctivae and EOM are normal. Pupils are equal, round, and reactive to light. No scleral icterus.  Neck: Normal range of motion. Neck supple. No JVD present. No thyromegaly present.  Cardiovascular: Normal rate, regular rhythm and normal heart sounds.  No murmur heard. No BLE edema. Pulmonary/Chest: Effort normal and breath sounds normal. No respiratory distress. Abdominal: Soft. Bowel sounds are normal, no distension. There is no tenderness. no masses Breast: no lumps or masses, no nipple discharge or rashes FEMALE GENITALIA:  External genitalia normal External urethra - urethral prolapse Vaginal vault normal without discharge or lesions Cystocele and rectocele Uterus normal in size RECTAL: not done Musculoskeletal: Normal range of motion, no joint effusions. No gross deformities Neurological: he is alert and oriented to person, place, and time. No cranial nerve deficit. Coordination, balance, strength, speech and gait are normal.  Skin: Skin is warm and dry. No rash noted. No erythema.  Psychiatric: Patient has a normal mood and affect.  behavior is normal. Judgment and thought content normal.  Recent Results (from the past 2160 hour(s))  POCT Glucose (CBG)     Status: Normal   Collection Time: 06/03/16  3:05 PM  Result Value Ref Range   POC Glucose 78 70 - 99 mg/dl      PHQ2/9: Depression screen [facility] 2/9 07/28/2016 06/17/2016 06/03/2016 07/10/2015 06/04/2015  Decreased Interest 0 0 0 0 0  Down, Depressed, Hopeless 0 0 0 0 0  PHQ - 2 Score 0 0 0 0 0     Fall Risk: Fall Risk  07/28/2016 06/17/2016 06/03/2016 07/10/2015 06/04/2015  Falls in the past year? No No No No No    Functional Status Survey: Is the patient deaf or have difficulty hearing?: No Does the patient have difficulty seeing, even when wearing glasses/contacts?: Yes Does the patient have difficulty concentrating, remembering, or making decisions?: No Does the patient have difficulty walking or climbing stairs?: No Does the patient have difficulty dressing or bathing?: No Does the patient have difficulty doing errands alone such as visiting a doctor's office or shopping?: No    Assessment & Plan  1. Medicare annual wellness visit, subsequent  Discussed importance of 150  minutes of physical activity weekly, eat two servings of fish weekly, eat one serving of tree nuts ( cashews, pistachios, pecans, almonds.Marland Kitchen) every other day, eat 6 servings of fruit/vegetables daily and drink plenty of water and avoid sweet beverages.  She refuses immunizations and colonoscopy   2. Pelvic floor relaxation  - Ambulatory referral to Urology  3. Prolapse of urethra  - Ambulatory referral to Urology  4. Urge incontinence of urine  - Ambulatory referral to Urology  5. Encounter for screening mammogram for breast cancer  - mammogram screen

## 2016-07-29 ENCOUNTER — Telehealth: Payer: Self-pay | Admitting: Family Medicine

## 2016-07-29 NOTE — Telephone Encounter (Signed)
Pt husband wanting to talk with you about a referral

## 2016-07-29 NOTE — Telephone Encounter (Signed)
Patient was informed that she has been referred to see Dr. Hollice Espy with [facility]

## 2016-08-13 ENCOUNTER — Ambulatory Visit: Payer: Medicare Other | Admitting: Urology

## 2016-08-19 ENCOUNTER — Ambulatory Visit: Payer: Medicare Other

## 2016-09-01 ENCOUNTER — Ambulatory Visit
Admission: RE | Admit: 2016-09-01 | Discharge: 2016-09-01 | Disposition: A | Discharge status: Home / Self Care | Payer: Medicare Other | Source: Ambulatory Visit | Attending: Family Medicine | Admitting: Family Medicine

## 2016-09-01 DIAGNOSIS — Z1231 Encounter for screening mammogram for malignant neoplasm of breast: Secondary | ICD-10-CM | POA: Insufficient documentation

## 2016-09-02 ENCOUNTER — Other Ambulatory Visit: Payer: Self-pay | Admitting: Family Medicine

## 2016-09-02 DIAGNOSIS — N6320 Unspecified lump in the left breast, unspecified quadrant: Secondary | ICD-10-CM

## 2016-09-02 DIAGNOSIS — R928 Other abnormal and inconclusive findings on diagnostic imaging of breast: Secondary | ICD-10-CM

## 2016-09-10 ENCOUNTER — Other Ambulatory Visit: Payer: Self-pay | Admitting: Family Medicine

## 2016-09-10 ENCOUNTER — Ambulatory Visit: Payer: Medicare Other | Admitting: Urology

## 2016-09-10 DIAGNOSIS — N6320 Unspecified lump in the left breast, unspecified quadrant: Secondary | ICD-10-CM

## 2016-09-10 DIAGNOSIS — R928 Other abnormal and inconclusive findings on diagnostic imaging of breast: Secondary | ICD-10-CM

## 2016-09-24 ENCOUNTER — Ambulatory Visit
Admission: RE | Admit: 2016-09-24 | Discharge: 2016-09-24 | Disposition: A | Discharge status: Home / Self Care | Payer: Medicare Other | Source: Ambulatory Visit | Attending: Family Medicine | Admitting: Family Medicine

## 2016-09-24 DIAGNOSIS — N6320 Unspecified lump in the left breast, unspecified quadrant: Secondary | ICD-10-CM | POA: Diagnosis not present

## 2016-09-24 DIAGNOSIS — R928 Other abnormal and inconclusive findings on diagnostic imaging of breast: Secondary | ICD-10-CM | POA: Insufficient documentation

## 2016-09-24 DIAGNOSIS — N6324 Unspecified lump in the left breast, lower inner quadrant: Secondary | ICD-10-CM | POA: Diagnosis not present

## 2016-09-24 DIAGNOSIS — N6323 Unspecified lump in the left breast, lower outer quadrant: Secondary | ICD-10-CM | POA: Diagnosis not present

## 2016-09-27 ENCOUNTER — Other Ambulatory Visit: Payer: Self-pay | Admitting: Family Medicine

## 2016-09-27 DIAGNOSIS — R928 Other abnormal and inconclusive findings on diagnostic imaging of breast: Secondary | ICD-10-CM

## 2016-09-27 DIAGNOSIS — N6324 Unspecified lump in the left breast, lower inner quadrant: Secondary | ICD-10-CM | POA: Diagnosis not present

## 2016-09-27 DIAGNOSIS — N6320 Unspecified lump in the left breast, unspecified quadrant: Secondary | ICD-10-CM

## 2016-09-30 NOTE — Progress Notes (Signed)
10/01/2016 9:53 AM   Meredith Wilcox [birth year] [id number]  Referring provider: [name], MD [address] [place], [place] [address]  Chief Complaint  Patient presents with  . New Patient (Initial Visit)    Urge Incontinence / prolapse of urethra referred by Dr. Ancil Wilcox    HPI: Patient is a 76 year old African American female who is referred to [name] by, Dr. Steele Wilcox, for bladder prolapse.    Patient states that she she can feel her bladder between her legs when she walks.  This has been present for several years.  She is not experiencing stress incontinence at this time.  She is having associated urinary frequency x 10-12, urgency and nocturia x 2.   She is not having associated dysuria,  intermittency, hesitancy, straining to urinate or weak urinary stream.     She does not have a history of urinary tract infections, STI's or injury to the bladder.   She denies dysuria, gross hematuria, suprapubic pain, back pain, abdominal pain or flank pain.  She has not had any recent fevers, chills, nausea or vomiting.   She does not have a history of nephrolithiasis, GU surgery or GU trauma.   She is post menopausal.  She denies constipation and/or diarrhea.   She has not had any recent imaging studies.    She is drinking three 20 ounce bottles of water daily.   She is not drinking caffeinated beverages daily.  She is not drinking  alcoholic beverages daily.    Her PVR is 24 mL.    PMH: Past Medical History:  Diagnosis Date  . Geographic tongue   . Helicobacter pylori gastrointestinal tract infection   . Hyperlipidemia   . Incontinence   . Indigestion   . Prolapse of vaginal walls   . Right sided sciatica   . Urethral prolapse     Surgical History: Past Surgical History:  Procedure Laterality Date  . dnc     41  . fatty tumor removal Right    hand    Home Medications:  Allergies as of 10/01/2016      Reactions   Tussionex Pennkinetic Er [hydrocod  Polst-cpm Polst Er] Other (See Comments)   Real dizzy headed even after a while still walking every direction but straight, patient stated she stopped taking it because of that.   Hydrocodone    Light headed and dizziness      Medication List       Accurate as of 10/01/16  9:53 AM. Always use your most recent med list.          multivitamin tablet Take 1 tablet by mouth daily.   Omega-3 1000 MG Caps Take 1 tablet by mouth daily. Reported on 06/04/2015   Vitamin D 2000 units tablet Take 1 tablet by mouth daily. Reported on 06/04/2015       Allergies:  Allergies  Allergen Reactions  . Tussionex Pennkinetic Er [Hydrocod Polst-Cpm Polst Er] Other (See Comments)    Real dizzy headed even after a while still walking every direction but straight, patient stated she stopped taking it because of that.  . Hydrocodone     Light headed and dizziness    Family History: Family History  Problem Relation Age of Onset  . Diabetes Mother   . Cardiomyopathy Sister   . Down syndrome Sister   . Leukemia Brother   . Alcohol abuse Son        youngest son  . Asthma Son  youngest son  . Rheum arthritis Sister   . Drug abuse Child 23  . Breast cancer Neg Hx   . Kidney cancer Neg Hx   . Bladder Cancer Neg Hx   . Prostate cancer Neg Hx     Social History:  reports that she has never smoked. She has never used smokeless tobacco. She reports that she does not drink alcohol or use drugs.  ROS: UROLOGY Frequent Urination?: Yes Hard to postpone urination?: Yes Burning/pain with urination?: No Get up at night to urinate?: Yes Leakage of urine?: No Urine stream starts and stops?: No Trouble starting stream?: No Do you have to strain to urinate?: No Blood in urine?: No Urinary tract infection?: No Sexually transmitted disease?: No Injury to kidneys or bladder?: No Painful intercourse?: No Weak stream?: No Currently pregnant?: No Vaginal bleeding?: No Last menstrual period?:  n  Gastrointestinal Nausea?: No Vomiting?: No Indigestion/heartburn?: No Diarrhea?: No Constipation?: No  Constitutional Fever: No Night sweats?: No Weight loss?: No Fatigue?: No  Skin Skin rash/lesions?: No Itching?: No  Eyes Blurred vision?: No Double vision?: No  Ears/Nose/Throat Sore throat?: No Sinus problems?: No  Hematologic/Lymphatic Swollen glands?: No Easy bruising?: No  Cardiovascular Leg swelling?: No Chest pain?: No  Respiratory Cough?: No Shortness of breath?: No  Endocrine Excessive thirst?: No  Musculoskeletal Back pain?: No Joint pain?: No  Neurological Headaches?: No Dizziness?: No  Psychologic Depression?: No Anxiety?: No  Physical Exam: BP (!) 143/67   Pulse 78   Ht 5\' 5"  (1.651 m)   Wt 150 lb (68 kg)   BMI 24.96 kg/m   Constitutional: Well nourished. Alert and oriented, No acute distress. HEENT: [place] AT, moist mucus membranes. Trachea midline, no masses. Cardiovascular: No clubbing, cyanosis, or edema. Respiratory: Normal respiratory effort, no increased work of breathing. GI: Abdomen is soft, non tender, non distended, no abdominal masses. Liver and spleen not palpable.  No hernias appreciated.  Stool sample for occult testing is not indicated.   GU: No CVA tenderness.  No bladder fullness or masses.  Atrophic external genitalia, normal pubic hair distribution, no lesions.  Normal urethral meatus, no lesions, no prolapse, no discharge.   Urethral caruncle vs prolapse No bladder fullness, tenderness or masses. Pale vagina mucosa, poor estrogen effect, no discharge, no lesions, good pelvic support, Grade II cystocele.  No rectocele noted.  No cervical motion tenderness.  Uterus is freely mobile and non-fixed.  No adnexal/parametria masses or tenderness noted.  Anus and perineum are without rashes or lesions.    Skin: No rashes, bruises or suspicious lesions. Lymph: No cervical or inguinal adenopathy. Neurologic: Grossly intact,  no focal deficits, moving all 4 extremities. Psychiatric: Normal mood and affect.   Pertinent Imaging: Results for Meredith, Wilcox (MRN [id number]) as of 10/01/2016 09:38  Ref. Range 10/01/2016 09:23  Scan Result Unknown 24    Assessment & Plan:    1. Urge incontinence  - offered behavioral therapies, bladder training and bladder control strategies  - pelvic floor muscle training - patient would like to defer PT at this time  - fluid management - patient would like to drink more water if her urgency improves  - offered medical therapy withbeta-3 adrenergic receptor agonist and the potential side effects  - offered refer to gynecology for a pessary fitting - she would like a referral   - would like to try the beta-3 adrenergic receptor agonist (Myrbetriq).  Given Myrbetriq 25 mg samples, #28.  I have reviewed with the patient  of the side effects of Myrbetriq, such as: elevation in BP, urinary retention and/or HA.    - RTC in 3 weeks for PVR and symptom recheck   2. Cystocele  - will refer to gynecology for pessary fitting    3. Vaginal atrophy  - Patient was given a sample of vaginal estrogen cream (Premarin/Estrace) and instructed to apply 0.5mg  (pea-sized amount)  just inside the vaginal introitus with a finger-tip every night for two weeks and then Monday, Wednesday and Friday nights.  I explained to the patient that vaginally administered estrogen, which causes only a slight increase in the blood estrogen levels, have fewer contraindications and adverse systemic effects that oral HT.  - She will follow up in three months for an exam.     Return in about 3 weeks (around 10/22/2016) for PVR and OAB questionnaire.  These notes generated with voice recognition software. I apologize for typographical errors.  Zara Council, [facility] [address] [place], [place] [address] [phone]

## 2016-10-01 ENCOUNTER — Encounter: Payer: Self-pay | Admitting: Urology

## 2016-10-01 ENCOUNTER — Ambulatory Visit (INDEPENDENT_AMBULATORY_CARE_PROVIDER_SITE_OTHER): Payer: Medicare Other | Admitting: Urology

## 2016-10-01 VITALS — BP 143/67 | HR 78 | Ht 65.0 in | Wt 150.0 lb

## 2016-10-01 DIAGNOSIS — N3941 Urge incontinence: Secondary | ICD-10-CM

## 2016-10-01 DIAGNOSIS — N952 Postmenopausal atrophic vaginitis: Secondary | ICD-10-CM

## 2016-10-01 DIAGNOSIS — N8111 Cystocele, midline: Secondary | ICD-10-CM

## 2016-10-01 LAB — BLADDER SCAN AMB NON-IMAGING: Scan Result: 24

## 2016-10-08 DIAGNOSIS — H353220 Exudative age-related macular degeneration, left eye, stage unspecified: Secondary | ICD-10-CM | POA: Diagnosis not present

## 2016-10-11 ENCOUNTER — Ambulatory Visit
Admission: RE | Admit: 2016-10-11 | Discharge: 2016-10-11 | Disposition: A | Discharge status: Home / Self Care | Payer: Medicare Other | Source: Ambulatory Visit | Attending: Family Medicine | Admitting: Family Medicine

## 2016-10-11 DIAGNOSIS — D242 Benign neoplasm of left breast: Secondary | ICD-10-CM | POA: Diagnosis not present

## 2016-10-11 DIAGNOSIS — N6320 Unspecified lump in the left breast, unspecified quadrant: Secondary | ICD-10-CM

## 2016-10-11 DIAGNOSIS — R928 Other abnormal and inconclusive findings on diagnostic imaging of breast: Secondary | ICD-10-CM | POA: Diagnosis not present

## 2016-10-11 HISTORY — PX: BREAST BIOPSY: SHX20

## 2016-10-12 LAB — SURGICAL PATHOLOGY

## 2016-10-14 ENCOUNTER — Telehealth: Payer: Self-pay

## 2016-10-14 NOTE — Telephone Encounter (Signed)
She needs to see a surgeon for further evaluation. I can talk to her if you get her on the phone

## 2016-10-14 NOTE — Telephone Encounter (Signed)
Meredith Wilcox from [facility] called to report that results were in pt chart for you to review them and that pt mother passed on July 19, 2022 so she may be difficult to reach for a few days in order to go over results with

## 2016-10-27 NOTE — Progress Notes (Signed)
10/29/2016 10:07 AM   Meredith Wilcox Oct 25, 1940 [id number]  Referring provider: [name], MD [address] [place], [place] [address]  Chief Complaint  Patient presents with  . Follow-up    3 week f/u Cystocele,Urge Incontinence, Vaginal atrophy     HPI: 75 yo AAF with urge incontinence and vaginal atrophy who presents today for a 3 week follow up.  Background history Patient is a 76 year old African American female who is referred to [name] by, Dr. [name], for bladder prolapse.  Patient states that she she can feel her bladder between her legs when she walks.  This has been present for several years.  She is not experiencing stress incontinence at this time.  She is having associated urinary frequency x 10-12, urgency and nocturia x 2.   She is not having associated dysuria,  intermittency, hesitancy, straining to urinate or weak urinary stream.   She does not have a history of urinary tract infections, STI's or injury to the bladder.   She denies dysuria, gross hematuria, suprapubic pain, back pain, abdominal pain or flank pain.  She has not had any recent fevers, chills, nausea or vomiting.   She does not have a history of nephrolithiasis, GU surgery or GU trauma.   She is post menopausal.  She denies constipation and/or diarrhea.   She has not had any recent imaging studies.  She is drinking three 20 ounce bottles of water daily.   She is not drinking caffeinated beverages daily.  She is not drinking  alcoholic beverages daily.   Her PVR is 24 mL.    At her visit three weeks ago, she was started on Myrbetriq 25 mg and vaginal estrogen cream.  The patient has been experiencing urgency x 8 or more (unchanged), frequency x 8 or more (unchanged), is restricting fluids to avoid visits to the restroom, is engaging in toilet mapping, incontinence x 0-3 (unchanged) and nocturia x 0-3 (unchanged).   She denies dysuria, gross hematuria and suprapubic.  She denies fevers, chills,  nausea and vomiting.  Her PVR is 39 mL.   She did not take the Myrbetriq samples, but she is using the vaginal estrogen cream.   She recently had a breast biopsy and it was benign.    PMH: Past Medical History:  Diagnosis Date  . Geographic tongue   . Helicobacter pylori gastrointestinal tract infection   . Hyperlipidemia   . Incontinence   . Indigestion   . Prolapse of vaginal walls   . Right sided sciatica   . Urethral prolapse     Surgical History: Past Surgical History:  Procedure Laterality Date  . BREAST BIOPSY Left 10/11/2016   left breast stereo path pending  . dnc     28  . fatty tumor removal Right    hand    Home Medications:  Allergies as of 10/29/2016      Reactions   Tussionex Pennkinetic Er [hydrocod Polst-cpm Polst Er] Other (See Comments)   Real dizzy headed even after a while still walking every direction but straight, patient stated she stopped taking it because of that.   Hydrocodone    Light headed and dizziness      Medication List       Accurate as of 10/29/16 10:07 AM. Always use your most recent med list.          multivitamin tablet Take 1 tablet by mouth daily.   Omega-3 1000 MG Caps Take 1 tablet by  mouth daily. Reported on 06/04/2015   TURMERIC PO Take by mouth daily.   Vitamin D 2000 units tablet Take 1 tablet by mouth daily. Reported on 06/04/2015       Allergies:  Allergies  Allergen Reactions  . Tussionex Pennkinetic Er [Hydrocod Polst-Cpm Polst Er] Other (See Comments)    Real dizzy headed even after a while still walking every direction but straight, patient stated she stopped taking it because of that.  . Hydrocodone     Light headed and dizziness    Family History: Family History  Problem Relation Age of Onset  . Diabetes Mother   . Hypertension Mother   . Cardiomyopathy Sister   . Down syndrome Sister   . Leukemia Brother   . Alcohol abuse Son        youngest son  . Asthma Son        youngest son  .  Rheum arthritis Sister   . Drug abuse Child 23  . Breast cancer Neg Hx   . Kidney cancer Neg Hx   . Bladder Cancer Neg Hx   . Prostate cancer Neg Hx     Social History:  reports that she has never smoked. She has never used smokeless tobacco. She reports that she does not drink alcohol or use drugs.  ROS: UROLOGY Frequent Urination?: No Hard to postpone urination?: No Burning/pain with urination?: No Get up at night to urinate?: Yes Leakage of urine?: No Urine stream starts and stops?: No Trouble starting stream?: No Do you have to strain to urinate?: No Blood in urine?: No Urinary tract infection?: No Sexually transmitted disease?: No Injury to kidneys or bladder?: No Painful intercourse?: No Weak stream?: No Currently pregnant?: No Vaginal bleeding?: No Last menstrual period?: n  Gastrointestinal Nausea?: No Vomiting?: No Indigestion/heartburn?: No Diarrhea?: No Constipation?: No  Constitutional Fever: No Night sweats?: No Weight loss?: No Fatigue?: No  Skin Skin rash/lesions?: No Itching?: No  Eyes Blurred vision?: No Double vision?: No  Ears/Nose/Throat Sore throat?: No Sinus problems?: No  Hematologic/Lymphatic Swollen glands?: Yes Easy bruising?: No  Cardiovascular Leg swelling?: No Chest pain?: No  Respiratory Cough?: No Shortness of breath?: No  Endocrine Excessive thirst?: No  Musculoskeletal Back pain?: No Joint pain?: No  Neurological Headaches?: No Dizziness?: No  Psychologic Depression?: No Anxiety?: No  Physical Exam: BP (!) 143/63   Pulse 79   Ht 5\' 5"  (1.651 m)   Wt 150 lb (68 kg)   BMI 24.96 kg/m   Constitutional: Well nourished. Alert and oriented, No acute distress. HEENT: [place] AT, moist mucus membranes. Trachea midline, no masses. Cardiovascular: No clubbing, cyanosis, or edema. Respiratory: Normal respiratory effort, no increased work of breathing. Skin: No rashes, bruises or suspicious lesions. Lymph:  No cervical or inguinal adenopathy. Neurologic: Grossly intact, no focal deficits, moving all 4 extremities. Psychiatric: Normal mood and affect.   Pertinent Imaging: Results for Meredith Wilcox, Meredith Wilcox (MRN [id number]) as of 10/29/2016 10:05  Ref. Range 10/29/2016 09:35  Scan Result Unknown 39   Assessment & Plan:    1. Urge incontinence  - offered behavioral therapies, bladder training and bladder control strategies  - pelvic floor muscle training - patient would like to defer PT at this time  - fluid management - patient would like to drink more water if her urgency improves  - offered medical therapy withbeta-3 adrenergic receptor agonist and the potential side effects - patient did not take the Myrbetriq samples that she does not want to take  pills  - offered refer to gynecology for a pessary fitting - she would like a referral   - RTC in 3 months for PVR and symptom recheck   2. Cystocele  - will refer to gynecology for pessary fitting    3. Vaginal atrophy  - continue Premarin cream three nights weekly  - She will follow up in three months for an exam.     Return in about 3 months (around 01/29/2017) for OAB questionnaire, PVR and exam.  These notes generated with voice recognition software. I apologize for typographical errors.  Zara Council, [facility] [address] [place], [place] [address] [phone]

## 2016-10-29 ENCOUNTER — Ambulatory Visit (INDEPENDENT_AMBULATORY_CARE_PROVIDER_SITE_OTHER): Payer: Medicare Other | Admitting: Urology

## 2016-10-29 ENCOUNTER — Encounter: Payer: Self-pay | Admitting: Urology

## 2016-10-29 VITALS — BP 143/63 | HR 79 | Ht 65.0 in | Wt 150.0 lb

## 2016-10-29 DIAGNOSIS — N3941 Urge incontinence: Secondary | ICD-10-CM | POA: Diagnosis not present

## 2016-10-29 DIAGNOSIS — N8111 Cystocele, midline: Secondary | ICD-10-CM | POA: Diagnosis not present

## 2016-10-29 DIAGNOSIS — N952 Postmenopausal atrophic vaginitis: Secondary | ICD-10-CM

## 2016-10-29 LAB — BLADDER SCAN AMB NON-IMAGING: Scan Result: 39

## 2016-10-29 MED ORDER — ESTROGENS, CONJUGATED 0.625 MG/GM VA CREA: 1.0000 | TOPICAL_CREAM | Freq: Every day | VAGINAL | 12 refills | Status: DC

## 2016-11-01 ENCOUNTER — Encounter: Payer: Self-pay | Admitting: *Deleted

## 2016-11-01 ENCOUNTER — Other Ambulatory Visit: Payer: Self-pay | Admitting: Family Medicine

## 2016-11-01 DIAGNOSIS — R928 Other abnormal and inconclusive findings on diagnostic imaging of breast: Secondary | ICD-10-CM

## 2016-11-03 ENCOUNTER — Encounter: Payer: Self-pay | Admitting: *Deleted

## 2016-11-09 ENCOUNTER — Ambulatory Visit (INDEPENDENT_AMBULATORY_CARE_PROVIDER_SITE_OTHER): Payer: Medicare Other | Admitting: General Surgery

## 2016-11-09 ENCOUNTER — Encounter: Payer: Self-pay | Admitting: General Surgery

## 2016-11-09 VITALS — BP 130/70 | HR 76 | Resp 13 | Ht 65.0 in | Wt 151.0 lb

## 2016-11-09 DIAGNOSIS — N6322 Unspecified lump in the left breast, upper inner quadrant: Secondary | ICD-10-CM

## 2016-11-09 NOTE — Progress Notes (Signed)
Patient ID: Meredith Wilcox, female   DOB: 03/10/1941, 75 y.o.   MRN: [id number]  Chief Complaint  Patient presents with  . Breast Problem    HPI Meredith Wilcox is a 75 y.o. female.  who presents for a breast evaluation. The most recent mammogram and left breast biopsy was done on 10-11-16.  Patient does perform regular self breast checks and gets regular mammograms done. No family hx of breast cancer. Denies feeling anything different in the breast. She does admit to a fall 2 weeks ago.  HPI  Past Medical History:  Diagnosis Date  . Geographic tongue   . Helicobacter pylori gastrointestinal tract infection   . Hyperlipidemia   . Incontinence   . Indigestion   . Prolapse of vaginal walls   . Right sided sciatica   . Urethral prolapse     Past Surgical History:  Procedure Laterality Date  . BREAST BIOPSY Left 10/11/2016   left breast stereo/ VASCULAR LESION  . dnc     1983  . fatty tumor removal Right    hand    Family History  Problem Relation Age of Onset  . Diabetes Mother   . Hypertension Mother   . Cardiomyopathy Sister   . Down syndrome Sister   . Leukemia Brother   . Alcohol abuse Son        youngest son  . Asthma Son        youngest son  . Rheum arthritis Sister   . Drug abuse Child 23  . Breast cancer Neg Hx   . Kidney cancer Neg Hx   . Bladder Cancer Neg Hx   . Prostate cancer Neg Hx     Social History Social History  Substance Use Topics  . Smoking status: Never Smoker  . Smokeless tobacco: Never Used  . Alcohol use No    Allergies  Allergen Reactions  . Tussionex Pennkinetic Er [Hydrocod Polst-Cpm Polst Er] Other (See Comments)    Real dizzy headed even after a while still walking every direction but straight, patient stated she stopped taking it because of that.  . Hydrocodone     Light headed and dizziness    Current Outpatient Prescriptions  Medication Sig Dispense Refill  . Cholecalciferol (VITAMIN D) 2000 UNITS tablet Take 1 tablet by mouth  daily. Reported on 06/04/2015    . conjugated estrogens (PREMARIN) vaginal cream Place 1 Applicatorful vaginally daily. Apply 0.5mg (pea-sized amount)  just inside the vaginal introitus with a finger-tip every night for two weeks and then Monday, Wednesday and Friday nights. 30 Wilcox 12  . Multiple Vitamins-Minerals (MULTIVITAMIN) tablet Take 1 tablet by mouth daily. 30 tablet 0  . Omega-3 1000 MG CAPS Take 1 tablet by mouth daily. Reported on 06/04/2015    . TURMERIC PO Take by mouth daily.     No current facility-administered medications for this visit.     Review of Systems Review of Systems  Constitutional: Negative.   Respiratory: Negative.   Cardiovascular: Negative.     Blood pressure 130/70, pulse 76, resp. rate 13, height 5' 5" (1.651 m), weight 151 lb (68.5 kg).  Physical Exam Physical Exam  Constitutional: She is oriented to person, place, and time. She appears well-developed and well-nourished.  Eyes: Conjunctivae are normal. No scleral icterus.  Cardiovascular: Normal rate, regular rhythm and normal heart sounds.   Pulmonary/Chest: Effort normal and breath sounds normal. Right breast exhibits no inverted nipple, no mass, no nipple discharge, no skin change and no tenderness.   Left breast exhibits no inverted nipple, no mass, no nipple discharge, no skin change and no tenderness. Breasts are symmetrical.    Abdominal: Soft. Bowel sounds are normal. There is no tenderness.  Lymphadenopathy:    She has no cervical adenopathy.    She has no axillary adenopathy.  Neurological: She is alert and oriented to person, place, and time.  Skin: Skin is warm and dry.  Psychiatric: Her behavior is normal.    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 Pt advised fully and she is agreeable. US of left breast in office shows the clip      Plan        HPI, Physical Exam, Assessment and Plan have been scribed under the direction and in the presence of S. Wilcox. Qadir Folks, MD Meredith Hatch, RN  I have completed the exam and reviewed the above documentation for accuracy and completeness.  I agree with the above.  [employer] has been used and any errors in dictation or transcription are unintentional.  Meredith Wilcox, M.D., F.A.C.S.  The patient is scheduled for surgery at [facility] on 11/26/16. She will pre admit by phone. The patient is aware of date and instructions. Documented by Meredith M Kennedy LPN   Meredith Wilcox 11/11/2016, 8:04 AM   

## 2016-11-09 NOTE — Patient Instructions (Addendum)
  The patient is aware to call back for any questions or concerns.  The patient is scheduled for surgery at [facility] on 11/26/16. She will pre admit by phone. The patient is aware of date and instructions.

## 2016-11-10 ENCOUNTER — Telehealth: Payer: Self-pay | Admitting: General Surgery

## 2016-11-10 NOTE — Telephone Encounter (Signed)
Left message for patient to call back with the phone number for the provider line to precert her procedure.Please give this information to [facility].Procedure scheduled for 11-26-16.I was unable to leave a message on her cell,no voice mail  set up.

## 2016-11-11 ENCOUNTER — Telehealth: Payer: Self-pay | Admitting: *Deleted

## 2016-11-11 NOTE — Telephone Encounter (Signed)
ERROR

## 2016-11-11 NOTE — Telephone Encounter (Signed)
Patient would like to reschedule her surgery from 11-26-16 to 12-02-16.   O.R. notified.

## 2016-11-18 ENCOUNTER — Encounter
Admission: RE | Admit: 2016-11-18 | Discharge: 2016-11-18 | Disposition: A | Discharge status: Home / Self Care | Payer: Medicare Other | Source: Ambulatory Visit | Attending: General Surgery | Admitting: General Surgery

## 2016-11-18 HISTORY — DX: Anemia, unspecified: D64.9

## 2016-11-18 HISTORY — DX: Tachycardia, unspecified: R00.0

## 2016-11-18 HISTORY — DX: Gastro-esophageal reflux disease without esophagitis: K21.9

## 2016-11-18 HISTORY — DX: Chronic kidney disease, unspecified: N18.9

## 2016-11-18 HISTORY — DX: Unspecified osteoarthritis, unspecified site: M19.90

## 2016-11-18 NOTE — Patient Instructions (Signed)
  Your procedure is scheduled on: 12-02-16 THURSDAY Report to Same Day Surgery 2nd floor medical mall [facility] Entrance-take elevator on left to 2nd floor.  Check in with surgery information desk.) To find out your arrival time please call [phone] between 1PM - 3PM on 12-01-16 [facility]  Remember: Instructions that are not followed completely may result in serious medical risk, up to and including death, or upon the discretion of your surgeon and anesthesiologist your surgery may need to be rescheduled.    _x___ 1. Do not eat food or drink liquids after midnight. No gum chewing or  hard candies.     __x__ 2. No Alcohol for 24 hours before or after surgery.   __x__3. No Smoking for 24 prior to surgery.   ____  4. Bring all medications with you on the day of surgery if instructed.    __x__ 5. Notify your doctor if there is any change in your medical condition     (cold, fever, infections).     Do not wear jewelry, make-up, hairpins, clips or nail polish.  Do not wear lotions, powders, or perfumes. You may wear deodorant.  Do not shave 48 hours prior to surgery. Men may shave face and neck.  Do not bring valuables to the hospital.    [facility] is not responsible for any belongings or valuables.               Contacts, dentures or bridgework may not be worn into surgery.  Leave your suitcase in the car. After surgery it may be brought to your room.  For patients admitted to the hospital, discharge time is determined by your treatment team.   Patients discharged the day of surgery will not be allowed to drive home.  You will need someone to drive you home and stay with you the night of your procedure.    Please read over the following fact sheets that you were given:   [facility] Preparing for Surgery and or MRSA Information   ____ Take anti-hypertensive (unless it includes a diuretic), cardiac, seizure, asthma, anti-reflux and psychiatric medicines. These include:  1.  NONE  2.  3.  4.  5.  6.  ____Fleets enema or Magnesium Citrate as directed.   _x___ Use CHG Soap or sage wipes as directed on instruction sheet   ____ Use inhalers on the day of surgery and bring to hospital day of surgery  ____ Stop Metformin and Janumet 2 days prior to surgery.    ____ Take 1/2 of usual insulin dose the night before surgery and none on the morning surgery.   ____ Follow recommendations from Cardiologist, Pulmonologist or PCP regarding stopping Aspirin, Coumadin, Pllavix ,Eliquis, Effient, or Pradaxa, and Pletal.  X____Stop Anti-inflammatories such as Advil, Aleve, Ibuprofen, Motrin, Naproxen, Naprosyn, Goodies powders or aspirin products 7 DAYS PRIOR TO SURGERY-OK to take Tylenol    _x___ Stop supplements until after surgery-STOP OMEGA 3 AND TURMERIC 7 DAYS PRIOR TO SURGERY-MAY RESUME AFTER SURGERY   ____ Bring C-Pap to the hospital.

## 2016-11-23 ENCOUNTER — Ambulatory Visit (INDEPENDENT_AMBULATORY_CARE_PROVIDER_SITE_OTHER): Payer: Medicare Other | Admitting: General Surgery

## 2016-11-23 ENCOUNTER — Inpatient Hospital Stay: Payer: Self-pay

## 2016-11-23 ENCOUNTER — Telehealth: Payer: Self-pay

## 2016-11-23 ENCOUNTER — Ambulatory Visit
Admission: RE | Admit: 2016-11-23 | Discharge: 2016-11-23 | Disposition: A | Discharge status: Home / Self Care | Payer: Medicare Other | Source: Ambulatory Visit | Attending: General Surgery | Admitting: General Surgery

## 2016-11-23 ENCOUNTER — Other Ambulatory Visit: Payer: Self-pay | Admitting: General Surgery

## 2016-11-23 VITALS — BP 122/74 | HR 68 | Resp 12 | Ht 65.0 in | Wt 150.0 lb

## 2016-11-23 DIAGNOSIS — Z01812 Encounter for preprocedural laboratory examination: Secondary | ICD-10-CM | POA: Insufficient documentation

## 2016-11-23 DIAGNOSIS — Z01818 Encounter for other preprocedural examination: Secondary | ICD-10-CM | POA: Diagnosis not present

## 2016-11-23 DIAGNOSIS — I440 Atrioventricular block, first degree: Secondary | ICD-10-CM | POA: Insufficient documentation

## 2016-11-23 DIAGNOSIS — N6322 Unspecified lump in the left breast, upper inner quadrant: Secondary | ICD-10-CM

## 2016-11-23 DIAGNOSIS — R000 Tachycardia, unspecified: Secondary | ICD-10-CM | POA: Insufficient documentation

## 2016-11-23 LAB — BASIC METABOLIC PANEL
Anion gap: 7 (ref 5–15)
BUN: 10 mg/dL (ref 6–20)
CALCIUM: 9.4 mg/dL (ref 8.9–10.3)
CO2: 29 mmol/L (ref 22–32)
Chloride: 103 mmol/L (ref 101–111)
Creatinine, Ser: 0.89 mg/dL (ref 0.44–1.00)
GFR calc Af Amer: >60 mL/min (ref >60)
GLUCOSE: 90 mg/dL (ref 65–99)
Potassium: 3.6 mmol/L (ref 3.5–5.1)
SODIUM: 139 mmol/L (ref 135–145)

## 2016-11-23 NOTE — Patient Instructions (Addendum)
Surgery planned for 12/02/16

## 2016-11-23 NOTE — Telephone Encounter (Signed)
The patient is scheduled for a wire location at [facility] on 12/02/16 and surgery afterward. The patient is aware to arrive by 8:15 am that morning.

## 2016-11-23 NOTE — Progress Notes (Signed)
Patient ID: Meredith Wilcox, female   DOB: 1941/01/17, 76 y.o.   MRN: [id number]  Chief Complaint  Patient presents with  . Pre-op Exam    Left breast ultrasound    HPI Meredith Wilcox is a 76 y.o. female is here today for a left breast pre-op exam. Patient is scheduled to have a left breast mass excision on 12/02/16, rescheduled from 11/26/16.    HPI  Past Medical History:  Diagnosis Date  . Anemia    H/O DURING PREGNANCY  . Arthritis   . Chronic kidney disease    KIDNEY PROBLEMS AROUND AGE 10  . Geographic tongue   . GERD (gastroesophageal reflux disease)    H/O NO MEDS  . Helicobacter pylori gastrointestinal tract infection   . Hyperlipidemia   . Incontinence   . Indigestion   . Prolapse of vaginal walls   . Right sided sciatica   . Tachycardia    PT STATES THAT SHE FEELS LIKE OCC HER HEART STARTS RACING  . Urethral prolapse     Past Surgical History:  Procedure Laterality Date  . BREAST BIOPSY Left 10/11/2016   left breast stereo/ VASCULAR LESION  . DILATION AND CURETTAGE OF UTERUS    . dnc     1983  . fatty tumor removal Right    hand    Family History  Problem Relation Age of Onset  . Diabetes Mother   . Hypertension Mother   . Cardiomyopathy Sister   . Down syndrome Sister   . Leukemia Brother   . Alcohol abuse Son        youngest son  . Asthma Son        youngest son  . Rheum arthritis Sister   . Drug abuse Child 23  . Breast cancer Neg Hx   . Kidney cancer Neg Hx   . Bladder Cancer Neg Hx   . Prostate cancer Neg Hx     Social History Social History  Substance Use Topics  . Smoking status: Never Smoker  . Smokeless tobacco: Never Used  . Alcohol use No    Allergies  Allergen Reactions  . Tussionex Pennkinetic Er [Hydrocod Polst-Cpm Polst Er] Other (See Comments)    Real dizzy headed even after a while still walking every direction but straight, patient stated she stopped taking it because of that.  . Hydrocodone     Light headed and dizziness     Current Outpatient Prescriptions  Medication Sig Dispense Refill  . Cholecalciferol (VITAMIN D) 2000 UNITS tablet Take 2,000 Units by mouth daily. Reported on 06/04/2015    . conjugated estrogens (PREMARIN) vaginal cream Place 1 Applicatorful vaginally daily. Apply 0.5mg  (pea-sized amount)  just inside the vaginal introitus with a finger-tip every night for two weeks and then Monday, Wednesday and Friday nights. 30 g 12  . Multiple Vitamins-Minerals ([name] PO) Take 1 tablet by mouth daily.     . Omega-3 Fatty Acids (OMEGA-3 PO) Take 1 capsule by mouth 2 (two) times daily. Once to twice daily (pt sometimes forgets 2nd dose)    . TURMERIC PO Take 1 capsule by mouth daily.      No current facility-administered medications for this visit.     Review of Systems Review of Systems  Constitutional: Negative.   Respiratory: Negative.   Cardiovascular: Negative.     Blood pressure 122/74, pulse 68, resp. rate 12, height 5\' 5"  (1.651 m), weight 150 lb (68 kg).  Physical Exam Physical Exam  Constitutional:  She is oriented to person, place, and time. She appears well-developed and well-nourished.  Eyes: Conjunctivae are normal. No scleral icterus.  Neurological: She is alert and oriented to person, place, and time.  Skin: Skin is warm and dry.    Data Reviewed Previous note and imaging  Assessment    Left breast vascular lesion due to have excision on 12/02/16  [name] of left breast overlying medial end shows the biopsy cavity has resolved. The clip is just barely visible. Feel it would be safer to have it localized with mammography and wire placement prior to surgery. Pt advised, she is agreeable.   Plan    Surgery planned for 12/02/16. Plan for wire localization pre-op.      HPI, Physical Exam, Assessment and Plan have been scribed under the direction and in the presence of Mckinley Jewel, MD.  [name], CMA  I have completed the exam and reviewed the above  documentation for accuracy and completeness.  I agree with the above.  Haematologist has been used and any errors in dictation or transcription are unintentional.  Natania Finigan G. Jamal Collin, M.D., F.A.C.S.   Junie Panning G 11/23/2016, 10:34 AM

## 2016-12-02 ENCOUNTER — Ambulatory Visit
Admission: RE | Admit: 2016-12-02 | Discharge: 2016-12-02 | Disposition: A | Discharge status: Home / Self Care | Payer: Medicare Other | Source: Ambulatory Visit | Attending: General Surgery | Admitting: General Surgery

## 2016-12-02 ENCOUNTER — Other Ambulatory Visit: Payer: Self-pay | Admitting: General Surgery

## 2016-12-02 ENCOUNTER — Ambulatory Visit: Payer: Medicare Other | Admitting: Anesthesiology

## 2016-12-02 ENCOUNTER — Encounter
Admission: RE | Disposition: A | Discharge status: Home / Self Care | Payer: Self-pay | Source: Ambulatory Visit | Attending: General Surgery

## 2016-12-02 DIAGNOSIS — E785 Hyperlipidemia, unspecified: Secondary | ICD-10-CM | POA: Insufficient documentation

## 2016-12-02 DIAGNOSIS — N6322 Unspecified lump in the left breast, upper inner quadrant: Secondary | ICD-10-CM | POA: Diagnosis not present

## 2016-12-02 DIAGNOSIS — Z888 Allergy status to other drugs, medicaments and biological substances status: Secondary | ICD-10-CM | POA: Diagnosis not present

## 2016-12-02 DIAGNOSIS — R928 Other abnormal and inconclusive findings on diagnostic imaging of breast: Secondary | ICD-10-CM | POA: Diagnosis not present

## 2016-12-02 DIAGNOSIS — N6320 Unspecified lump in the left breast, unspecified quadrant: Secondary | ICD-10-CM | POA: Insufficient documentation

## 2016-12-02 DIAGNOSIS — Z79890 Hormone replacement therapy (postmenopausal): Secondary | ICD-10-CM | POA: Insufficient documentation

## 2016-12-02 DIAGNOSIS — D1779 Benign lipomatous neoplasm of other sites: Secondary | ICD-10-CM | POA: Diagnosis not present

## 2016-12-02 DIAGNOSIS — M1990 Unspecified osteoarthritis, unspecified site: Secondary | ICD-10-CM | POA: Diagnosis not present

## 2016-12-02 HISTORY — PX: BREAST EXCISIONAL BIOPSY: SUR124

## 2016-12-02 HISTORY — PX: BREAST LUMPECTOMY: SHX2

## 2016-12-02 HISTORY — PX: BREAST BIOPSY: SHX20

## 2016-12-02 SURGERY — BREAST LUMPECTOMY
Anesthesia: General | Laterality: Left | Wound class: Clean

## 2016-12-02 MED ORDER — FAMOTIDINE 20 MG PO TABS
20.0000 mg | ORAL_TABLET | Freq: Once | ORAL | Status: AC
  Administered 2016-12-02: 20 mg via ORAL

## 2016-12-02 MED ORDER — FENTANYL CITRATE (PF) 100 MCG/2ML IJ SOLN
INTRAMUSCULAR | Status: AC
  Filled 2016-12-02: qty 2

## 2016-12-02 MED ORDER — FAMOTIDINE 20 MG PO TABS
ORAL_TABLET | ORAL | Status: AC
  Filled 2016-12-02: qty 1

## 2016-12-02 MED ORDER — CHLORHEXIDINE GLUCONATE CLOTH 2 % EX PADS: 6.0000 | MEDICATED_PAD | Freq: Once | CUTANEOUS | Status: DC

## 2016-12-02 MED ORDER — FENTANYL CITRATE (PF) 100 MCG/2ML IJ SOLN
INTRAMUSCULAR | Status: DC | PRN
  Administered 2016-12-02: 50 ug via INTRAVENOUS

## 2016-12-02 MED ORDER — LIDOCAINE HCL (CARDIAC) 20 MG/ML IV SOLN
INTRAVENOUS | Status: DC | PRN
  Administered 2016-12-02: 80 mg via INTRAVENOUS

## 2016-12-02 MED ORDER — BUPIVACAINE HCL (PF) 0.5 % IJ SOLN
INTRAMUSCULAR | Status: DC | PRN
  Administered 2016-12-02: 20 mL

## 2016-12-02 MED ORDER — DEXAMETHASONE SODIUM PHOSPHATE 10 MG/ML IJ SOLN
INTRAMUSCULAR | Status: AC
  Filled 2016-12-02: qty 1

## 2016-12-02 MED ORDER — PROPOFOL 10 MG/ML IV BOLUS
INTRAVENOUS | Status: DC | PRN
  Administered 2016-12-02: 60 mg via INTRAVENOUS
  Administered 2016-12-02: 100 mg via INTRAVENOUS

## 2016-12-02 MED ORDER — FENTANYL CITRATE (PF) 100 MCG/2ML IJ SOLN: 25.0000 ug | INTRAMUSCULAR | Status: DC | PRN

## 2016-12-02 MED ORDER — BUPIVACAINE HCL (PF) 0.5 % IJ SOLN
INTRAMUSCULAR | Status: AC
  Filled 2016-12-02: qty 30

## 2016-12-02 MED ORDER — DEXAMETHASONE SODIUM PHOSPHATE 10 MG/ML IJ SOLN
INTRAMUSCULAR | Status: DC | PRN
  Administered 2016-12-02: 10 mg via INTRAVENOUS

## 2016-12-02 MED ORDER — ONDANSETRON HCL 4 MG/2ML IJ SOLN
INTRAMUSCULAR | Status: DC | PRN
  Administered 2016-12-02: 4 mg via INTRAVENOUS

## 2016-12-02 MED ORDER — PROPOFOL 10 MG/ML IV BOLUS
INTRAVENOUS | Status: AC
  Filled 2016-12-02: qty 20

## 2016-12-02 MED ORDER — ONDANSETRON HCL 4 MG/2ML IJ SOLN
INTRAMUSCULAR | Status: AC
  Filled 2016-12-02: qty 2

## 2016-12-02 MED ORDER — LIDOCAINE HCL (PF) 2 % IJ SOLN
INTRAMUSCULAR | Status: AC
  Filled 2016-12-02: qty 2

## 2016-12-02 MED ORDER — LACTATED RINGERS IV SOLN
INTRAVENOUS | Status: DC
  Administered 2016-12-02 (×2): via INTRAVENOUS

## 2016-12-02 MED ORDER — MIDAZOLAM HCL 2 MG/2ML IJ SOLN
INTRAMUSCULAR | Status: AC
  Filled 2016-12-02: qty 2

## 2016-12-02 MED ORDER — TRAMADOL HCL 50 MG PO TABS: 50.0000 mg | ORAL_TABLET | Freq: Four times a day (QID) | ORAL | 0 refills | Status: DC | PRN

## 2016-12-02 MED ORDER — MIDAZOLAM HCL 2 MG/2ML IJ SOLN
INTRAMUSCULAR | Status: DC | PRN
  Administered 2016-12-02: 2 mg via INTRAVENOUS

## 2016-12-02 SURGICAL SUPPLY — 46 items
BLADE SURG 15 STRL SS SAFETY (BLADE) ×3 IMPLANT
BULB RESERV EVAC DRAIN JP 100C (MISCELLANEOUS) IMPLANT
CANISTER SUCT 1200ML W/VALVE (MISCELLANEOUS) ×3 IMPLANT
CHLORAPREP W/TINT 26ML (MISCELLANEOUS) ×3 IMPLANT
CNTNR SPEC 2.5X3XGRAD LEK (MISCELLANEOUS)
CONT SPEC 4OZ STER OR WHT (MISCELLANEOUS)
CONTAINER SPEC 2.5X3XGRAD LEK (MISCELLANEOUS) IMPLANT
COVER PROBE FLX POLY STRL (MISCELLANEOUS) ×3 IMPLANT
DERMABOND ADVANCED (GAUZE/BANDAGES/DRESSINGS) ×2
DERMABOND ADVANCED .7 DNX12 (GAUZE/BANDAGES/DRESSINGS) ×1 IMPLANT
DEVICE DUBIN SPECIMEN MAMMOGRA (MISCELLANEOUS) ×3 IMPLANT
DEVICE LOCALIZATION ULTRAWIRE (WIRE) IMPLANT
DEVICE ULTRAWIRE LOCAL 19X9 (WIRE) IMPLANT
DRAIN CHANNEL JP 15F RND 16 (MISCELLANEOUS) IMPLANT
DRAPE LAPAROTOMY 100X77 ABD (DRAPES) ×3 IMPLANT
DRAPE LAPAROTOMY TRNSV 106X77 (MISCELLANEOUS) ×3 IMPLANT
ELECT CAUTERY BLADE TIP 2.5 (TIP) ×3
ELECT REM PT RETURN 9FT ADLT (ELECTROSURGICAL) ×3
ELECTRODE CAUTERY BLDE TIP 2.5 (TIP) ×1 IMPLANT
ELECTRODE REM PT RTRN 9FT ADLT (ELECTROSURGICAL) ×1 IMPLANT
GLOVE BIO SURGEON STRL SZ7 (GLOVE) ×9 IMPLANT
GOWN STRL REUS W/ TWL LRG LVL3 (GOWN DISPOSABLE) ×3 IMPLANT
GOWN STRL REUS W/TWL LRG LVL3 (GOWN DISPOSABLE) ×6
KIT RM TURNOVER STRD PROC AR (KITS) ×3 IMPLANT
LABEL OR SOLS (LABEL) ×3 IMPLANT
MARGIN MAP 10MM (MISCELLANEOUS) ×3 IMPLANT
NDL HPO THNWL 1X22GA REG BVL (NEEDLE) ×1 IMPLANT
NEEDLE HYPO 25X1 1.5 SAFETY (NEEDLE) ×3 IMPLANT
NEEDLE SAFETY 22GX1 (NEEDLE) ×2
PACK BASIN MINOR ARMC (MISCELLANEOUS) ×3 IMPLANT
SHEARS HARMONIC 9CM CVD (BLADE) IMPLANT
SUT ETH BLK MONO 3 0 FS 1 12/B (SUTURE) ×3 IMPLANT
SUT ETHILON 3-0 FS-10 30 BLK (SUTURE) ×3
SUT MNCRL AB 3-0 PS2 27 (SUTURE) ×3 IMPLANT
SUT VIC AB 2-0 BRD 54 (SUTURE) ×3 IMPLANT
SUT VIC AB 2-0 CT2 27 (SUTURE) ×3 IMPLANT
SUT VIC AB 2-0 SH 27 (SUTURE) ×2
SUT VIC AB 2-0 SH 27XBRD (SUTURE) ×1 IMPLANT
SUT VIC AB 3-0 54X BRD REEL (SUTURE) ×1 IMPLANT
SUT VIC AB 3-0 BRD 54 (SUTURE) ×2
SUT VIC AB 4-0 FS2 27 (SUTURE) ×3 IMPLANT
SUTURE EHLN 3-0 FS-10 30 BLK (SUTURE) ×1 IMPLANT
SYR CONTROL 10ML (SYRINGE) ×3 IMPLANT
ULTRAWIRE LOCAL DEVICE 19X9 (WIRE)
ULTRAWIRE LOCALIZATION DEVICE (WIRE)
WATER STERILE IRR 1000ML POUR (IV SOLUTION) ×3 IMPLANT

## 2016-12-02 NOTE — H&P (View-Only) (Signed)
Patient ID: Meredith Wilcox, female   DOB: 1940-08-10, 76 y.o.   MRN: [id number]  Chief Complaint  Patient presents with  . Breast Problem    HPI Meredith Wilcox is a 76 y.o. female.  who presents for a breast evaluation. The most recent mammogram and left breast biopsy was done on 10-11-16.  Patient does perform regular self breast checks and gets regular mammograms done. No family hx of breast cancer. Denies feeling anything different in the breast. She does admit to a fall 2 weeks ago.  HPI  Past Medical History:  Diagnosis Date  . Geographic tongue   . Helicobacter pylori gastrointestinal tract infection   . Hyperlipidemia   . Incontinence   . Indigestion   . Prolapse of vaginal walls   . Right sided sciatica   . Urethral prolapse     Past Surgical History:  Procedure Laterality Date  . BREAST BIOPSY Left 10/11/2016   left breast stereo/ VASCULAR LESION  . dnc     75  . fatty tumor removal Right    hand    Family History  Problem Relation Age of Onset  . Diabetes Mother   . Hypertension Mother   . Cardiomyopathy Sister   . Down syndrome Sister   . Leukemia Brother   . Alcohol abuse Son        youngest son  . Asthma Son        youngest son  . Rheum arthritis Sister   . Drug abuse Child 23  . Breast cancer Neg Hx   . Kidney cancer Neg Hx   . Bladder Cancer Neg Hx   . Prostate cancer Neg Hx     Social History Social History  Substance Use Topics  . Smoking status: Never Smoker  . Smokeless tobacco: Never Used  . Alcohol use No    Allergies  Allergen Reactions  . Tussionex Pennkinetic Er [Hydrocod Polst-Cpm Polst Er] Other (See Comments)    Real dizzy headed even after a while still walking every direction but straight, patient stated she stopped taking it because of that.  . Hydrocodone     Light headed and dizziness    Current Outpatient Prescriptions  Medication Sig Dispense Refill  . Cholecalciferol (VITAMIN D) 2000 UNITS tablet Take 1 tablet by mouth  daily. Reported on 06/04/2015    . conjugated estrogens (PREMARIN) vaginal cream Place 1 Applicatorful vaginally daily. Apply 0.5mg  (pea-sized amount)  just inside the vaginal introitus with a finger-tip every night for two weeks and then Monday, Wednesday and Friday nights. 30 g 12  . Multiple Vitamins-Minerals (MULTIVITAMIN) tablet Take 1 tablet by mouth daily. 30 tablet 0  . Omega-3 1000 MG CAPS Take 1 tablet by mouth daily. Reported on 06/04/2015    . TURMERIC PO Take by mouth daily.     No current facility-administered medications for this visit.     Review of Systems Review of Systems  Constitutional: Negative.   Respiratory: Negative.   Cardiovascular: Negative.     Blood pressure 130/70, pulse 76, resp. rate 13, height 5\' 5"  (1.651 m), weight 151 lb (68.5 kg).  Physical Exam Physical Exam  Constitutional: She is oriented to person, place, and time. She appears well-developed and well-nourished.  Eyes: Conjunctivae are normal. No scleral icterus.  Cardiovascular: Normal rate, regular rhythm and normal heart sounds.   Pulmonary/Chest: Effort normal and breath sounds normal. Right breast exhibits no inverted nipple, no mass, no nipple discharge, no skin change and no tenderness.  Left breast exhibits no inverted nipple, no mass, no nipple discharge, no skin change and no tenderness. Breasts are symmetrical.    Abdominal: Soft. Bowel sounds are normal. There is no tenderness.  Lymphadenopathy:    She has no cervical adenopathy.    She has no axillary adenopathy.  Neurological: She is alert and oriented to person, place, and time.  Skin: Skin is warm and dry.  Psychiatric: Her behavior is normal.    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 Pt advised fully and she is agreeable. [name] of left breast in office shows the clip      Plan        HPI, Physical Exam, Assessment and Plan have been scribed under the direction and in the presence of Mckinley Jewel, MD Karie Fetch, RN  I have completed the exam and reviewed the above documentation for accuracy and completeness.  I agree with the above.  Haematologist has been used and any errors in dictation or transcription are unintentional.  Rashawd Laskaris G. Jamal Collin, M.D., F.A.C.S.  The patient is scheduled for surgery at [facility] on 11/26/16. She will pre admit by phone. The patient is aware of date and instructions. Documented by Lesly Rubenstein LPN   Esperanza Madrazo G 11/11/2016, 8:04 AM

## 2016-12-02 NOTE — Anesthesia Procedure Notes (Signed)
Procedure Name: LMA Insertion Date/Time: 12/02/2016 10:28 AM Performed by: Silvana Newness Pre-anesthesia Checklist: Patient identified, Emergency Drugs available, Suction available, Patient being monitored and Timeout performed Patient Re-evaluated:Patient Re-evaluated prior to induction Oxygen Delivery Method: Circle system utilized Preoxygenation: Pre-oxygenation with 100% oxygen Induction Type: IV induction Ventilation: Mask ventilation without difficulty LMA: LMA inserted LMA Size: 4.0 Number of attempts: 1 Placement Confirmation: positive ETCO2 and breath sounds checked- equal and bilateral Tube secured with: Tape Dental Injury: Teeth and Oropharynx as per pre-operative assessment

## 2016-12-02 NOTE — Discharge Instructions (Signed)
AMBULATORY SURGERY  °DISCHARGE INSTRUCTIONS ° ° °1) The drugs that you were given will stay in your system until tomorrow so for the next 24 hours you should not: ° °A) Drive an automobile °B) Make any legal decisions °C) Drink any alcoholic beverage ° ° °2) You may resume regular meals tomorrow.  Today it is better to start with liquids and gradually work up to solid foods. ° °You may eat anything you prefer, but it is better to start with liquids, then soup and crackers, and gradually work up to solid foods. ° ° °3) Please notify your doctor immediately if you have any unusual bleeding, trouble breathing, redness and pain at the surgery site, drainage, fever, or pain not relieved by medication. ° ° ° °4) Additional Instructions: ° ° ° ° ° ° ° °Please contact your physician with any problems or Same Day Surgery at [phone], Monday through Friday 6 am to 4 pm, or [place] [facility] [place] Main number at [phone]. °

## 2016-12-02 NOTE — Anesthesia Preprocedure Evaluation (Signed)
Anesthesia Evaluation  Patient identified by MRN, date of birth, ID band Patient awake    Reviewed: Allergy & Precautions, H&P , NPO status , Patient's Chart, lab work & pertinent test results  History of Anesthesia Complications Negative for: history of anesthetic complications  Airway Mallampati: III  TM Distance: <3 FB Neck ROM: limited    Dental  (+) Poor Dentition, Chipped, Missing, Upper Dentures   Pulmonary neg pulmonary ROS, neg shortness of breath,           Cardiovascular Exercise Tolerance: Good (-) angina(-) Past MI and (-) DOE + dysrhythmias Supra Ventricular Tachycardia      Neuro/Psych  Neuromuscular disease negative psych ROS   GI/Hepatic Neg liver ROS, GERD  Medicated and Controlled,  Endo/Other  negative endocrine ROS  Renal/GU Renal disease     Musculoskeletal  (+) Arthritis ,   Abdominal   Peds  Hematology negative hematology ROS (+)   Anesthesia Other Findings Past Medical History: No date: Anemia     Comment:  H/O DURING PREGNANCY No date: Arthritis No date: Chronic kidney disease     Comment:  KIDNEY PROBLEMS AROUND AGE 76 No date: Geographic tongue No date: GERD (gastroesophageal reflux disease)     Comment:  H/O NO MEDS No date: Helicobacter pylori gastrointestinal tract infection No date: Hyperlipidemia No date: Incontinence No date: Indigestion No date: Prolapse of vaginal walls No date: Right sided sciatica No date: Tachycardia     Comment:  PT STATES THAT SHE FEELS LIKE OCC HER HEART STARTS               RACING No date: Urethral prolapse  Past Surgical History: 10/11/2016: BREAST BIOPSY; Left     Comment:  left breast stereo/ VASCULAR LESION 12/02/2016: BREAST EXCISIONAL BIOPSY; Left     Comment:  left lumpectomy No date: DILATION AND CURETTAGE OF UTERUS No date: dnc     Comment:  1983 No date: fatty tumor removal; Right     Comment:  hand  BMI    Body Mass Index:   24.96 kg/m      Reproductive/Obstetrics negative OB ROS                             Anesthesia Physical Anesthesia Plan  ASA: III  Anesthesia Plan: General LMA   Post-op Pain Management:    Induction: Intravenous  PONV Risk Score and Plan:   Airway Management Planned: LMA  Additional Equipment:   Intra-op Plan:   Post-operative Plan: Extubation in OR  Informed Consent: I have reviewed the patients History and Physical, chart, labs and discussed the procedure including the risks, benefits and alternatives for the proposed anesthesia with the patient or authorized representative who has indicated his/her understanding and acceptance.   Dental Advisory Given  Plan Discussed with: Anesthesiologist, CRNA and Surgeon  Anesthesia Plan Comments: (Patient consented for risks of anesthesia including but not limited to:  - adverse reactions to medications - damage to teeth, lips or other oral mucosa - sore throat or hoarseness - Damage to heart, brain, lungs or loss of life  Patient voiced understanding.)        Anesthesia Quick Evaluation

## 2016-12-02 NOTE — Interval H&P Note (Signed)
History and Physical Interval Note:  12/02/2016 10:11 AM  Meredith Wilcox  has presented today for surgery, with the diagnosis of LEFT BREAST MASS  The various methods of treatment have been discussed with the patient and family. After consideration of risks, benefits and other options for treatment, the patient has consented to  Procedure(s): EXCISION BREAST MASS (Left) NEEDLE LOCALIZATION (Left) as a surgical intervention .  The patient's history has been reviewed, patient examined, no change in status, stable for surgery.  I have reviewed the patient's chart and labs.  Questions were answered to the patient's satisfaction.     SANKAR,SEEPLAPUTHUR G

## 2016-12-02 NOTE — Anesthesia Postprocedure Evaluation (Signed)
Anesthesia Post Note  Patient: Meredith Wilcox  Procedure(s) Performed: Procedure(s) (LRB): EXCISION BREAST MASS (Left) NEEDLE LOCALIZATION (Left)  Patient location during evaluation: PACU Anesthesia Type: General Level of consciousness: awake and alert Pain management: pain level controlled Vital Signs Assessment: post-procedure vital signs reviewed and stable Respiratory status: spontaneous breathing, nonlabored ventilation, respiratory function stable and patient connected to nasal cannula oxygen Cardiovascular status: blood pressure returned to baseline and stable Postop Assessment: no signs of nausea or vomiting Anesthetic complications: no     Last Vitals:  Vitals:   12/02/16 1241 12/02/16 1307  BP:  (!) 151/68  Pulse:  60  Resp:  16  Temp: 36.7 C   SpO2:  100%    Last Pain:  Vitals:   12/02/16 1241  TempSrc: Temporal  PainSc:                  [name]

## 2016-12-02 NOTE — Op Note (Signed)
Preop diagnosis: Left breast vascular lesion of uncertain nature  Post op diagnosis: Same  Operation: Excision left breast mass with wire localization and ultrasound guidance  Surgeon: Mimbres or  Assistant:     Anesthesia: Gen. with LMA  Complications: None  EBL: Minimal  Drains: None  Description: Patient underwent wire localization or for a lesion in the medial end of the left breast at the 9:00 location. She was then brought the operating room and put to sleep with an LMA left breast was prepped and draped as sterile field. Timeout was performed.  Review from the mammogram pain after wire placement the showed the clip to be in close proximity to the distal end of the wire. The lesion was not too far from the skin nature with paucity of tissue at the medial edge of the left breast dislocation. A transverse incision incision was then made from the wire entrance site. In the subcutaneous tissue no abnormality was noted the wire was actually into the tissue approximately a centimeter and a half in length and core of tissue surrounding this was excised out. Grossly there was no visible findings at this site nor was there any palpable lump. The excised tissue went down to the level of the muscle. Specimen mammogram was same but did not show the presence of the clip within. Ultrasound probe was brought up in the location of the clip within the identified and location inferior to the excision site. Subcutaneous tissue inferiorly was inspected and the 1-1/2 cm sized the core tissue was then excised out from this region extending from the skin down to the muscle.  Mini C-arm was used the shoulder the clip was in place and this was subsequently verified of the traditional mammogram. The additional tissue was also sent to pathology after mammogram. Grossly again there was no palpable finding in this tissue some minimal bruising was noted consistent likely with previous biopsy site changes. After  ensuring hemostasis the deeper tissue closed with 2-0 Vicryl and the skin closed with interrupted the subcuticular stitches of 3-0 Monocryl. Dermabond was applied and patient subsequently extubated and returned recovery room stable condition.

## 2016-12-02 NOTE — Anesthesia Post-op Follow-up Note (Signed)
Anesthesia QCDR form completed.        

## 2016-12-02 NOTE — Transfer of Care (Signed)
Immediate Anesthesia Transfer of Care Note  Patient: Meredith Wilcox  Procedure(s) Performed: Procedure(s): EXCISION BREAST MASS (Left) NEEDLE LOCALIZATION (Left)  Patient Location: PACU  Anesthesia Type:General  Level of Consciousness: drowsy and patient cooperative  Airway & Oxygen Therapy: Patient Spontanous Breathing and Patient connected to face mask oxygen  Post-op Assessment: Report given to RN, Post -op Vital signs reviewed and stable and Patient moving all extremities X 4  Post vital signs: Reviewed and stable  Last Vitals:  Vitals:   12/02/16 0934  BP: (!) 162/74  Pulse: 69  Resp: 16  Temp: 36.8 C  SpO2: 100%    Last Pain:  Vitals:   12/02/16 0934  TempSrc: Oral         Complications: No apparent anesthesia complications

## 2016-12-02 NOTE — Interval H&P Note (Signed)
History and Physical Interval Note:  12/02/2016 10:11 AM  Meredith Wilcox  has presented today for surgery, with the diagnosis of LEFT BREAST MASS  The various methods of treatment have been discussed with the patient and family. After consideration of risks, benefits and other options for treatment, the patient has consented to  Procedure(s): EXCISION BREAST MASS (Left) NEEDLE LOCALIZATION (Left) as a surgical intervention .  The patient's history has been reviewed, patient examined, no change in status, stable for surgery.  I have reviewed the patient's chart and labs.  Questions were answered to the patient's satisfaction.     Doll Frazee G

## 2016-12-03 LAB — SURGICAL PATHOLOGY

## 2016-12-09 ENCOUNTER — Ambulatory Visit (INDEPENDENT_AMBULATORY_CARE_PROVIDER_SITE_OTHER): Payer: Medicare Other | Admitting: General Surgery

## 2016-12-09 ENCOUNTER — Encounter: Payer: Self-pay | Admitting: General Surgery

## 2016-12-09 ENCOUNTER — Encounter: Payer: Medicare Other | Admitting: Obstetrics and Gynecology

## 2016-12-09 VITALS — BP 136/62 | HR 80 | Resp 12 | Ht 64.0 in | Wt 150.0 lb

## 2016-12-09 DIAGNOSIS — N6322 Unspecified lump in the left breast, upper inner quadrant: Secondary | ICD-10-CM

## 2016-12-09 NOTE — Patient Instructions (Signed)
Return in two months

## 2016-12-09 NOTE — Progress Notes (Signed)
Patient ID: Meredith Wilcox, female   DOB: 1941/03/13, 76 y.o.   MRN: [id number]  Chief Complaint  Patient presents with  . Routine Post Op    HPI Meredith Wilcox is a 76 y.o. female here today for her post op left breast excision done on 12/02/2016. Patient states she is doing well.  HPI  Past Medical History:  Diagnosis Date  . Anemia    H/O DURING PREGNANCY  . Arthritis   . Chronic kidney disease    KIDNEY PROBLEMS AROUND AGE 66  . Geographic tongue   . GERD (gastroesophageal reflux disease)    H/O NO MEDS  . Helicobacter pylori gastrointestinal tract infection   . Hyperlipidemia   . Incontinence   . Indigestion   . Prolapse of vaginal walls   . Right sided sciatica   . Tachycardia    PT STATES THAT SHE FEELS LIKE OCC HER HEART STARTS RACING  . Urethral prolapse     Past Surgical History:  Procedure Laterality Date  . BREAST BIOPSY Left 10/11/2016   left breast stereo/ VASCULAR LESION  . BREAST BIOPSY Left 12/02/2016   Procedure: NEEDLE LOCALIZATION;  Surgeon: [name], MD;  Location: [facility] ORS;  Service: General;  Laterality: Left;  . BREAST EXCISIONAL BIOPSY Left 12/02/2016   left lumpectomy  . BREAST LUMPECTOMY Left 12/02/2016   Procedure: EXCISION BREAST MASS;  Surgeon: [name], MD;  Location: [facility] ORS;  Service: General;  Laterality: Left;  . DILATION AND CURETTAGE OF UTERUS    . dnc     1983  . fatty tumor removal Right    hand    Family History  Problem Relation Age of Onset  . Diabetes Mother   . Hypertension Mother   . Cardiomyopathy Sister   . Down syndrome Sister   . Leukemia Brother   . Alcohol abuse Son        youngest son  . Asthma Son        youngest son  . Rheum arthritis Sister   . Drug abuse Child 23  . Breast cancer Neg Hx   . Kidney cancer Neg Hx   . Bladder Cancer Neg Hx   . Prostate cancer Neg Hx     Social History Social History  Substance Use Topics  . Smoking status: Never Smoker  . Smokeless tobacco:  Never Used  . Alcohol use No    Allergies  Allergen Reactions  . Tussionex Pennkinetic Er [Hydrocod Polst-Cpm Polst Er] Other (See Comments)    Real dizzy headed even after a while still walking every direction but straight, patient stated she stopped taking it because of that.  . Hydrocodone     Light headed and dizziness    Current Outpatient Prescriptions  Medication Sig Dispense Refill  . Cholecalciferol (VITAMIN D) 2000 UNITS tablet Take 2,000 Units by mouth daily. Reported on 06/04/2015    . conjugated estrogens (PREMARIN) vaginal cream Place 1 Applicatorful vaginally daily. Apply 0.5mg  (pea-sized amount)  just inside the vaginal introitus with a finger-tip every night for two weeks and then Monday, Wednesday and Friday nights. 30 g 12  . Multiple Vitamins-Minerals ([name] PO) Take 1 tablet by mouth daily.     . Omega-3 Fatty Acids (OMEGA-3 PO) Take 1 capsule by mouth 2 (two) times daily. Once to twice daily (pt sometimes forgets 2nd dose)    . TURMERIC PO Take 1 capsule by mouth daily.      No current facility-administered medications  for this visit.     Review of Systems Review of Systems  Constitutional: Negative.   Respiratory: Negative.   Cardiovascular: Negative.     Blood pressure 136/62, pulse 80, resp. rate 12, height 5\' 4"  (1.626 m), weight 150 lb (68 kg).  Physical Exam Physical Exam  Constitutional: She is oriented to person, place, and time. She appears well-developed and well-nourished.  Pulmonary/Chest:    Neurological: She is alert and oriented to person, place, and time.  Skin: Skin is warm and dry.    Data Reviewed Prior notes and path reviewed   Assessment   Angiolipoma left breast. Patient again advised on the benign nature of this Stable exam, left breast incision is clean and healing well.     Plan    Patient to return in two months. The patient is aware to call back for any questions or concerns.     HPI, Physical Exam,  Assessment and Plan have been scribed under the direction and in the presence of Mckinley Jewel, MD  Gaspar Cola, CMA   I have completed the exam and reviewed the above documentation for accuracy and completeness.  I agree with the above.  Haematologist has been used and any errors in dictation or transcription are unintentional.  Seeplaputhur G. Jamal Collin, M.D., F.A.C.S.   Junie Panning G 12/09/2016, 11:32 AM

## 2016-12-16 ENCOUNTER — Encounter: Payer: Medicare Other | Admitting: Obstetrics and Gynecology

## 2016-12-21 ENCOUNTER — Ambulatory Visit (INDEPENDENT_AMBULATORY_CARE_PROVIDER_SITE_OTHER): Payer: Medicare Other | Admitting: Obstetrics and Gynecology

## 2016-12-21 ENCOUNTER — Encounter: Payer: Self-pay | Admitting: Obstetrics and Gynecology

## 2016-12-21 VITALS — BP 137/69 | HR 76 | Ht 64.0 in | Wt 148.9 lb

## 2016-12-21 DIAGNOSIS — N362 Urethral caruncle: Secondary | ICD-10-CM | POA: Diagnosis not present

## 2016-12-21 DIAGNOSIS — N952 Postmenopausal atrophic vaginitis: Secondary | ICD-10-CM | POA: Diagnosis not present

## 2016-12-21 DIAGNOSIS — N8111 Cystocele, midline: Secondary | ICD-10-CM | POA: Diagnosis not present

## 2016-12-21 NOTE — Patient Instructions (Addendum)
1. A #3 ring with support diaphragm pessary is fitted today. 2. Patient will return in approximately 1 week for insertion of pessary when it arrives. Patient will be contacted by phone. 3. Continue using Premarin cream intravaginally once or twice a week

## 2016-12-21 NOTE — Progress Notes (Signed)
GYN ENCOUNTER NOTE  Subjective:       Meredith Wilcox is a 76 y.o. [id number] female is here for gynecologic evaluation of the following issues:  1. Pessary fitting.    History of SVD 4 to: Large baby 8 lbs. 12 oz. No stress urinary incontinence symptoms. Urge incontinence symptoms present. Patient will leak if she ignores the need to initially void when overwhelming urgency develops. No history of chronic UTIs or pyelonephritis. Patient was offered a trial of Merbetriq but she did not start the medicine. The patient is using minimal estrogen cream intravaginally since being seen by urology  Previous workup by urology has been noted and post void residual is less than 37 cc on 2 separate occasions. Patient is not on any HRT therapy.   Gynecologic History No LMP recorded. Patient is postmenopausal. Contraception: post menopausal status Last Pap: Normal 2011 Last mammogram: 8/23 /2018 breast biopsy-benign  Obstetric History OB History  Gravida Para Term Preterm AB Living  5 5 4 1   4   SAB TAB Ectopic Multiple Live Births          4    # Outcome Date GA Lbr Len/2nd Weight Sex Delivery Anes PTL Lv  5 Term 1983   7 lb 1.6 oz (3.221 kg) M Vag-Spont   LIV  4 Term 1972   8 lb 1.9 oz (3.683 kg) M Vag-Spont   LIV  3 Term 1970   8 lb 1.6 oz (3.674 kg) F Vag-Spont   LIV  2 Preterm 1968        FD  1 Term 1965   7 lb 6.4 oz (3.357 kg) M Vag-Spont   LIV    Obstetric Comments  1st Menstrual Cycle:  13   1st Pregnancy:  21    Past Medical History:  Diagnosis Date  . Anemia    H/O DURING PREGNANCY  . Arthritis   . Chronic kidney disease    KIDNEY PROBLEMS AROUND AGE 76  . Geographic tongue   . GERD (gastroesophageal reflux disease)    H/O NO MEDS  . Helicobacter pylori gastrointestinal tract infection   . Hyperlipidemia   . Incontinence   . Indigestion   . Prolapse of vaginal walls   . Right sided sciatica   . Tachycardia    PT STATES THAT SHE FEELS LIKE OCC HER HEART STARTS RACING  .  Urethral prolapse     Past Surgical History:  Procedure Laterality Date  . BREAST BIOPSY Left 10/11/2016   left breast stereo/ VASCULAR LESION  . BREAST BIOPSY Left 12/02/2016   Procedure: NEEDLE LOCALIZATION;  Surgeon: [name], MD;  Location: [facility];  Service: General;  Laterality: Left;  . BREAST EXCISIONAL BIOPSY Left 12/02/2016   left lumpectomy  . BREAST LUMPECTOMY Left 12/02/2016   Procedure: EXCISION BREAST MASS;  Surgeon: [name], MD;  Location: [facility];  Service: General;  Laterality: Left;  . DILATION AND CURETTAGE OF UTERUS    . dnc     1983  . fatty tumor removal Right    hand    Current Outpatient Prescriptions on File Prior to Visit  Medication Sig Dispense Refill  . Cholecalciferol (VITAMIN D) 2000 UNITS tablet Take 2,000 Units by mouth daily. Reported on 06/04/2015    . conjugated estrogens (PREMARIN) vaginal cream Place 1 Applicatorful vaginally daily. Apply 0.5mg  (pea-sized amount)  just inside the vaginal introitus with a finger-tip every night for two weeks and then Monday, Wednesday and Friday  nights. 30 g 12  . Multiple Vitamins-Minerals ([name] PO) Take 1 tablet by mouth daily.     . Omega-3 Fatty Acids (OMEGA-3 PO) Take 1 capsule by mouth 2 (two) times daily. Once to twice daily (pt sometimes forgets 2nd dose)    . TURMERIC PO Take 1 capsule by mouth daily.      No current facility-administered medications on file prior to visit.     Allergies  Allergen Reactions  . Tussionex Pennkinetic Er [Hydrocod Polst-Cpm Polst Er] Other (See Comments)    Real dizzy headed even after a while still walking every direction but straight, patient stated she stopped taking it because of that.  . Hydrocodone     Light headed and dizziness    Social History   Social History  . Marital status: Married    Spouse name: N/A  . Number of children: N/A  . Years of education: N/A   Occupational History  . Not on file.   Social  History Main Topics  . Smoking status: Never Smoker  . Smokeless tobacco: Never Used  . Alcohol use No  . Drug use: No  . Sexual activity: Not Currently    Birth control/ protection: Post-menopausal   Other Topics Concern  . Not on file   Social History Narrative  . No narrative on file    Family History  Problem Relation Age of Onset  . Diabetes Mother   . Hypertension Mother   . Cardiomyopathy Sister   . Down syndrome Sister   . Leukemia Brother   . Alcohol abuse Son        youngest son  . Asthma Son        youngest son  . Rheum arthritis Sister   . Drug abuse Child 23  . Breast cancer Neg Hx   . Kidney cancer Neg Hx   . Bladder Cancer Neg Hx   . Prostate cancer Neg Hx     The following portions of the patient's history were reviewed and updated as appropriate: allergies, current medications, past family history, past medical history, past social history, past surgical history and problem list.  Review of Systems Review of Systems  Constitutional: Negative.   Genitourinary:       History of urinary frequency and urinary urgency No history of incomplete voiding No stress incontinence No history of UTIs Currently not sexually active due to prolapse  Musculoskeletal: Negative.   Skin: Negative.   Neurological: Negative.   Endo/Heme/Allergies: Negative.   Psychiatric/Behavioral: Negative.      Objective:   BP 137/69   Pulse 76   Ht 5\' 4"  (1.626 m)   Wt 148 lb 14.4 oz (67.5 kg)   BMI 25.56 kg/m  CONSTITUTIONAL: Well-developed, well-nourished female in no acute distress.  HENT:  Normocephalic, atraumatic.  NECK: Not examined SKIN: Skin is warm and dry. No rash noted. Not diaphoretic. No erythema. No pallor. [place]: Alert and oriented to person, place, and time. PSYCHIATRIC: Normal mood and affect. Normal behavior. Normal judgment and thought content. CARDIOVASCULAR:Not Examined RESPIRATORY: Not Examined BREASTS: Not Examined ABDOMEN: Soft, non  distended; Non tender.  No Organomegaly. PELVIC:  External Genitalia: Normal  BUS: Urethral caruncle present  Vagina: Mild/moderate atrophy; second-degree cystocele; good support at the urethrovesical junction; no rectocele  Cervix: Normal; parous; no lesions: No cervical motion tenderness  Uterus: Normal size, shape,consistency, mobile; well supported without evidence of prolapse  Adnexa: Normal; nonpalpable nontender  RV: Normal external exam Bladder: Nontender MUSCULOSKELETAL:  Normal range of motion. No tenderness.  No cyanosis, clubbing, or edema.  PROCEDURE: Pessary fitting  #3 ring with diaphragm support-SUCCESSFUL   Assessment:   1. Cystocele, midline, Second-degree  2. Urethral caruncle  3. Urinary frequency and urgency; does not desire physical therapy consultation at this time; does not desire medication therapy at this time other than vaginal estrogen.     Plan:   1. #3 ring with diaphragm support pessary. 2. Patient is to return in 1 week for insertion of pessary after he arrives 3. Continue using Premarin cream intravaginal once or twice weekly  Brayton Mars, MD  Note: This dictation was prepared with Dragon dictation along with smaller phrase technology. Any transcriptional errors that result from this process are unintentional.

## 2016-12-27 DIAGNOSIS — H353221 Exudative age-related macular degeneration, left eye, with active choroidal neovascularization: Secondary | ICD-10-CM | POA: Diagnosis not present

## 2017-01-13 ENCOUNTER — Ambulatory Visit (INDEPENDENT_AMBULATORY_CARE_PROVIDER_SITE_OTHER): Payer: Medicare Other | Admitting: Obstetrics and Gynecology

## 2017-01-13 ENCOUNTER — Encounter: Payer: Self-pay | Admitting: Obstetrics and Gynecology

## 2017-01-13 VITALS — BP 151/62 | HR 79 | Ht 64.0 in | Wt 151.4 lb

## 2017-01-13 DIAGNOSIS — N3942 Incontinence without sensory awareness: Secondary | ICD-10-CM

## 2017-01-13 DIAGNOSIS — N8111 Cystocele, midline: Secondary | ICD-10-CM

## 2017-01-13 DIAGNOSIS — N952 Postmenopausal atrophic vaginitis: Secondary | ICD-10-CM | POA: Diagnosis not present

## 2017-01-13 NOTE — Progress Notes (Signed)
Chief complaint: 1. Cystocele 2. Vaginal atrophy  Patient presents for pessary insertion. #3 ring with diaphragm pessary is inserted. Instructions are given for Trimosan gel to be inserted weekly intravaginal. She will return in 2 weeks for pessary maintenance.

## 2017-01-13 NOTE — Patient Instructions (Signed)
Return in 2 weeks for pessary maintenance

## 2017-01-27 ENCOUNTER — Ambulatory Visit (INDEPENDENT_AMBULATORY_CARE_PROVIDER_SITE_OTHER): Payer: Medicare Other | Admitting: Obstetrics and Gynecology

## 2017-01-27 ENCOUNTER — Encounter: Payer: Self-pay | Admitting: Obstetrics and Gynecology

## 2017-01-27 DIAGNOSIS — N362 Urethral caruncle: Secondary | ICD-10-CM | POA: Diagnosis not present

## 2017-01-27 DIAGNOSIS — N3942 Incontinence without sensory awareness: Secondary | ICD-10-CM

## 2017-01-27 DIAGNOSIS — N898 Other specified noninflammatory disorders of vagina: Secondary | ICD-10-CM | POA: Diagnosis not present

## 2017-01-27 DIAGNOSIS — N8111 Cystocele, midline: Secondary | ICD-10-CM

## 2017-01-27 NOTE — Patient Instructions (Signed)
1. Return in 8 weeks for pessary maintenance 2. Continue using Trimosan gel intravaginal once a week 3. Continue using Premarin cream topically to the urethral caruncle as prescribed 4. Monitor for worsening vaginal discharge symptoms. Nu swab test is obtained today and results will be made available.

## 2017-01-27 NOTE — Progress Notes (Signed)
Chief complaint: 1. Pessary maintenance 2. Vaginal discharge 3. Midline cystocele, second-degree Meredith Wilcox presents today for pessary maintenance. It is 2 weeks since the #3 ring with diaphragm support was inserted for management of symptomatic cystocele. Patient feels that she is experiencing good pelvic support but has noted an uncontrollable minimal discharge from the vagina with straining or laughing or lifting. She feels as if" her water broke". She does not feel that it is necessarily urine. The patient does not experience any vaginal odor or burning or vulvar irritation.  Past medical history, past surgical history, problem list, medications, and allergies are reviewed  OBJECTIVE: There were no vitals taken for this visit. Pleasant well-appearing female in no acute distress Abdomen: Soft, nontender without organomegaly Pelvic exam: External genitalia-normal BUS-normal Vagina-mild atrophy; no lesions or erosions; thin white secretions are seen. Cervix-parous; no lesions; no discharge Uterus-not examined Adnexa-not examined Rectovaginal-normal external exam  PROCEDURE: Pessary is removed, cleaned, and reinserted  ASSESSMENT: 1. Midline cystocele, second-degree, with good support from #3 ring with diaphragm support pessary 2. Vaginal discharge, unclear etiology 3. Normal exam  PLAN: 1. Nu swab 2. Pessary is removed, cleaned, and reinserted 3. Continue with Trimosan gel intravaginal once a week 4. Continue with Premarin cream topically to the urethral caruncle twice a week 5. Return in 8 weeks for pessary maintenance  A total of 15 minutes were spent face-to-face with the patient during this encounter and over half of that time dealt with counseling and coordination of care.  Meredith Mars, MD  Note: This dictation was prepared with Dragon dictation along with smaller phrase technology. Any transcriptional errors that result from this process are unintentional.

## 2017-01-27 NOTE — Addendum Note (Signed)
Addended by: Elouise Munroe on: 01/27/2017 02:17 PM   Modules accepted: Orders

## 2017-01-31 LAB — NUSWAB BV AND CANDIDA, NAA
Candida albicans, NAA: NEGATIVE
Candida glabrata, NAA: NEGATIVE

## 2017-02-02 NOTE — Progress Notes (Signed)
02/04/2017 10:13 AM   Meredith Wilcox 1940/11/28 [id number]  Referring provider: [name], MD [address] [place], [place] [address]  Chief Complaint  Patient presents with  . Over Active Bladder    HPI: 76 yo AAF with urge incontinence, cystocele and vaginal atrophy who presents today for a 3 month follow up.  Background history Patient is a 76 year old African American female who is referred to [name] by, Dr. [name], for bladder prolapse.  Patient states that she can feel her bladder between her legs when she walks.  This has been present for several years.  She is not experiencing stress incontinence at this time.  She is having associated urinary frequency x 10-12, urgency and nocturia x 2.   She is not having associated dysuria,  intermittency, hesitancy, straining to urinate or weak urinary stream.   She does not have a history of urinary tract infections, STI's or injury to the bladder.   She denies dysuria, gross hematuria, suprapubic pain, back pain, abdominal pain or flank pain.  She has not had any recent fevers, chills, nausea or vomiting.   She does not have a history of nephrolithiasis, GU surgery or GU trauma.   She is post menopausal.  She denies constipation and/or diarrhea.   She has not had any recent imaging studies.  She is drinking three 20 ounce bottles of water daily.   She is not drinking caffeinated beverages daily.  She is not drinking  alcoholic beverages daily.   Her PVR is 24 mL.    At her visit on 10/29/2016, she was started on Myrbetriq 25 mg and vaginal estrogen cream.  The patient has been experiencing urgency x 8 or more (unchanged), frequency x 8 or more (unchanged), is restricting fluids to avoid visits to the restroom, is engaging in toilet mapping, incontinence x 0-3 (unchanged) and nocturia x 0-3 (unchanged).   She denies dysuria, gross hematuria and suprapubic.  She denies fevers, chills, nausea and vomiting.  Her PVR is 39 mL.   She  did not take the Myrbetriq samples, but she is using the vaginal estrogen cream.   She recently had a breast biopsy and it was benign.    She has been evaluated by Dr. [name] and a pessary was inserted.  She has found this extremely helpful in her urinary symptoms.  Today, she is experiencing urgency x 0-3 (improved), frequency x 4-7 (improved), not restricting fluids to avoid visits to the restroom, is engaging in toilet mapping, incontinence x 0-3 (stable) and nocturia x 0-3 (stable).   Her PVR is 26 mL.  Her main complaint today is nocturia.  She is not having fevers, chills, nausea or vomiting.  She has not had dysuria, gross hematuria or suprapubic pain.       PMH: Past Medical History:  Diagnosis Date  . Anemia    H/O DURING PREGNANCY  . Arthritis   . Chronic kidney disease    KIDNEY PROBLEMS AROUND AGE 42  . Geographic tongue   . GERD (gastroesophageal reflux disease)    H/O NO MEDS  . Helicobacter pylori gastrointestinal tract infection   . Hyperlipidemia   . Incontinence   . Indigestion   . Prolapse of vaginal walls   . Right sided sciatica   . Tachycardia    PT STATES THAT SHE FEELS LIKE OCC HER HEART STARTS RACING  . Urethral prolapse     Surgical History: Past Surgical History:  Procedure Laterality Date  .  BREAST BIOPSY Left 10/11/2016   left breast stereo/ VASCULAR LESION  . BREAST BIOPSY Left 12/02/2016   Procedure: NEEDLE LOCALIZATION;  Surgeon: [name], MD;  Location: [facility];  Service: General;  Laterality: Left;  . BREAST EXCISIONAL BIOPSY Left 12/02/2016   left lumpectomy  . BREAST LUMPECTOMY Left 12/02/2016   Procedure: EXCISION BREAST MASS;  Surgeon: [name], MD;  Location: [facility];  Service: General;  Laterality: Left;  . DILATION AND CURETTAGE OF UTERUS    . dnc     1983  . fatty tumor removal Right    hand    Home Medications:  Allergies as of 02/04/2017      Reactions   Tussionex Pennkinetic Er [hydrocod  Polst-cpm Polst Er] Other (See Comments)   Real dizzy headed even after a while still walking every direction but straight, patient stated she stopped taking it because of that.   Hydrocodone    Light headed and dizziness      Medication List       Accurate as of 02/04/17 10:13 AM. Always use your most recent med list.          [name] PO Take 1 tablet by mouth daily.   conjugated estrogens vaginal cream Commonly known as:  PREMARIN Place 1 Applicatorful vaginally daily. Apply 0.5mg  (pea-sized amount)  just inside the vaginal introitus with a finger-tip every night for two weeks and then Monday, Wednesday and Friday nights.   OMEGA-3 PO Take 1 capsule by mouth 2 (two) times daily. Once to twice daily (pt sometimes forgets 2nd dose)   TURMERIC PO Take 1 capsule by mouth daily.   Vitamin D 2000 units tablet Take 2,000 Units by mouth daily. Reported on 06/04/2015       Allergies:  Allergies  Allergen Reactions  . Tussionex Pennkinetic Er [Hydrocod Polst-Cpm Polst Er] Other (See Comments)    Real dizzy headed even after a while still walking every direction but straight, patient stated she stopped taking it because of that.  . Hydrocodone     Light headed and dizziness    Family History: Family History  Problem Relation Age of Onset  . Diabetes Mother   . Hypertension Mother   . Cardiomyopathy Sister   . Down syndrome Sister   . Leukemia Brother   . Alcohol abuse Son        youngest son  . Asthma Son        youngest son  . Rheum arthritis Sister   . Drug abuse Child 23  . Breast cancer Neg Hx   . Kidney cancer Neg Hx   . Bladder Cancer Neg Hx   . Prostate cancer Neg Hx     Social History:  reports that she has never smoked. She has never used smokeless tobacco. She reports that she does not drink alcohol or use drugs.  ROS: UROLOGY Frequent Urination?: No Hard to postpone urination?: No Burning/pain with urination?: No Get up at night to  urinate?: Yes Leakage of urine?: No Urine stream starts and stops?: No Trouble starting stream?: No Do you have to strain to urinate?: No Blood in urine?: No Urinary tract infection?: No Sexually transmitted disease?: No Injury to kidneys or bladder?: No Painful intercourse?: No Weak stream?: No Currently pregnant?: No Vaginal bleeding?: No Last menstrual period?: n  Gastrointestinal Nausea?: No Vomiting?: No Indigestion/heartburn?: No Diarrhea?: No Constipation?: No  Constitutional Fever: No Night sweats?: No Weight loss?: No Fatigue?: No  Skin Skin rash/lesions?: No Itching?: No  Eyes Blurred vision?: No Double vision?: No  Ears/Nose/Throat Sore throat?: No Sinus problems?: No  Hematologic/Lymphatic Swollen glands?: No Easy bruising?: No  Cardiovascular Leg swelling?: No Chest pain?: No  Respiratory Cough?: No Shortness of breath?: No  Endocrine Excessive thirst?: No  Musculoskeletal Back pain?: No Joint pain?: No  Neurological Headaches?: No Dizziness?: No  Psychologic Depression?: No Anxiety?: No  Physical Exam: BP (!) 153/66   Pulse 81   Ht 5\' 3"  (1.6 m)   Wt 148 lb (67.1 kg)   BMI 26.22 kg/m   Constitutional: Well nourished. Alert and oriented, No acute distress. HEENT: [place] AT, moist mucus membranes. Trachea midline, no masses. Cardiovascular: No clubbing, cyanosis, or edema. Respiratory: Normal respiratory effort, no increased work of breathing. GI: Abdomen is soft, non tender, non distended, no abdominal masses. Liver and spleen not palpable.  No hernias appreciated.  Stool sample for occult testing is not indicated.   GU: No CVA tenderness.  No bladder fullness or masses.  Atrophic external genitalia, normal pubic hair distribution, no lesions.  Urethral caruncle is present.  No bladder fullness, tenderness or masses.  Pessary in place.   Skin: No rashes, bruises or suspicious lesions. Lymph: No cervical or inguinal  adenopathy. Neurologic: Grossly intact, no focal deficits, moving all 4 extremities. Psychiatric: Normal mood and affect.   Pertinent Imaging: Results for DANEL, STUDZINSKI (MRN [id number]) as of 02/04/2017 10:31  Ref. Range 02/04/2017 10:00  Scan Result Unknown 26    Assessment & Plan:    1. Urge incontinence  - patient feels the pessary has improved her symptoms  - RTC in 12 months for PVR and OAB questionnaire  2. Cystocele  - enjoying the pessary     3. Vaginal atrophy  - continue Premarin cream three nights weekly  - She will follow up in 12 months for an exam.     Return in about 1 year (around 02/04/2018) for OAB questionnaire, PVR and exam.  These notes generated with voice recognition software. I apologize for typographical errors.  Zara Council, [facility] [address], [place], [place] [address] [phone]

## 2017-02-04 ENCOUNTER — Encounter: Payer: Self-pay | Admitting: Urology

## 2017-02-04 ENCOUNTER — Ambulatory Visit (INDEPENDENT_AMBULATORY_CARE_PROVIDER_SITE_OTHER): Payer: Medicare Other | Admitting: Urology

## 2017-02-04 VITALS — BP 153/66 | HR 81 | Ht 63.0 in | Wt 148.0 lb

## 2017-02-04 DIAGNOSIS — N8111 Cystocele, midline: Secondary | ICD-10-CM

## 2017-02-04 DIAGNOSIS — N952 Postmenopausal atrophic vaginitis: Secondary | ICD-10-CM | POA: Diagnosis not present

## 2017-02-04 DIAGNOSIS — N3941 Urge incontinence: Secondary | ICD-10-CM | POA: Diagnosis not present

## 2017-02-04 LAB — BLADDER SCAN AMB NON-IMAGING: Scan Result: 26

## 2017-02-08 ENCOUNTER — Ambulatory Visit (INDEPENDENT_AMBULATORY_CARE_PROVIDER_SITE_OTHER): Payer: Medicare Other | Admitting: General Surgery

## 2017-02-08 ENCOUNTER — Inpatient Hospital Stay: Payer: Self-pay

## 2017-02-08 VITALS — BP 138/60 | HR 72 | Resp 12 | Ht 66.0 in | Wt 148.0 lb

## 2017-02-08 DIAGNOSIS — N6342 Unspecified lump in left breast, subareolar: Secondary | ICD-10-CM

## 2017-02-08 DIAGNOSIS — N6322 Unspecified lump in the left breast, upper inner quadrant: Secondary | ICD-10-CM

## 2017-02-08 NOTE — Patient Instructions (Signed)
  Patient to have a left breast diagnotic mammogram in December,2018 . The patient is aware to call back for any questions or concerns.

## 2017-02-08 NOTE — Progress Notes (Signed)
Patient ID: Meredith Wilcox, female   DOB: 1940/04/26, 76 y.o.   MRN: [id number]  No chief complaint on file.   HPI Meredith Wilcox is a 76 y.o. female here today for her two month follow up left breast wide excision done on 12/02/2016 and to check a mass in her left breast Patient states she is doing well.  HPI  Past Medical History:  Diagnosis Date  . Anemia    H/O DURING PREGNANCY  . Arthritis   . Chronic kidney disease    KIDNEY PROBLEMS AROUND AGE 38  . Geographic tongue   . GERD (gastroesophageal reflux disease)    H/O NO MEDS  . Helicobacter pylori gastrointestinal tract infection   . Hyperlipidemia   . Incontinence   . Indigestion   . Prolapse of vaginal walls   . Right sided sciatica   . Tachycardia    PT STATES THAT SHE FEELS LIKE OCC HER HEART STARTS RACING  . Urethral prolapse     Past Surgical History:  Procedure Laterality Date  . BREAST BIOPSY Left 10/11/2016   left breast stereo/ VASCULAR LESION  . BREAST BIOPSY Left 12/02/2016   Procedure: NEEDLE LOCALIZATION;  Surgeon: [name], MD;  Location: [facility];  Service: General;  Laterality: Left;  . BREAST EXCISIONAL BIOPSY Left 12/02/2016   left lumpectomy  . BREAST LUMPECTOMY Left 12/02/2016   Procedure: EXCISION BREAST MASS;  Surgeon: [name], MD;  Location: [facility];  Service: General;  Laterality: Left;  . DILATION AND CURETTAGE OF UTERUS    . dnc     1983  . fatty tumor removal Right    hand    Family History  Problem Relation Age of Onset  . Diabetes Mother   . Hypertension Mother   . Cardiomyopathy Sister   . Down syndrome Sister   . Leukemia Brother   . Alcohol abuse Son        youngest son  . Asthma Son        youngest son  . Rheum arthritis Sister   . Drug abuse Child 23  . Breast cancer Neg Hx   . Kidney cancer Neg Hx   . Bladder Cancer Neg Hx   . Prostate cancer Neg Hx     Social History Social History  Substance Use Topics  . Smoking status: Never Smoker  .  Smokeless tobacco: Never Used  . Alcohol use No    Allergies  Allergen Reactions  . Tussionex Pennkinetic Er [Hydrocod Polst-Cpm Polst Er] Other (See Comments)    Real dizzy headed even after a while still walking every direction but straight, patient stated she stopped taking it because of that.  . Hydrocodone     Light headed and dizziness    Current Outpatient Prescriptions  Medication Sig Dispense Refill  . Cholecalciferol (VITAMIN D) 2000 UNITS tablet Take 2,000 Units by mouth daily. Reported on 06/04/2015    . conjugated estrogens (PREMARIN) vaginal cream Place 1 Applicatorful vaginally daily. Apply 0.5mg  (pea-sized amount)  just inside the vaginal introitus with a finger-tip every night for two weeks and then Monday, Wednesday and Friday nights. 30 g 12  . Multiple Vitamins-Minerals ([name] PO) Take 1 tablet by mouth daily.     . Omega-3 Fatty Acids (OMEGA-3 PO) Take 1 capsule by mouth 2 (two) times daily. Once to twice daily (pt sometimes forgets 2nd dose)    . TURMERIC PO Take 1 capsule by mouth daily.  No current facility-administered medications for this visit.     Review of Systems Review of Systems  Constitutional: Negative.   HENT: Negative.   Respiratory: Negative.   Cardiovascular: Negative.     Blood pressure 138/60, pulse 72, resp. rate 12, height 5\' 6"  (1.676 m), weight 148 lb (67.1 kg).  Physical Exam Physical Exam  Constitutional: She is oriented to person, place, and time. She appears well-developed and well-nourished.  Pulmonary/Chest: Right breast exhibits no inverted nipple, no mass, no nipple discharge, no skin change and no tenderness. Left breast exhibits no inverted nipple, no mass, no nipple discharge, no skin change and no tenderness. Breasts are symmetrical.    Lymphadenopathy:    She has no cervical adenopathy.    She has no axillary adenopathy.  Neurological: She is alert and oriented to person, place, and time.  Skin: Skin is  warm and dry.   Ultrasound of the left areolar region in the inferior aspect was performed.  The previously noted small ovoid 6 mm mass is again seen but mostly in the antiradial view.  Maximum measurement is 6.6 mm. It is located around the 5:00 location.  No associated shadowing or through transmission.  A a single duct seen going towards the nipple but it is not clear if it connects with this mass. 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  Small retroareolar nodule appeared to be stable in size likely benign.    Plan   To ensure stability of the mass in the retroareolar region on the left and a follow-up mammogram of the left breast has been scheduled for 2 months    Patient to have a left breast diagnotic mammogram in December,2018 . The patient is aware to call back for any questions or concerns.  HPI, Physical Exam, Assessment and Plan have been scribed under the direction and in the presence of Mckinley Jewel, MD  Gaspar Cola, CMA I have completed the exam and reviewed the above documentation for accuracy and completeness.  I agree with the above.  Haematologist has been used and any errors in dictation or transcription are unintentional.  Aashi Derrington G. Jamal Collin, M.D., F.A.C.S.      Junie Panning G 02/09/2017, 9:06 AM

## 2017-03-15 ENCOUNTER — Other Ambulatory Visit: Payer: Medicare Other

## 2017-03-22 ENCOUNTER — Ambulatory Visit: Payer: Medicare Other | Admitting: General Surgery

## 2017-03-23 ENCOUNTER — Encounter: Payer: Self-pay | Admitting: *Deleted

## 2017-03-24 ENCOUNTER — Encounter: Payer: Self-pay | Admitting: Obstetrics and Gynecology

## 2017-03-24 ENCOUNTER — Ambulatory Visit (INDEPENDENT_AMBULATORY_CARE_PROVIDER_SITE_OTHER): Payer: Medicare Other | Admitting: Obstetrics and Gynecology

## 2017-03-24 VITALS — BP 152/72 | HR 77 | Ht 66.0 in | Wt 151.0 lb

## 2017-03-24 DIAGNOSIS — N8111 Cystocele, midline: Secondary | ICD-10-CM

## 2017-03-24 DIAGNOSIS — N898 Other specified noninflammatory disorders of vagina: Secondary | ICD-10-CM

## 2017-03-24 DIAGNOSIS — N952 Postmenopausal atrophic vaginitis: Secondary | ICD-10-CM

## 2017-03-24 NOTE — Progress Notes (Signed)
Chief complaint: 1.  Pessary maintenance 2.  Midline cystocele, second-degree 3.  Persistent vaginal discharge  Patient presents today for 8-week follow-up-pessary maintenance # 3  Ring with diaphragm support pessary is being utilized for management of symptomatic cystocele.  She does report good pelvic support.  She is voiding adequately without significant leakage.  She notes only occasional urgency at night when she wakes up in the middle of the night to void The patient is experiencing the chronic white discharge without vulvar itching or burning.  At last visit Nuswab was performed and was negative. Patient desires a break from pessary use at this time.  Past medical history: Past surgical history, problem list, medications, and allergies are reviewed  OBJECTIVE: BP (!) 152/72   Pulse 77   Ht 5\' 6"  (1.676 m)   Wt 151 lb (68.5 kg)   BMI 24.37 kg/m  Pleasant well-appearing female in no acute distress.  Alert and oriented. Abdomen: Soft, nontender, no organomegaly Pelvic exam: Sternal genitalia-normal BUS-normal Vagina-mild atrophic changes; mild grayish discharge, no lesions or erosions. Cervix-parous; no significant lesions or discharge; no cervical motion tenderness Uterus-midplane, normal size shape, mobile, nontender Adnexa-nonpalpable and nontender Rectovaginal-normal external exam  ASSESSMENT: 1.  Midline cystocele, second-degree, previously managed with pessary 2.  Chronic vaginal discharge, unclear etiology; vaginitis screening at last visit was negative 3.  Patient desires break from using pessary at this time  PLAN: 1.  The pessary is removed-#3 ring with diaphragm support 2.  Suspended using Trimosan gel intravaginal at this time 3.  Return in 8 weeks for reassessment and possible resumption of pessary use. 4.  Return sooner if pessary insertion is desired sooner than 8 weeks.  A total of 15 minutes were spent face-to-face with the patient during this encounter  and over half of that time dealt with counseling and coordination of care.  Brayton Mars, MD  Note: This dictation was prepared with Dragon dictation along with smaller phrase technology. Any transcriptional errors that result from this process are unintentional.

## 2017-03-24 NOTE — Patient Instructions (Signed)
1.  The pessary is removed today 2.  No further use of Trimosan gel when the pessary is out 3.  Return in 8 weeks for follow-up and possible insertion of pessary. 4.  Call sooner if pessary is to be replaced before 8 weeks.

## 2017-05-03 DIAGNOSIS — H353221 Exudative age-related macular degeneration, left eye, with active choroidal neovascularization: Secondary | ICD-10-CM | POA: Diagnosis not present

## 2017-05-19 ENCOUNTER — Encounter: Payer: Medicare Other | Admitting: Obstetrics and Gynecology

## 2017-06-15 ENCOUNTER — Encounter: Payer: Medicare Other | Admitting: Obstetrics and Gynecology

## 2017-06-30 DIAGNOSIS — H2511 Age-related nuclear cataract, right eye: Secondary | ICD-10-CM | POA: Diagnosis not present

## 2017-07-20 ENCOUNTER — Emergency Department
Admission: EM | Admit: 2017-07-20 | Discharge: 2017-07-21 | Disposition: A | Discharge status: Home / Self Care | Payer: Medicare Other | Attending: Emergency Medicine | Admitting: Emergency Medicine

## 2017-07-20 ENCOUNTER — Emergency Department: Payer: Medicare Other

## 2017-07-20 DIAGNOSIS — M79662 Pain in left lower leg: Secondary | ICD-10-CM | POA: Diagnosis not present

## 2017-07-20 DIAGNOSIS — Z79899 Other long term (current) drug therapy: Secondary | ICD-10-CM | POA: Diagnosis not present

## 2017-07-20 DIAGNOSIS — M25562 Pain in left knee: Secondary | ICD-10-CM | POA: Insufficient documentation

## 2017-07-20 DIAGNOSIS — M7989 Other specified soft tissue disorders: Secondary | ICD-10-CM | POA: Diagnosis not present

## 2017-07-20 DIAGNOSIS — I82402 Acute embolism and thrombosis of unspecified deep veins of left lower extremity: Secondary | ICD-10-CM | POA: Diagnosis not present

## 2017-07-20 DIAGNOSIS — N189 Chronic kidney disease, unspecified: Secondary | ICD-10-CM | POA: Insufficient documentation

## 2017-07-20 DIAGNOSIS — I8290 Acute embolism and thrombosis of unspecified vein: Secondary | ICD-10-CM | POA: Insufficient documentation

## 2017-07-20 MED ORDER — KETOROLAC TROMETHAMINE 30 MG/ML IJ SOLN
30.0000 mg | Freq: Once | INTRAMUSCULAR | Status: AC
  Administered 2017-07-21: 30 mg via INTRAMUSCULAR
  Filled 2017-07-20: qty 1

## 2017-07-20 NOTE — ED Triage Notes (Signed)
Patient c/o left knee pain for several weeks. Patient has been trying home remedies with no issue. Patient denies injury to the area.

## 2017-07-21 DIAGNOSIS — M79662 Pain in left lower leg: Secondary | ICD-10-CM | POA: Diagnosis not present

## 2017-07-21 DIAGNOSIS — M7989 Other specified soft tissue disorders: Secondary | ICD-10-CM | POA: Diagnosis not present

## 2017-07-21 DIAGNOSIS — M25562 Pain in left knee: Secondary | ICD-10-CM | POA: Diagnosis not present

## 2017-07-21 MED ORDER — OXYCODONE-ACETAMINOPHEN 5-325 MG PO TABS
1.0000 | ORAL_TABLET | Freq: Once | ORAL | Status: AC
  Administered 2017-07-21: 1 via ORAL
  Filled 2017-07-21: qty 1

## 2017-07-21 NOTE — ED Notes (Signed)
ED Provider at bedside. 

## 2017-07-21 NOTE — ED Provider Notes (Signed)
[facility] Emergency Department Provider Note  ____________________________________________   First MD Initiated Contact with Patient 07/21/17 0021     (approximate)  I have reviewed the triage vital signs and the nursing notes.   HISTORY  Chief Complaint Knee Pain   HPI Meredith Wilcox is a 77 y.o. female who self presents to the emergency department with 2 weeks of painful swelling just below her left knee.  Denies trauma.  No fevers or chills.  Pain is now moderate to severe and worse with movement.  She has no history of DVT or pulmonary embolism however she is concerned she might have a blood clot.  She has not gone to the dentist in over 6 months.  Nothing seems to make her symptoms better or worse.  Past Medical History:  Diagnosis Date  . Anemia    H/O DURING PREGNANCY  . Arthritis   . Chronic kidney disease    KIDNEY PROBLEMS AROUND AGE 9  . Geographic tongue   . GERD (gastroesophageal reflux disease)    H/O NO MEDS  . Helicobacter pylori gastrointestinal tract infection   . Hyperlipidemia   . Incontinence   . Indigestion   . Prolapse of vaginal walls   . Right sided sciatica   . Tachycardia    PT STATES THAT SHE FEELS LIKE OCC HER HEART STARTS RACING  . Urethral prolapse     Patient Active Problem List   Diagnosis Date Noted  . Vaginal discharge 01/27/2017  . 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 Cystocele, midline 12/21/2016  . Vaginal atrophy 12/21/2016  . 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 History of Helicobacter pylori infection 11/16/2014  . 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 Incontinence 11/16/2014  . 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 BREAST BIOPSY Left 10/11/2016   left breast stereo/ VASCULAR LESION  . BREAST BIOPSY Left 12/02/2016   Procedure: NEEDLE LOCALIZATION;  Surgeon: [name], MD;  Location: [facility] ORS;  Service: General;  Laterality: Left;  . BREAST EXCISIONAL BIOPSY Left 12/02/2016   left lumpectomy  . BREAST LUMPECTOMY Left 12/02/2016   Procedure: EXCISION BREAST MASS;  Surgeon: [name], MD;  Location: [facility] ORS;  Service: General;  Laterality: Left;  . DILATION AND CURETTAGE OF UTERUS    . dnc     1983  . fatty tumor removal Right    hand    Prior to Admission medications   Medication Sig Start Date End Date Taking? Authorizing Provider  Cholecalciferol (VITAMIN D) 2000 UNITS tablet Take 2,000 Units by mouth daily. Reported on 06/04/2015 12/30/10   [provider]  conjugated estrogens (PREMARIN) vaginal cream Place 1 Applicatorful vaginally daily. Apply 0.5mg  (pea-sized amount)  just inside the vaginal introitus with a finger-tip every night for two weeks and then Monday, Wednesday and Friday nights. 10/29/16   Zara Council A, PA-C  Multiple Vitamins-Minerals ([name] PO) Take 1 tablet by mouth daily.     [provider]  Omega-3 Fatty Acids (OMEGA-3 PO) Take 1 capsule by mouth 2 (two) times daily. Once to twice daily (pt sometimes forgets 2nd dose)    [provider]  OXYQUINOLONE SULFATE VAGINAL (TRIMO-SAN) 0.025 % GEL Place vaginally.    [provider]  TURMERIC PO Take 1 capsule by mouth daily.     [provider]    Allergies Tussionex pennkinetic  er [hydrocod polst-cpm polst er] and Hydrocodone  Family History  Problem Relation Age of Onset  . Diabetes Mother   . Hypertension Mother   . Cardiomyopathy Sister   . Down syndrome Sister   . Leukemia Brother   . Alcohol abuse Son        youngest son  . Asthma Son        youngest son  . Rheum arthritis Sister   . Drug abuse Child 23  . Breast cancer Neg Hx   . Kidney cancer Neg Hx   . Bladder Cancer Neg Hx   . Prostate cancer Neg Hx     Social History Social History   Tobacco Use  . Smoking status: Never Smoker  .  Smokeless tobacco: Never Used  Substance Use Topics  . Alcohol use: No    Alcohol/week: 0.0 oz  . Drug use: No    Review of Systems Constitutional: No fever/chills ENT: No sore throat. Cardiovascular: Denies chest pain. Respiratory: Denies shortness of breath. Gastrointestinal: No abdominal pain.  No nausea, no vomiting.  No diarrhea.  No constipation. Musculoskeletal: Positive for left knee pain. Neurological: Negative for headaches   ____________________________________________   PHYSICAL EXAM:  VITAL SIGNS: ED Triage Vitals  Enc Vitals Group     BP 07/20/17 2221 (!) 120/58     Pulse Rate 07/20/17 2221 95     Resp 07/20/17 2221 18     Temp 07/20/17 2221 99.3 F (37.4 C)     Temp Source 07/20/17 2221 Oral     SpO2 07/20/17 2221 100 %     Weight 07/20/17 2222 150 lb (68 kg)     Height --      Head Circumference --      Peak Flow --      Pain Score 07/20/17 2222 7     Pain Loc --      Pain Edu? --      Excl. in [place]? --     Constitutional: Alert and oriented x4 appears extremely uncomfortable nontoxic no diaphoresis speaks in full clear sentences Head: Atraumatic. Nose: No congestion/rhinnorhea. Mouth/Throat: No trismus Neck: No stridor.   Respiratory: Normal respiratory effort.  No retractions. MSK: No effusion in left knee.  Able to range the knee.  Not warm or tender.  She is quite tender superior posterior calf although legs are equal in size Neurologic:  Normal speech and language. No gross focal neurologic deficits are appreciated.  Skin:  Skin is warm, dry and intact. No rash noted.    ____________________________________________  LABS (all labs ordered are listed, but only abnormal results are displayed)  Labs Reviewed  BASIC METABOLIC PANEL     __________________________________________  EKG   ____________________________________________  RADIOLOGY  X-ray of the left knee reviewed by me with no acute disease Ultrasound of the left lower  extremity reviewed by me with no beat DVT but does have calf thromboses ____________________________________________   DIFFERENTIAL includes but not limited to  DVT, Baker's cyst, gout, septic joint   PROCEDURES  Procedure(s) performed: no  Procedures  Critical Care performed: no  Observation: no ____________________________________________   INITIAL IMPRESSION / ASSESSMENT AND PLAN / ED COURSE  Pertinent labs & imaging results that were available during my care of the patient were reviewed by me and considered in my medical decision making (see chart for details).  When I saw the patient she was in a significant amount of discomfort so Percocet given which seemed to improve her  symptoms.  Her left lower extremity ultrasound showed no DVT but did show an isolated calf thrombosis.  I had a lengthy discussion with the patient and family regarding the risks and benefits of anticoagulation now versus repeat ultrasound in 1 week to evaluate for the propagation of the clot.  After discussion they have decided not to anticoagulate at this time which I think is entirely reasonable.  Nonsteroidals and hot compresses for now along with repeat ultrasound with primary care within 1 week.  Patient verbalizes understanding and agreement the plan.      ____________________________________________   FINAL CLINICAL IMPRESSION(S) / ED DIAGNOSES  Final diagnoses:  Acute pain of left knee  Blood clot in vein      NEW MEDICATIONS STARTED DURING THIS VISIT:  Discharge Medication List as of 07/21/2017  4:17 AM       Note:  This document was prepared using Dragon voice recognition software and may include unintentional dictation errors.      Darel Hong, MD 07/21/17 5701    Merlyn Lot, MD 08/05/17 1102

## 2017-07-21 NOTE — ED Provider Notes (Signed)
[facility] Emergency Department Provider Note  ____________________________________________   First MD Initiated Contact with Patient 07/20/17 2305     (approximate)  I have reviewed the triage vital signs and the nursing notes.   HISTORY  Chief Complaint Knee Pain    HPI Meredith Wilcox is a 77 y.o. female presents emergency department complaining of left knee pain for several weeks.  She states she is been trying home remedies without any relief.  States the pain got a lot worse this past week.  She is concerned she has a blood clot in her lower leg.  She denies any fever or chills.  She denies chest pain or shortness of breath.  Past Medical History:  Diagnosis Date  . Anemia    H/O DURING PREGNANCY  . Arthritis   . Chronic kidney disease    KIDNEY PROBLEMS AROUND AGE 56  . Geographic tongue   . GERD (gastroesophageal reflux disease)    H/O NO MEDS  . Helicobacter pylori gastrointestinal tract infection   . Hyperlipidemia   . Incontinence   . Indigestion   . Prolapse of vaginal walls   . Right sided sciatica   . Tachycardia    PT STATES THAT SHE FEELS LIKE OCC HER HEART STARTS RACING  . Urethral prolapse     Patient Active Problem List   Diagnosis Date Noted  . Vaginal discharge 01/27/2017  . 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 Cystocele, midline 12/21/2016  . Vaginal atrophy 12/21/2016  . 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 History of Helicobacter pylori infection 11/16/2014  . 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 Incontinence 11/16/2014  . 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 BREAST BIOPSY Left 10/11/2016   left breast stereo/ VASCULAR LESION  . BREAST BIOPSY Left 12/02/2016   Procedure: NEEDLE LOCALIZATION;  Surgeon: [name], MD;  Location: [facility] ORS;  Service: General;  Laterality: Left;  .  BREAST EXCISIONAL BIOPSY Left 12/02/2016   left lumpectomy  . BREAST LUMPECTOMY Left 12/02/2016   Procedure: EXCISION BREAST MASS;  Surgeon: [name], MD;  Location: [facility] ORS;  Service: General;  Laterality: Left;  . DILATION AND CURETTAGE OF UTERUS    . dnc     1983  . fatty tumor removal Right    hand    Prior to Admission medications   Medication Sig Start Date End Date Taking? Authorizing Provider  Cholecalciferol (VITAMIN D) 2000 UNITS tablet Take 2,000 Units by mouth daily. Reported on 06/04/2015 12/30/10   [provider]  conjugated estrogens (PREMARIN) vaginal cream Place 1 Applicatorful vaginally daily. Apply 0.5mg  (pea-sized amount)  just inside the vaginal introitus with a finger-tip every night for two weeks and then Monday, Wednesday and Friday nights. 10/29/16   Zara Council A, PA-C  Multiple Vitamins-Minerals ([name] PO) Take 1 tablet by mouth daily.     [provider]  Omega-3 Fatty Acids (OMEGA-3 PO) Take 1 capsule by mouth 2 (two) times daily. Once to twice daily (pt sometimes forgets 2nd dose)    [provider]  OXYQUINOLONE SULFATE VAGINAL (TRIMO-SAN) 0.025 % GEL Place vaginally.    [provider]  TURMERIC PO Take 1 capsule by mouth daily.     [provider]    Allergies Tussionex pennkinetic er [hydrocod polst-cpm polst er] and Hydrocodone  Family History  Problem Relation Age of  Onset  . Diabetes Mother   . Hypertension Mother   . Cardiomyopathy Sister   . Down syndrome Sister   . Leukemia Brother   . Alcohol abuse Son        youngest son  . Asthma Son        youngest son  . Rheum arthritis Sister   . Drug abuse Child 23  . Breast cancer Neg Hx   . Kidney cancer Neg Hx   . Bladder Cancer Neg Hx   . Prostate cancer Neg Hx     Social History Social History   Tobacco Use  . Smoking status: Never Smoker  . Smokeless tobacco: Never Used  Substance Use Topics  . Alcohol use: No     Alcohol/week: 0.0 oz  . Drug use: No    Review of Systems  Constitutional: No fever/chills Eyes: No visual changes. ENT: No sore throat. Respiratory: Denies cough Genitourinary: Negative for dysuria. Musculoskeletal: Negative for back pain.  Positive for left knee pain Skin: Negative for rash.    ____________________________________________   PHYSICAL EXAM:  VITAL SIGNS: ED Triage Vitals  Enc Vitals Group     BP 07/20/17 2221 (!) 120/58     Pulse Rate 07/20/17 2221 95     Resp 07/20/17 2221 18     Temp 07/20/17 2221 99.3 F (37.4 C)     Temp Source 07/20/17 2221 Oral     SpO2 07/20/17 2221 100 %     Weight 07/20/17 2222 150 lb (68 kg)     Height --      Head Circumference --      Peak Flow --      Pain Score 07/20/17 2222 7     Pain Loc --      Pain Edu? --      Excl. in [place]? --     Constitutional: Alert and oriented. Well appearing and in no acute distress.  Patient appears to be uncomfortable Eyes: Conjunctivae are normal.  Head: Atraumatic. Nose: No congestion/rhinnorhea. Mouth/Throat: Mucous membranes are moist.   Cardiovascular: Normal rate, regular rhythm.  Heart sounds are normal Respiratory: Normal respiratory effort.  No retractions, lungs clear to auscultation GU: deferred Musculoskeletal: Decreased range of motion of the left knee.  The left knee is swollen and tender to palpation.  She is also tender in the upper calf and posterior knee.  No cord is felt.  Neurovascularly she is intact Neurologic:  Normal speech and language.  Skin:  Skin is warm, dry and intact. No rash noted. Psychiatric: Mood and affect are normal. Speech and behavior are normal.  ____________________________________________   LABS (all labs ordered are listed, but only abnormal results are displayed)  Labs Reviewed - No data to display ____________________________________________   ____________________________________________  RADIOLOGY X-ray of the left knee shows  degenerative changes Ultrasound of the left knee  ____________________________________________   PROCEDURES  Procedure(s) performed: no  Procedures    ____________________________________________   INITIAL IMPRESSION / ASSESSMENT AND PLAN / ED COURSE  Pertinent labs & imaging results that were available during my care of the patient were reviewed by me and considered in my medical decision making (see chart for details).  Patient 77 year old female presents emergency department complaining of left knee pain for several weeks.  She is used over-the-counter and home remedies.  She is worried she has a blood clot due to the swelling in the knee.  On physical exam the left knee is tender and swollen with  questionable effusion.  Posteriorly at the calf and the knee she is tender and there is some increased warmth.  X-ray of the left knee shows degenerative changes.  Ultrasound of the left knee is pending at this time.   Patient was given Toradol 30 mg IM for pain control.  She is given an ice pack to apply to the knee.  X-ray results and treatment plan were discussed with the patient and her family members.  Patient was transferred to Dr. Mable Paris at the end of my shift.  As part of my medical decision making, I reviewed the following data within the [place] History obtained from family, Nursing notes reviewed and incorporated, Radiograph reviewed x-ray of the left knee is negative for acute abnormality, Notes from prior ED visits and [place] Controlled Substance Database  ____________________________________________   FINAL CLINICAL IMPRESSION(S) / ED DIAGNOSES  Final diagnoses:  Acute pain of left knee      NEW MEDICATIONS STARTED DURING THIS VISIT:  New Prescriptions   No medications on file     Note:  This document was prepared using Dragon voice recognition software and may include unintentional dictation errors.    Versie Starks,  PA-C 07/21/17 0020    Darel Hong, MD 07/21/17 [phone]

## 2017-07-22 ENCOUNTER — Telehealth: Payer: Self-pay | Admitting: Family Medicine

## 2017-07-22 NOTE — Telephone Encounter (Signed)
Copied from [place] [id number]. Topic: Quick Communication - See Telephone Encounter >> Jul 22, 2017 11:10 AM Arletha Grippe wrote: CRM for notification. See Telephone encounter for: 07/22/17. Pt is taking percocet., she has taken 2 tabs. Pt is not feeling any better. The percocet is making her feel nauseous.  She has 7 left.  Her leg pain is worse in both legs.   Cb is [phone]

## 2017-07-22 NOTE — Telephone Encounter (Signed)
Patient notified and will keep appointment on next week

## 2017-07-22 NOTE — Telephone Encounter (Signed)
Patient stated she went to ER on on Thursday morning for a small blood clot on left leg behind knee. The percocet is making her vomiting and not helping with pain. Has tried to take half a tablet and its not working for her. Has appointment on April 17th here and Ultrasound appointment coming up. Have a old script of Tramadol that do not expire til August can she use that in place of Percocet.

## 2017-07-25 ENCOUNTER — Ambulatory Visit: Payer: Self-pay

## 2017-07-25 DIAGNOSIS — M1712 Unilateral primary osteoarthritis, left knee: Secondary | ICD-10-CM | POA: Diagnosis not present

## 2017-07-25 NOTE — Telephone Encounter (Signed)
Pt. Was seen in ED 07/20/17 and diagnosed with blood clot in her left leg. Husband called concerned with the blood clot, but pt. States she "actually feels a little better." Pain medication is working. She will continue to monitor her condition. Instructed to call or go to ED if she increased pain/swelling.Verbalizes understanding.  Answer Assessment - Initial Assessment Questions 1. ONSET: "When did the pain start?"      Started last week 2. LOCATION: "Where is the pain located?"      Left lower leg 3. PAIN: "How bad is the pain?"    (Scale 1-10; or mild, moderate, severe)   -  MILD (1-3): doesn't interfere with normal activities    -  MODERATE (4-7): interferes with normal activities (e.g., work or school) or awakens from sleep, limping    -  SEVERE (8-10): excruciating pain, unable to do any normal activities, unable to walk     Moderate 4. WORK OR EXERCISE: "Has there been any recent work or exercise that involved this part of the body?"      No 5. CAUSE: "What do you think is causing the leg pain?"     Blood clot left leg 6. OTHER SYMPTOMS: "Do you have any other symptoms?" (e.g., chest pain, back pain, breathing difficulty, swelling, rash, fever, numbness, weakness)     Leg is more swollen and painful  7. PREGNANCY: "Is there any chance you are pregnant?" "When was your last menstrual period?"     No  Protocols used: LEG PAIN-A-AH

## 2017-07-28 ENCOUNTER — Encounter: Payer: Self-pay | Admitting: Nurse Practitioner

## 2017-07-28 ENCOUNTER — Ambulatory Visit (INDEPENDENT_AMBULATORY_CARE_PROVIDER_SITE_OTHER): Payer: Medicare Other | Admitting: Nurse Practitioner

## 2017-07-28 VITALS — BP 122/74 | HR 82 | Temp 98.7°F | Resp 18 | Ht 66.0 in | Wt 151.0 lb

## 2017-07-28 DIAGNOSIS — I8290 Acute embolism and thrombosis of unspecified vein: Secondary | ICD-10-CM | POA: Diagnosis not present

## 2017-07-28 DIAGNOSIS — M25561 Pain in right knee: Secondary | ICD-10-CM

## 2017-07-28 NOTE — Patient Instructions (Signed)
-   sent [name] results, will get phone call about scheduling  Knee Pain, Adult Many things can cause knee pain. The pain often goes away on its own with time and rest. If the pain does not go away, tests may be done to find out what is causing the pain. Follow these instructions at home: Activity  Rest your knee.  Do not do things that cause pain.  Avoid activities where both feet leave the ground at the same time (high-impact activities). Examples are running, jumping rope, and doing jumping jacks. General instructions  Take medicines only as told by your doctor.  Raise (elevate) your knee when you are resting. Make sure your knee is higher than your heart.  Sleep with a pillow under your knee.  If told, put ice on the knee: ? Put ice in a plastic bag. ? Place a towel between your skin and the bag. ? Leave the ice on for 20 minutes, 2-3 times a day.  Ask your doctor if you should wear an elastic knee support.  Lose weight if you are overweight. Being overweight can make your knee hurt more.  Do not use any tobacco products. These include cigarettes, chewing tobacco, or electronic cigarettes. If you need help quitting, ask your doctor. Smoking may slow down healing. Contact a doctor if:  The pain does not stop.  The pain changes or gets worse.  You have a fever along with knee pain.  Your knee gives out or locks up.  Your knee swells, and becomes worse. Get help right away if:  Your knee feels warm.  You cannot move your knee.  You have very bad knee pain.  You have chest pain.  You have trouble breathing. Summary  Many things can cause knee pain. The pain often goes away on its own with time and rest.  Avoid activities that put stress on your knee. These include running and jumping rope.  Get help right away if you cannot move your knee, or if your knee feels warm, or if you have trouble breathing. This information is not intended to replace advice given to you  by your health care provider. Make sure you discuss any questions you have with your health care provider. Document Released: 06/25/2008 Document Revised: 03/23/2016 Document Reviewed: 03/23/2016 Elsevier Interactive Patient Education  2017 [employer].

## 2017-07-28 NOTE — Progress Notes (Addendum)
Name: Meredith Wilcox   MRN: [id number]    DOB: January 04, 1941   Date:07/28/2017       Progress Note  Subjective  Chief Complaint  Chief Complaint  Patient presents with  . Follow-up    ER for left knee pain    HPI  Patient presented to the ER on 07/21/2017 for 2 weeks of left knee pain and swelling with no trauma but was concerned for blood clot despite no history of such.  X-ray was negative for acute findings, vascular study showed Paired thrombosed vessels in upper left calf.  After shared decision making decided not to anticoagulate.  Plan was for NSAIDs, hot compress and repeat ultrasound one week later. Went to Ortho on Monday- [facility] for knee pain- given knee sleeve, take NSAIDs and use hot compresses. Patient states overall pain has significantly improved; swelling has resolved. Still using crutches due to increase support states not painful with weight bearing. Steady gait without crutches. Exercising and stretching lightly. Has PT that starts on May 1st. Denies redness, heat, streaking, injury, calf pain has resolved, paresthesia, cp, shob.    Patient Active Problem List   Diagnosis Date Noted  . Vaginal discharge 01/27/2017  . 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 Cystocele, midline 12/21/2016  . Vaginal atrophy 12/21/2016  . 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 History of Helicobacter pylori infection 11/16/2014  . 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 Incontinence 11/16/2014  . Prolapse of urethra 11/16/2014    Past Medical History:  Diagnosis Date  . Anemia    H/O DURING PREGNANCY  . Arthritis   . Chronic kidney disease    KIDNEY PROBLEMS AROUND AGE 2  . Geographic tongue   . GERD (gastroesophageal reflux disease)    H/O NO MEDS  . Helicobacter pylori gastrointestinal tract infection   . Hyperlipidemia   . Incontinence   . Indigestion   . Prolapse of vaginal walls   . Right sided sciatica   .  Tachycardia   . Urethral prolapse     Past Surgical History:  Procedure Laterality Date  . BREAST BIOPSY Left 10/11/2016   left breast stereo/ VASCULAR LESION  . BREAST BIOPSY Left 12/02/2016   Procedure: NEEDLE LOCALIZATION;  Surgeon: [name], MD;  Location: [facility] ORS;  Service: General;  Laterality: Left;  . BREAST EXCISIONAL BIOPSY Left 12/02/2016   left lumpectomy  . BREAST LUMPECTOMY Left 12/02/2016   Procedure: EXCISION BREAST MASS;  Surgeon: [name], MD;  Location: [facility] ORS;  Service: General;  Laterality: Left;  . DILATION AND CURETTAGE OF UTERUS    . dnc     1983  . fatty tumor removal Right    hand    Social History   Tobacco Use  . Smoking status: Never Smoker  . Smokeless tobacco: Never Used  Substance Use Topics  . Alcohol use: No    Alcohol/week: 0.0 oz     Current Outpatient Medications:  .  Cholecalciferol (VITAMIN D) 2000 UNITS tablet, Take 2,000 Units by mouth daily. Reported on 06/04/2015, Disp: , Rfl:  .  conjugated estrogens (PREMARIN) vaginal cream, Place 1 Applicatorful vaginally daily. Apply 0.5mg  (pea-sized amount)  just inside the vaginal introitus with a finger-tip every night for two weeks and then Monday, Wednesday and Friday nights., Disp: 30 g, Rfl: 12 .  Multiple Vitamins-Minerals ([name] PO), Take 1 tablet by mouth daily. , Disp: , Rfl:  .  Omega-3 Fatty Acids (OMEGA-3 PO), Take 1 capsule by mouth 2 (two) times daily. Once to twice daily (pt sometimes forgets 2nd dose), Disp: , Rfl:  .  OXYQUINOLONE SULFATE VAGINAL (TRIMO-SAN) 0.025 % GEL, Place vaginally., Disp: , Rfl:  .  TURMERIC PO, Take 1 capsule by mouth daily. , Disp: , Rfl:   Allergies  Allergen Reactions  . Tussionex Pennkinetic Er [Hydrocod Polst-Cpm Polst Er] Other (See Comments)    Real dizzy headed even after a while still walking every direction but straight, patient stated she stopped taking it because of that.  . Hydrocodone     Light  headed and dizziness    ROS  No other specific complaints in a complete review of systems (except as listed in HPI above).  Objective  Vitals:   07/28/17 1156  BP: 122/74  Pulse: 82  Resp: 18  Temp: 98.7 F (37.1 C)  TempSrc: Oral  SpO2: 96%  Weight: 151 lb (68.5 kg)  Height: 5\' 6"  (1.676 m)    Body mass index is 24.37 kg/m.  Nursing Note and Vital Signs reviewed.  Physical Exam   Constitutional: Patient appears well-developed and well-nourished.  No distress.  Cardiovascular: Normal rate, regular rhythm, S1/S2 present.  No murmur or rub heard. Pulses intact  Pulmonary/Chest: Effort normal and breath sounds clear. No respiratory distress or retractions. MSK: has left knee sleeve, when taken off mild appreciable swelling, no tenderness, redness, mild warmth- likely due to sleeve. No calf tenderness, redness or swelling Psychiatric: Patient has a normal mood and affect. behavior is normal. Judgment and thought content normal.  No results found for this or any previous visit (from the past 72 hour(s)).  Assessment & Plan  1. Blood clot in vein - US Venous Img Lower Unilateral Left; Future  2. Acute pain of right knee - continue ortho management: NSAIDs, knee sleeve, exercise, heat and PT   -Red flags and when to present for emergency care or RTC including fever >101.32F, chest pain, shortness of breath, new/worsening/un-resolving symptoms,  reviewed with patient at time of visit. Follow up and care instructions discussed and provided in AVS.  ------------------------------------------------- Discussed with NP after [name] resulted; she also consulted with patient's primary, Dr. Ancil Boozer I have reviewed this encounter including the documentation in this note and/or discussed this patient with the provider, Suezanne Cheshire DNP AGNP-C. I am certifying that I agree with the content of this note as supervising physician. Enid Derry, [facility] 08/03/2017, 7:52 AM

## 2017-08-02 ENCOUNTER — Telehealth: Payer: Self-pay | Admitting: Family Medicine

## 2017-08-02 ENCOUNTER — Other Ambulatory Visit: Payer: Self-pay | Admitting: Nurse Practitioner

## 2017-08-02 ENCOUNTER — Ambulatory Visit
Admission: RE | Admit: 2017-08-02 | Discharge: 2017-08-02 | Disposition: A | Discharge status: Home / Self Care | Payer: Medicare Other | Source: Ambulatory Visit | Attending: Nurse Practitioner | Admitting: Nurse Practitioner

## 2017-08-02 DIAGNOSIS — I8290 Acute embolism and thrombosis of unspecified vein: Secondary | ICD-10-CM | POA: Diagnosis not present

## 2017-08-02 DIAGNOSIS — I824Z1 Acute embolism and thrombosis of unspecified deep veins of right distal lower extremity: Secondary | ICD-10-CM

## 2017-08-02 DIAGNOSIS — I824Z2 Acute embolism and thrombosis of unspecified deep veins of left distal lower extremity: Secondary | ICD-10-CM | POA: Diagnosis not present

## 2017-08-02 MED ORDER — RIVAROXABAN 20 MG PO TABS: 20.0000 mg | ORAL_TABLET | Freq: Every day | ORAL | 3 refills | Status: DC

## 2017-08-02 MED ORDER — RIVAROXABAN 15 MG PO TABS: 15.0000 mg | ORAL_TABLET | Freq: Two times a day (BID) | ORAL | 0 refills | Status: DC

## 2017-08-02 MED ORDER — RIVAROXABAN (XARELTO) EDUCATION KIT FOR DVT/PE PATIENTS: 1.0000 | PACK | Freq: Once | 0 refills | Status: AC

## 2017-08-02 NOTE — Telephone Encounter (Signed)
Please advise 

## 2017-08-02 NOTE — Telephone Encounter (Signed)
Patient was informed that she has been scheduled for imaging today at 1:45pm at the [facility]. Patient was encouraged to arrive at 1:30pm.  Patient expressed verbal understanding and said thanks.

## 2017-08-02 NOTE — Telephone Encounter (Signed)
Copied from [place]. Topic: Quick Communication - See Telephone Encounter >> Aug 02, 2017  3:42 PM Vernona Rieger wrote: CRM for notification. See Telephone encounter for: 08/02/17.  [facility] said that received two different prescriptions for Rivaroxaban (XARELTO) 15 MG TABS tablet & rivaroxaban (XARELTO) 20 MG TABS tablet. They would like to know does she start the 15mg  first? Please advise Call back is [phone]

## 2017-08-02 NOTE — Telephone Encounter (Signed)
Copied from [place] [id number]. Topic: Quick Communication - See Telephone Encounter >> Aug 02, 2017 10:27 AM Ahmed Prima L wrote: CRM for notification. See Telephone encounter for: 08/02/17.  Patient said she was in on Thursday 4/18. She was told that someone would be calling her right away to set up an ultra sound for the blood clot in her left leg. She said that she has not heard from anyone & just wants to follow up Call back is [phone]

## 2017-08-03 ENCOUNTER — Ambulatory Visit (INDEPENDENT_AMBULATORY_CARE_PROVIDER_SITE_OTHER): Payer: Medicare Other | Admitting: Family Medicine

## 2017-08-03 ENCOUNTER — Encounter: Payer: Self-pay | Admitting: Family Medicine

## 2017-08-03 VITALS — BP 130/76 | HR 77 | Resp 16 | Ht 66.0 in | Wt 153.7 lb

## 2017-08-03 DIAGNOSIS — I824Z2 Acute embolism and thrombosis of unspecified deep veins of left distal lower extremity: Secondary | ICD-10-CM | POA: Diagnosis not present

## 2017-08-03 DIAGNOSIS — M1712 Unilateral primary osteoarthritis, left knee: Secondary | ICD-10-CM

## 2017-08-03 LAB — COMPLETE METABOLIC PANEL WITH GFR
AG Ratio: 1.1 (calc) (ref 1.0–2.5)
ALKALINE PHOSPHATASE (APISO): 95 U/L (ref 33–130)
ALT: 16 U/L (ref 6–29)
AST: 22 U/L (ref 10–35)
Albumin: 4.1 g/dL (ref 3.6–5.1)
BUN: 13 mg/dL (ref 7–25)
CO2: 33 mmol/L — AB (ref 20–32)
CREATININE: 0.8 mg/dL (ref 0.60–0.93)
Calcium: 9.4 mg/dL (ref 8.6–10.4)
Chloride: 101 mmol/L (ref 98–110)
GFR, Est African American: 84 mL/min/{1.73_m2} (ref >=60)
GFR, Est Non African American: 72 mL/min/{1.73_m2} (ref >=60)
Globulin: 3.7 g/dL (calc) (ref 1.9–3.7)
Glucose, Bld: 87 mg/dL (ref 65–139)
POTASSIUM: 3.7 mmol/L (ref 3.5–5.3)
SODIUM: 138 mmol/L (ref 135–146)
Total Bilirubin: 0.3 mg/dL (ref 0.2–1.2)
Total Protein: 7.8 g/dL (ref 6.1–8.1)

## 2017-08-03 LAB — CBC WITH DIFFERENTIAL/PLATELET
Basophils Absolute: 21 cells/uL (ref 0–200)
Basophils Relative: 0.6 %
EOS ABS: 119 {cells}/uL (ref 15–500)
Eosinophils Relative: 3.4 %
HEMATOCRIT: 35.7 % (ref 35.0–45.0)
Hemoglobin: 12 g/dL (ref 11.7–15.5)
Lymphs Abs: 1344 cells/uL (ref 850–3900)
MCH: 29.3 pg (ref 27.0–33.0)
MCHC: 33.6 g/dL (ref 32.0–36.0)
MCV: 87.1 fL (ref 80.0–100.0)
MPV: 11.3 fL (ref 7.5–12.5)
Monocytes Relative: 9.9 %
NEUTROS ABS: 1670 {cells}/uL (ref 1500–7800)
Neutrophils Relative %: 47.7 %
Platelets: 265 10*3/uL (ref 140–400)
RBC: 4.1 10*6/uL (ref 3.80–5.10)
RDW: 12.4 % (ref 11.0–15.0)
Total Lymphocyte: 38.4 %
WBC: 3.5 10*3/uL — AB (ref 3.8–10.8)
WBCMIX: 347 {cells}/uL (ref 200–950)

## 2017-08-03 NOTE — Progress Notes (Signed)
Name: Meredith Wilcox   MRN: [id number]    DOB: [birth year]   Date:08/03/2017       Progress Note  Subjective  Chief Complaint  Chief Complaint  Patient presents with  . Medication Problem    She would rather take Aspirin therapy instead of the Xarelto.    HPI  DVT acute: she went to [facility] on [facility]th, 2019 because of worsening of left knee pain and acute onset of left calf pain. Dx with DVT per [name] but after discussion with [facility] provider she decided to just repeat the [name] instead of starting blood thinner. Repeat US this week unchanged. She agrees on starting medication today. Discussed options and risk and benefits of it. She chose Xarelto, she will take with food and monitor for bleeding. Return in 3 months for repeat US  OA left knee: seeing Ortho and [facility] and will start PT, dealing with left knee pain and decrease in function for over one year, likely the cause of DVT on left calf, from decrease in activity on that leg  Patient Active Problem List   Diagnosis Date Noted  . Vaginal discharge 01/27/2017  . 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 Cystocele, midline 12/21/2016  . Vaginal atrophy 12/21/2016  . 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 History of Helicobacter pylori infection 11/16/2014  . 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 Incontinence 11/16/2014  . 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 BREAST BIOPSY Left 10/11/2016   left breast stereo/ VASCULAR LESION  . BREAST BIOPSY Left 12/02/2016   Procedure: NEEDLE LOCALIZATION;  Surgeon: [name], MD;  Location: [facility] ORS;  Service: General;  Laterality: Left;  . BREAST EXCISIONAL BIOPSY Left 12/02/2016   left lumpectomy  . BREAST LUMPECTOMY Left 12/02/2016   Procedure: EXCISION BREAST MASS;  Surgeon: [name], MD;  Location: [facility] ORS;  Service: General;  Laterality: Left;  . DILATION  AND CURETTAGE OF UTERUS    . dnc     1983  . fatty tumor removal Right    hand    Family History  Problem Relation Age of Onset  . Diabetes Mother   . Hypertension Mother   . Cardiomyopathy Sister   . Down syndrome Sister   . Leukemia Brother   . Alcohol abuse Son        youngest son  . Asthma Son        youngest son  . Rheum arthritis Sister   . Drug abuse Child 23  . Breast cancer Neg Hx   . Kidney cancer Neg Hx   . Bladder Cancer Neg Hx   . Prostate cancer Neg Hx     Social History   Socioeconomic History  . Marital status: Married    Spouse name: Not on file  . Number of children: Not on file  . Years of education: Not on file  . Highest education level: Not on file  Occupational History  . Not on file  Social Needs  . Financial resource strain: Not on file  . Food insecurity:    Worry: Not on file    Inability: Not on file  . Transportation needs:    Medical: Not on file    Non-medical: Not on file  Tobacco Use  . Smoking status: Never Smoker  . Smokeless tobacco: Never Used  Substance and Sexual Activity  . Alcohol use: No  Alcohol/week: 0.0 oz  . Drug use: No  . Sexual activity: Not Currently    Birth control/protection: Post-menopausal  Lifestyle  . Physical activity:    Days per week: Not on file    Minutes per session: Not on file  . Stress: Not on file  Relationships  . Social connections:    Talks on phone: Not on file    Gets together: Not on file    Attends religious service: Not on file    Active member of club or organization: Not on file    Attends meetings of clubs or organizations: Not on file    Relationship status: Not on file  . Intimate partner violence:    Fear of current or ex partner: Not on file    Emotionally abused: Not on file    Physically abused: Not on file    Forced sexual activity: Not on file  Other Topics Concern  . Not on file  Social History Narrative  . Not on file     Current Outpatient  Medications:  .  Cholecalciferol (VITAMIN D) 2000 UNITS tablet, Take 2,000 Units by mouth daily. Reported on 06/04/2015, Disp: , Rfl:  .  conjugated estrogens (PREMARIN) vaginal cream, Place 1 Applicatorful vaginally daily. Apply 0.5mg  (pea-sized amount)  just inside the vaginal introitus with a finger-tip every night for two weeks and then Monday, Wednesday and Friday nights., Disp: 30 g, Rfl: 12 .  Multiple Vitamins-Minerals ([name] PO), Take 1 tablet by mouth daily. , Disp: , Rfl:  .  Omega-3 Fatty Acids (OMEGA-3 PO), Take 1 capsule by mouth 2 (two) times daily. Once to twice daily (pt sometimes forgets 2nd dose), Disp: , Rfl:  .  OXYQUINOLONE SULFATE VAGINAL (TRIMO-SAN) 0.025 % GEL, Place vaginally., Disp: , Rfl:  .  TURMERIC PO, Take 1 capsule by mouth daily. , Disp: , Rfl:  .  Rivaroxaban (XARELTO) 15 MG TABS tablet, Take 1 tablet (15 mg total) by mouth 2 (two) times daily with a meal., Disp: 42 tablet, Rfl: 0 .  [START ON 08/30/2017] rivaroxaban (XARELTO) 20 MG TABS tablet, Take 1 tablet (20 mg total) by mouth daily with supper., Disp: 30 tablet, Rfl: 3  Allergies  Allergen Reactions  . Tussionex Pennkinetic Er [Hydrocod Polst-Cpm Polst Er] Other (See Comments)    Real dizzy headed even after a while still walking every direction but straight, patient stated she stopped taking it because of that.  . Hydrocodone     Light headed and dizziness     ROS  Ten systems reviewed and is negative except as mentioned in HPI   Objective  Vitals:   08/03/17 1109  BP: 130/76  Pulse: 77  Resp: 16  SpO2: 96%  Weight: 153 lb 11.2 oz (69.7 kg)  Height: 5\' 6"  (1.676 m)    Body mass index is 24.81 kg/m.  Physical Exam  Constitutional: Patient appears well-developed and well-nourished. Obese  No distress.  HEENT: head atraumatic, normocephalic, pupils equal and reactive to light,neck supple, throat within normal limits Cardiovascular: Normal rate, regular rhythm and normal heart  sounds.  No murmur heard. left calf pain and mild swelling, no redness or bruising Pulmonary/Chest: Effort normal and breath sounds normal. No respiratory distress. Abdominal: Soft.  There is no tenderness. Psychiatric: Patient has a normal mood and affect. behavior is normal. Judgment and thought content normal. Muscular Skeletal: wearing a knee brace of left   PHQ2/9: Depression screen [facility] 2/9 07/28/2017 07/28/2016 06/17/2016 06/03/2016 07/10/2015  Decreased Interest 0 0 0 0 0  Down, Depressed, Hopeless 0 0 0 0 0  PHQ - 2 Score 0 0 0 0 0    Fall Risk: Fall Risk  08/03/2017 08/03/2017 07/28/2017 07/28/2016 06/17/2016  Falls in the past year? No No No No No     Functional Status Survey: Is the patient deaf or have difficulty hearing?: No Does the patient have difficulty seeing, even when wearing glasses/contacts?: No Does the patient have difficulty concentrating, remembering, or making decisions?: No Does the patient have difficulty walking or climbing stairs?: No Does the patient have difficulty dressing or bathing?: No Does the patient have difficulty doing errands alone such as visiting a doctor's office or shopping?: No    Assessment & Plan  1. Acute deep vein thrombosis (DVT) of distal vein of left lower extremity (HCC)  - CBC with Differential/Platelet - COMPLETE METABOLIC PANEL WITH GFR  2. Primary osteoarthritis of left knee  She is wearing a brace, likely the cause of DVT because not moving left leg as much and is wearing a knee brace

## 2017-08-03 NOTE — Patient Instructions (Addendum)
Start Xarelto 15 mg twice daily for 21 days, after that take the 20 mg once a day   Deep Vein Thrombosis Deep vein thrombosis (DVT) is a condition in which a blood clot forms in a deep vein, such as a lower leg, thigh, or arm vein. A clot is blood that has thickened into a gel or solid. This condition is dangerous. It can lead to serious and even life-threatening complications if the clot travels to the lungs and causes a blockage (pulmonary embolism). It can also damage veins in the leg. This can result in leg pain, swelling, discoloration, and sores (post-thrombotic syndrome). What are the causes? This condition may be caused by:  A slowdown of blood flow.  Damage to a vein.  A condition that makes blood clot more easily.  What increases the risk? The following factors may make you more likely to develop this condition:  Being overweight.  Being elderly, especially over age 61.  Sitting or lying down for more than four hours.  Lack of physical activity (sedentary lifestyle).  Being pregnant, giving birth, or having recently given birth.  Taking medicines that contain estrogen.  Smoking.  A history of any of the following: ? Blood clots or blood clotting disease. ? Peripheral vascular disease. ? Inflammatory bowel disease. ? Cancer. ? Heart disease. ? Genetic conditions that affect how blood clots. ? Neurological diseases that affect the legs (leg paresis). ? Injury. ? Major or lengthy surgery. ? A central line placed inside a large vein.  What are the signs or symptoms? Symptoms of this condition include:  Swelling, pain, or tenderness in an arm or leg.  Warmth, redness, or discoloration in an arm or leg.  If the clot is in your leg, symptoms may be more noticeable or worse when you stand or walk. Some people do not have any symptoms. How is this diagnosed? This condition is diagnosed with:  A medical history.  A physical exam.  Tests, such as: ? Blood  tests. These are done to see how your blood clots. ? Imaging tests. These are done to check for clots. Tests may include:  Ultrasound.  CT scan.  MRI.  X-ray.  Venogram. For this test, X-rays are taken after a dye is injected into a vein.  How is this treated? Treatment for this condition depends on the cause, your risk for bleeding or developing more clots, and any medical conditions you have. Treatment may include:  Taking blood thinners (also called anticoagulants). These medicines may be taken by mouth, injected under the skin, or injected through an IV tube (catheter). These medicines prevent clots from forming.  Injecting medicine that dissolves blood clots into the affected vein (catheter-directed thrombolysis).  Having surgery. Surgery may be done to: ? Remove the clot. ? Place a filter in a large vein to catch blood clots before they reach the lungs.  Some treatments may be continued for up to six months. Follow these instructions at home: If you are taking an oral blood thinner:  Take the medicine exactly as told by your health care provider. Some blood thinners need to be taken at the same time every day. Do not skip a dose.  Ask your health care provider about what foods and drugs interact with the medicine.  Ask about possible side effects. General instructions  Blood thinners can cause easy bruising and difficulty stopping bleeding. Because of this, if you are taking or were given a blood thinner: ? Hold pressure over cuts  for longer than usual. ? Tell your dentist and other health care providers that you are taking blood thinners before having any procedures that can cause bleeding. ? Avoid contact sports.  Take over-the-counter and prescription medicines only as told by your health care provider.  Return to your normal activities as told by your health care provider. Ask your health care provider what activities are safe for you.  Wear compression stockings  if recommended by your health care provider.  Keep all follow-up visits as told by your health care provider. This is important. How is this prevented? To lower your risk of developing this condition again:  For 30 or more minutes every day, do an activity that: ? Involves moving your arms and legs. ? Increases your heart rate.  When traveling for longer than four hours: ? Exercise your arms and legs every hour. ? Drink plenty of water. ? Avoid drinking alcohol.  Avoid sitting or lying for a long time without moving your legs.  Stay a healthy weight.  If you are a woman who is older than age 95, avoid unnecessary use of medicines that contain estrogen.  Do not use any products that contain nicotine or tobacco, such as cigarettes and e-cigarettes. This is especially important if you take estrogen medicines. If you need help quitting, ask your health care provider.  Contact a health care provider if:  You miss a dose of your blood thinner.  You have nausea, vomiting, or diarrhea that lasts for more than one day.  Your menstrual period is heavier than usual.  You have unusual bruising. Get help right away if:  You have new or increased pain, swelling, or redness in an arm or leg.  You have numbness or tingling in an arm or leg.  You have shortness of breath.  You have chest pain.  You have a rapid or irregular heartbeat.  You feel light-headed or dizzy.  You cough up blood.  There is blood in your vomit, stool, or urine.  You have a serious fall or accident, or you hit your head.  You have a severe headache or confusion.  You have a cut that will not stop bleeding. These symptoms may represent a serious problem that is an emergency. Do not wait to see if the symptoms will go away. Get medical help right away. Call your local emergency services (911 in the [place] Do not drive yourself to the hospital. Summary  DVT is a condition in which a blood clot forms in a  deep vein, such as a lower leg, thigh, or arm vein.  Symptoms can include swelling, warmth, pain, and redness in your leg or arm.  Treatment may include taking blood thinners, injecting medicine that dissolves blood clots,wearing compression stockings, or surgery.  If you are prescribed blood thinners, take them exactly as told. This information is not intended to replace advice given to you by your health care provider. Make sure you discuss any questions you have with your health care provider. Document Released: 03/29/2005 Document Revised: 05/01/2016 Document Reviewed: 05/01/2016 Elsevier Interactive Patient Education  2018 [employer].

## 2017-08-10 DIAGNOSIS — M25562 Pain in left knee: Secondary | ICD-10-CM | POA: Diagnosis not present

## 2017-08-10 DIAGNOSIS — M6281 Muscle weakness (generalized): Secondary | ICD-10-CM | POA: Diagnosis not present

## 2017-08-18 DIAGNOSIS — M25562 Pain in left knee: Secondary | ICD-10-CM | POA: Diagnosis not present

## 2017-08-19 DIAGNOSIS — M6281 Muscle weakness (generalized): Secondary | ICD-10-CM | POA: Diagnosis not present

## 2017-08-19 DIAGNOSIS — M25562 Pain in left knee: Secondary | ICD-10-CM | POA: Diagnosis not present

## 2017-08-22 DIAGNOSIS — M6281 Muscle weakness (generalized): Secondary | ICD-10-CM | POA: Diagnosis not present

## 2017-08-22 DIAGNOSIS — M25562 Pain in left knee: Secondary | ICD-10-CM | POA: Diagnosis not present

## 2017-08-24 ENCOUNTER — Other Ambulatory Visit: Payer: Self-pay | Admitting: Family Medicine

## 2017-08-24 ENCOUNTER — Telehealth: Payer: Self-pay | Admitting: Family Medicine

## 2017-08-24 DIAGNOSIS — I824Z1 Acute embolism and thrombosis of unspecified deep veins of right distal lower extremity: Secondary | ICD-10-CM

## 2017-08-24 MED ORDER — RIVAROXABAN 20 MG PO TABS: 20.0000 mg | ORAL_TABLET | Freq: Every day | ORAL | 2 refills | Status: DC

## 2017-08-24 NOTE — Telephone Encounter (Signed)
Copied from [place]. Topic: Quick Communication - Rx Refill/Question >> Aug 24, 2017  8:40 AM Bea Graff, NT wrote: Medication: rivaroxaban (XARELTO) 20 MG TABS tablet Has the patient contacted their pharmacy? Yes.   (Agent: If no, request that the patient contact the pharmacy for the refill.) Preferred Pharmacy (with phone number or street name): [place], [place]. [phone] (Phone) [phone] (Fax)   Pt states she will be out of medication today. She only has 2 left for today and the rx is not scheduled for refill until 08/30/17.  Agent: Please be advised that RX refills may take up to 3 business days. We ask that you follow-up with your pharmacy.

## 2017-08-25 DIAGNOSIS — M6281 Muscle weakness (generalized): Secondary | ICD-10-CM | POA: Diagnosis not present

## 2017-08-25 DIAGNOSIS — M25562 Pain in left knee: Secondary | ICD-10-CM | POA: Diagnosis not present

## 2017-09-07 DIAGNOSIS — M25562 Pain in left knee: Secondary | ICD-10-CM | POA: Diagnosis not present

## 2017-09-07 DIAGNOSIS — M6281 Muscle weakness (generalized): Secondary | ICD-10-CM | POA: Diagnosis not present

## 2017-09-15 DIAGNOSIS — M25562 Pain in left knee: Secondary | ICD-10-CM | POA: Diagnosis not present

## 2017-09-15 DIAGNOSIS — M6281 Muscle weakness (generalized): Secondary | ICD-10-CM | POA: Diagnosis not present

## 2017-11-01 ENCOUNTER — Encounter: Payer: Self-pay | Admitting: Family Medicine

## 2017-11-01 ENCOUNTER — Ambulatory Visit (INDEPENDENT_AMBULATORY_CARE_PROVIDER_SITE_OTHER): Payer: Medicare Other | Admitting: Family Medicine

## 2017-11-01 ENCOUNTER — Ambulatory Visit: Payer: Medicare Other

## 2017-11-01 VITALS — BP 132/62 | HR 71 | Temp 98.8°F | Resp 12 | Ht 66.0 in | Wt 156.1 lb

## 2017-11-01 DIAGNOSIS — D708 Other neutropenia: Secondary | ICD-10-CM | POA: Diagnosis not present

## 2017-11-01 DIAGNOSIS — Z9889 Other specified postprocedural states: Secondary | ICD-10-CM

## 2017-11-01 DIAGNOSIS — M1712 Unilateral primary osteoarthritis, left knee: Secondary | ICD-10-CM

## 2017-11-01 DIAGNOSIS — Z0000 Encounter for general adult medical examination without abnormal findings: Secondary | ICD-10-CM

## 2017-11-01 DIAGNOSIS — Z1231 Encounter for screening mammogram for malignant neoplasm of breast: Secondary | ICD-10-CM | POA: Diagnosis not present

## 2017-11-01 DIAGNOSIS — I824Z2 Acute embolism and thrombosis of unspecified deep veins of left distal lower extremity: Secondary | ICD-10-CM | POA: Diagnosis not present

## 2017-11-01 DIAGNOSIS — R928 Other abnormal and inconclusive findings on diagnostic imaging of breast: Secondary | ICD-10-CM | POA: Diagnosis not present

## 2017-11-01 LAB — CBC WITH DIFFERENTIAL/PLATELET
BASOS ABS: 29 {cells}/uL (ref 0–200)
Basophils Relative: 0.8 %
EOS ABS: 68 {cells}/uL (ref 15–500)
EOS PCT: 1.9 %
HCT: 34.1 % — ABNORMAL LOW (ref 35.0–45.0)
Hemoglobin: 11.1 g/dL — ABNORMAL LOW (ref 11.7–15.5)
Lymphs Abs: 1490 cells/uL (ref 850–3900)
MCH: 28.7 pg (ref 27.0–33.0)
MCHC: 32.6 g/dL (ref 32.0–36.0)
MCV: 88.1 fL (ref 80.0–100.0)
MONOS PCT: 9.7 %
MPV: 12.2 fL (ref 7.5–12.5)
NEUTROS ABS: 1663 {cells}/uL (ref 1500–7800)
NEUTROS PCT: 46.2 %
Platelets: 206 10*3/uL (ref 140–400)
RBC: 3.87 10*6/uL (ref 3.80–5.10)
RDW: 13.2 % (ref 11.0–15.0)
TOTAL LYMPHOCYTE: 41.4 %
WBC mixed population: 349 cells/uL (ref 200–950)
WBC: 3.6 10*3/uL — ABNORMAL LOW (ref 3.8–10.8)

## 2017-11-01 NOTE — Progress Notes (Addendum)
Subjective:   Meredith Wilcox is a 77 y.o. female who presents for Medicare Annual (Subsequent) preventive examination.  Review of Systems:  N/A Cardiac Risk Factors include: advanced age (>52men, >69 women);dyslipidemia     Objective:     Vitals: BP 132/62 (BP Location: Left Arm, Patient Position: Sitting, Cuff Size: Normal)   Pulse 71   Temp 98.8 F (37.1 C) (Oral)   Resp 12   Ht 5\' 6"  (1.676 m)   Wt 156 lb 1.6 oz (70.8 kg)   SpO2 94%   BMI 25.20 kg/m   Body mass index is 25.2 kg/m.  Advanced Directives 11/01/2017 12/02/2016 11/18/2016 07/28/2016 06/17/2016 06/03/2016 06/04/2015  Does Patient Have a Medical Advance Directive? No Yes Yes Yes Yes Yes Yes  Type of Advance Directive - [place];Living will - Living will [place];Living will - [place];Living will  Does patient want to make changes to medical advance directive? - No - Patient declined - - - - -  Copy of [place] in Chart? - No - copy requested - - - - No - copy requested  Would patient like information on creating a medical advance directive? Yes (MAU/Ambulatory/Procedural Areas - Information given) - - - - - -    Tobacco Social History   Tobacco Use  Smoking Status Never Smoker  Smokeless Tobacco Never Used  Tobacco Comment   smoking cessation materials not required     Counseling given: No Comment: smoking cessation materials not required   Clinical Intake:  Pre-visit preparation completed: Yes  Pain : No/denies pain     BMI - recorded: 25.2 Nutritional Status: BMI of 19-24  Normal Nutritional Risks: None Diabetes: No  How often do you need to have someone help you when you read instructions, pamphlets, or other written materials from your doctor or pharmacy?: 1 - Never  Interpreter Needed?: No  Information entered by :: AEversole, LPN  Past Medical History:  Diagnosis Date  . Anemia    H/O DURING PREGNANCY  .  Arthritis   . Chronic kidney disease    KIDNEY PROBLEMS AROUND AGE 9  . Geographic tongue   . GERD (gastroesophageal reflux disease)    H/O NO MEDS  . Helicobacter pylori gastrointestinal tract infection   . Hyperlipidemia   . Incontinence   . Indigestion   . Prolapse of vaginal walls   . Right sided sciatica   . Tachycardia   . Urethral prolapse    Past Surgical History:  Procedure Laterality Date  . BREAST BIOPSY Left 10/11/2016   left breast stereo/ VASCULAR LESION  . BREAST BIOPSY Left 12/02/2016   Procedure: NEEDLE LOCALIZATION;  Surgeon: [name], MD;  Location: [facility] ORS;  Service: General;  Laterality: Left;  . BREAST EXCISIONAL BIOPSY Left 12/02/2016   left lumpectomy  . BREAST LUMPECTOMY Left 12/02/2016   Procedure: EXCISION BREAST MASS;  Surgeon: [name], MD;  Location: [facility] ORS;  Service: General;  Laterality: Left;  . DILATION AND CURETTAGE OF UTERUS    . dnc     1983  . fatty tumor removal Right    hand   Family History  Problem Relation Age of Onset  . Diabetes Mother   . Hypertension Mother   . Cardiomyopathy Sister   . Down syndrome Sister   . Leukemia Brother   . Alcohol abuse Son        youngest son  . Asthma Son  youngest son  . Rheum arthritis Sister   . Drug abuse Child 23  . Breast cancer Neg Hx   . Kidney cancer Neg Hx   . Bladder Cancer Neg Hx   . Prostate cancer Neg Hx    Social History   Socioeconomic History  . Marital status: Married    Spouse name: [name]  . Number of children: 4  . Years of education: some college  . Highest education level: 12th grade  Occupational History  . Occupation: Retired  Scientific laboratory technician  . Financial resource strain: Not hard at all  . Food insecurity:    Worry: Never true    Inability: Never true  . Transportation needs:    Medical: No    Non-medical: No  Tobacco Use  . Smoking status: Never Smoker  . Smokeless tobacco: Never Used  . Tobacco comment: smoking  cessation materials not required  Substance and Sexual Activity  . Alcohol use: No    Alcohol/week: 0.0 oz  . Drug use: No  . Sexual activity: Not Currently    Birth control/protection: Post-menopausal  Lifestyle  . Physical activity:    Days per week: 7 days    Minutes per session: 30 min  . Stress: Not at all  Relationships  . Social connections:    Talks on phone: Patient refused    Gets together: Patient refused    Attends religious service: Patient refused    Active member of club or organization: Patient refused    Attends meetings of clubs or organizations: Patient refused    Relationship status: Married  Other Topics Concern  . Not on file  Social History Narrative  . Not on file    Outpatient Encounter Medications as of 11/01/2017  Medication Sig  . Cholecalciferol (VITAMIN D) 2000 UNITS tablet Take 2,000 Units by mouth daily. Reported on 06/04/2015  . conjugated estrogens (PREMARIN) vaginal cream Place 1 Applicatorful vaginally daily. Apply 0.5mg  (pea-sized amount)  just inside the vaginal introitus with a finger-tip every night for two weeks and then Monday, Wednesday and Friday nights.  . Multiple Vitamins-Minerals ([name] PO) Take 1 tablet by mouth daily.   . Omega-3 Fatty Acids (OMEGA-3 PO) Take 1 capsule by mouth 2 (two) times daily. Once to twice daily (pt sometimes forgets 2nd dose)  . OXYQUINOLONE SULFATE VAGINAL (TRIMO-SAN) 0.025 % GEL Place vaginally.  . rivaroxaban (XARELTO) 20 MG TABS tablet Take 1 tablet (20 mg total) by mouth daily with supper.  . TURMERIC PO Take 1 capsule by mouth daily.    No facility-administered encounter medications on file as of 11/01/2017.     Activities of Daily Living In your present state of health, do you have any difficulty performing the following activities: 11/01/2017 08/03/2017  Hearing? N N  Comment denies hearing aids -  Vision? N N  Comment wears eyeglasses -  Difficulty concentrating or making decisions? N  N  Walking or climbing stairs? N N  Dressing or bathing? N N  Doing errands, shopping? N N  Preparing Food and eating ? N -  Comment partial upper dentures -  Using the Toilet? N -  In the past six months, have you accidently leaked urine? N -  Do you have problems with loss of bowel control? N -  Managing your Medications? N -  Managing your Finances? N -  Housekeeping or managing your Housekeeping? N -  Some recent data might be hidden    Patient Care Team: Sowles,  Drue Stager, MD as PCP - General (Family Medicine) Birder Robson, MD as Consulting Physician (Ophthalmology)    Assessment:   This is a routine wellness examination for [facility].  Exercise Activities and Dietary recommendations Current Exercise Habits: Home exercise routine, Type of exercise: walking, Time (Minutes): 30, Frequency (Times/Week): 7, Weekly Exercise (Minutes/Week): 210, Intensity: Mild, Exercise limited by: None identified  Goals    . DIET - INCREASE WATER INTAKE     Recommend to drink at least 6-8 8oz glasses of water per day.       Fall Risk Fall Risk  11/01/2017 08/03/2017 08/03/2017 07/28/2017 07/28/2016  Falls in the past year? No No No No No  Risk for fall due to : Impaired vision - - - -  Risk for fall due to: Comment wears eyeglasses - - - -   FALL RISK PREVENTION PERTAINING TO HOME: Is your home free of loose throw rugs in walkways, pet beds, electrical cords, etc? Yes Is there adequate lighting in your home to reduce risk of falls?  Yes Are there stairs in or around your home WITH handrails? Yes  ASSISTIVE DEVICES UTILIZED TO PREVENT FALLS: Use of a cane, walker or w/c? No Grab bars in the bathroom? No  Shower chair or a place to sit while bathing? Yes An elevated toilet seat or a handicapped toilet? No  Timed Get Up and Go Performed: Yes. Pt ambulated 10 feet within 8 sec. Gait stead-fast and without the use of an assistive device. No intervention required at this time. Fall risk  prevention has been discussed.  Community Resource Referral:  Pt declined my offer to send [employer] Referral to Care Guide for installation of grab bars in the shower or an elevated toilet seat.  Depression Screen PHQ 2/9 Scores 11/01/2017 07/28/2017 07/28/2016 06/17/2016  PHQ - 2 Score 0 0 0 0  PHQ- 9 Score 0 - - -     Cognitive Function     6CIT Screen 11/01/2017  What Year? 0 points  What month? 0 points  What time? 3 points  Count back from 20 0 points  Months in reverse 0 points  Repeat phrase 4 points  Total Score 7   Asked later about time and she got it right, score of 4  There is no immunization history for the selected administration types on file for this patient.  Qualifies for Shingles Vaccine? Yes. Due for Shingrix. Education has been provided regarding the importance of this vaccine. Pt has been advised to call insurance company to determine out of pocket expense. Advised may also receive vaccine at local pharmacy or Health Dept. Verbalized acceptance and understanding.  Overdue for Flu vaccine. Education has been provided regarding the importance of this vaccine and advised to receive when available. Verbalized acceptance and understanding.  Due for Pneumoccocal vaccine. Declined my offer to administer today. Education has been provided regarding the importance of this vaccine but still declined. Advised may receive this vaccine at local pharmacy or Health Dept. Aware to provide a copy of the vaccination record if obtained from local pharmacy or Health Dept. Verbalized acceptance and understanding.  Due for Tdap vaccine. Education has been provided regarding the importance of this vaccine. Advised may receive this vaccine at local pharmacy or Health Dept. Aware to provide a copy of the vaccination record if obtained from local pharmacy or Health Dept. Verbalized acceptance and understanding.  Screening Tests Health Maintenance  Topic Date Due  . MAMMOGRAM   11/02/2018 (Originally 09/01/2017)  .  DEXA SCAN  01/25/2019 (Originally 01/17/2007)  . TETANUS/TDAP  09/29/2028 (Originally 01/16/1961)  . INFLUENZA VACCINE  03/02/2029 (Originally 11/10/2017)  . PNA vac Low Risk Adult (1 of 2 - PCV13) 03/02/2029 (Originally 01/17/2007)  . COLONOSCOPY  04/12/2020    Cancer Screenings: Lung: Low Dose CT Chest recommended if Age 68-80 years, 30 pack-year currently smoking OR have quit w/in 15years. Patient does not qualify. Breast:  Up to date on Mammogram? No. Completed 09/01/16. Pt was scheduled for repeat mammography 03/2017 but canceled appt. Declined my offer to order mammogram today. Education provided re: importance of this screening but still declined. States she would prefer to discuss further with Dr. Ancil Boozer.    Up to date of Bone Density/Dexa? No. Declined my offer to order DEXA today. Education provided re: importance of this screening but still declined. States she would prefer to discuss further with Dr. Ancil Boozer.    Colorectal: Completed 03/10/11. Repeat every 10 years  Additional Screenings: Hepatitis C Screening: Does not qualify    Plan:  I have personally reviewed and addressed the Medicare Annual Wellness questionnaire and have noted the following in the patient's chart:  A. Medical and social history B. Use of alcohol, tobacco or illicit drugs  C. Current medications and supplements D. Functional ability and status E.  Nutritional status F.  Physical activity G. Advance directives H. List of other physicians I.  Hospitalizations, surgeries, and ER visits in previous 12 months J.  [place] such as hearing and vision if needed, cognitive and depression L. Referrals and appointments  In addition, I have reviewed and discussed with patient certain preventive protocols, quality metrics, and best practice recommendations. A written personalized care plan for preventive services as well as general preventive health recommendations were  provided to patient.  See attached scanned questionnaire for additional information.   Signed,  Aleatha Borer, LPN Nurse Health Advisor  I have reviewed this encounter including the documentation in this note and/or discussed this patient with the provider, Aleatha Borer, LPN. I am certifying that I agree with the content of this note as supervising physician.  [name], MD [facility] 11/01/2017, 12:46 PM

## 2017-11-01 NOTE — Progress Notes (Signed)
Name: Meredith Wilcox   MRN: [id number]    DOB: Mar 09, 1941   Date:11/01/2017       Progress Note  Subjective  Chief Complaint  Chief Complaint  Patient presents with  . Follow-up    HPI  DVT follow up: she had DVT diagnosed 07/2017 and has been taking Xarelto for the past 3 months. She states swelling and pain resolved. No redness, no side effects of medication   OA knee: she states pain on her knees has improved, no effusion or redness. She had PT and has helped with symptoms.   Abnormal mammogram: had biopsy left breast done 11/2016 but lost to repeat mammogram, explained importance of follow up and recheck it. She denies any nipple discharge or breast lumps. She states she is worried about multiple radiation exposure.   Pelvic relaxation: she has seen Dr. [name], tried pessary but she did not like it. She continues to have urinary frequency.    Patient Active Problem List   Diagnosis Date Noted  . Acute deep vein thrombosis (DVT) of distal vein of left lower extremity ([place]) 08/03/2017  . Vaginal discharge 01/27/2017  . 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 Cystocele, midline 12/21/2016  . Vaginal atrophy 12/21/2016  . 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 History of Helicobacter pylori infection 11/16/2014  . 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 Incontinence 11/16/2014  . 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 BREAST BIOPSY Left 10/11/2016   left breast stereo/ VASCULAR LESION  . BREAST BIOPSY Left 12/02/2016   Procedure: NEEDLE LOCALIZATION;  Surgeon: [name], MD;  Location: [facility] ORS;  Service: General;  Laterality: Left;  . BREAST EXCISIONAL BIOPSY Left 12/02/2016   left lumpectomy  . BREAST LUMPECTOMY Left 12/02/2016   Procedure: EXCISION BREAST MASS;  Surgeon: [name], MD;  Location: [facility] ORS;  Service: General;   Laterality: Left;  . DILATION AND CURETTAGE OF UTERUS    . dnc     1983  . fatty tumor removal Right    hand    Family History  Problem Relation Age of Onset  . Diabetes Mother   . Hypertension Mother   . Cardiomyopathy Sister   . Down syndrome Sister   . Leukemia Brother   . Alcohol abuse Son        youngest son  . Asthma Son        youngest son  . Rheum arthritis Sister   . Drug abuse Child 23  . Breast cancer Neg Hx   . Kidney cancer Neg Hx   . Bladder Cancer Neg Hx   . Prostate cancer Neg Hx     Social History   Socioeconomic History  . Marital status: Married    Spouse name: [name]  . Number of children: 4  . Years of education: some college  . Highest education level: 12th grade  Occupational History  . Occupation: Retired  Scientific laboratory technician  . Financial resource strain: Not hard at all  . Food insecurity:    Worry: Never true    Inability: Never true  . Transportation needs:    Medical: No    Non-medical: No  Tobacco Use  . Smoking status: Never Smoker  . Smokeless tobacco: Never Used  . Tobacco comment: smoking cessation materials not required  Substance and Sexual Activity  . Alcohol use: No    Alcohol/week: 0.0 oz  .  Drug use: No  . Sexual activity: Not Currently    Birth control/protection: Post-menopausal  Lifestyle  . Physical activity:    Days per week: 7 days    Minutes per session: 30 min  . Stress: Not at all  Relationships  . Social connections:    Talks on phone: Patient refused    Gets together: Patient refused    Attends religious service: Patient refused    Active member of club or organization: Patient refused    Attends meetings of clubs or organizations: Patient refused    Relationship status: Married  . Intimate partner violence:    Fear of current or ex partner: No    Emotionally abused: No    Physically abused: No    Forced sexual activity: No  Other Topics Concern  . Not on file  Social History Narrative  . Not on  file     Current Outpatient Medications:  .  Cholecalciferol (VITAMIN D) 2000 UNITS tablet, Take 2,000 Units by mouth daily. Reported on 06/04/2015, Disp: , Rfl:  .  conjugated estrogens (PREMARIN) vaginal cream, Place 1 Applicatorful vaginally daily. Apply 0.5mg  (pea-sized amount)  just inside the vaginal introitus with a finger-tip every night for two weeks and then Monday, Wednesday and Friday nights., Disp: 30 g, Rfl: 12 .  Multiple Vitamins-Minerals ([name] PO), Take 1 tablet by mouth daily. , Disp: , Rfl:  .  Omega-3 Fatty Acids (OMEGA-3 PO), Take 1 capsule by mouth 2 (two) times daily. Once to twice daily (pt sometimes forgets 2nd dose), Disp: , Rfl:  .  OXYQUINOLONE SULFATE VAGINAL (TRIMO-SAN) 0.025 % GEL, Place vaginally., Disp: , Rfl:  .  rivaroxaban (XARELTO) 20 MG TABS tablet, Take 1 tablet (20 mg total) by mouth daily with supper., Disp: 30 tablet, Rfl: 2 .  TURMERIC PO, Take 1 capsule by mouth daily. , Disp: , Rfl:   Allergies  Allergen Reactions  . Tussionex Pennkinetic Er [Hydrocod Polst-Cpm Polst Er] Other (See Comments)    Real dizzy headed even after a while still walking every direction but straight, patient stated she stopped taking it because of that.  . Hydrocodone     Light headed and dizziness     ROS  Constitutional: Negative for fever or weight change.  Respiratory: Negative for cough and shortness of breath.   Cardiovascular: Negative for chest pain or palpitations.  Gastrointestinal: Negative for abdominal pain, no bowel changes.  Musculoskeletal: Negative for gait problem or joint swelling.  Skin: Negative for rash.  Neurological: Negative for dizziness or headache.  No other specific complaints in a complete review of systems (except as listed in HPI above).  Objective  Vitals:   11/01/17 1012  BP: 132/62  Pulse: 71  Resp: 12  Temp: 98.8 F (37.1 C)  TempSrc: Oral  SpO2: 94%  Weight: 156 lb 1.6 oz (70.8 kg)  Height: 5\' 6"  (1.676 m)     Body mass index is 25.2 kg/m.  Physical Exam  Constitutional: Patient appears well-developed and well-nourished. No distress.  HEENT: head atraumatic, normocephalic, pupils equal and reactive to light, neck supple, throat within normal limits Cardiovascular: Normal rate, regular rhythm and normal heart sounds.  No murmur heard. No BLE edema. Pulmonary/Chest: Effort normal and breath sounds normal. No respiratory distress. Abdominal: Soft.  There is no tenderness. Psychiatric: Patient has a normal mood and affect. behavior is normal. Judgment and thought content normal. Muscular Skeletal: no pain during compression of calves  Recent Results (from  the past 2160 hour(s))  CBC with Differential/Platelet     Status: Abnormal   Collection Time: 08/03/17 12:27 PM  Result Value Ref Range   WBC 3.5 (L) 3.8 - 10.8 Thousand/uL   RBC 4.10 3.80 - 5.10 Million/uL   Hemoglobin 12.0 11.7 - 15.5 g/dL   HCT 35.7 35.0 - 45.0 %   MCV 87.1 80.0 - 100.0 fL   MCH 29.3 27.0 - 33.0 pg   MCHC 33.6 32.0 - 36.0 g/dL   RDW 12.4 11.0 - 15.0 %   Platelets 265 140 - 400 Thousand/uL   MPV 11.3 7.5 - 12.5 fL   Neutro Abs 1,670 1,500 - 7,800 cells/uL   Lymphs Abs 1,344 850 - 3,900 cells/uL   WBC mixed population 347 200 - 950 cells/uL   Eosinophils Absolute 119 15 - 500 cells/uL   Basophils Absolute 21 0 - 200 cells/uL   Neutrophils Relative % 47.7 %   Total Lymphocyte 38.4 %   Monocytes Relative 9.9 %   Eosinophils Relative 3.4 %   Basophils Relative 0.6 %  COMPLETE METABOLIC PANEL WITH GFR     Status: Abnormal   Collection Time: 08/03/17 12:27 PM  Result Value Ref Range   Glucose, Bld 87 65 - 139 mg/dL    Comment: .        Non-fasting reference interval .    BUN 13 7 - 25 mg/dL   Creat 0.80 0.60 - 0.93 mg/dL    Comment: For patients >88 years of age, the reference limit for Creatinine is approximately 13% higher for people identified as African-American. .    GFR, Est Non African American 72  > OR = 60 mL/min/1.85m2   GFR, Est African American 84 > OR = 60 mL/min/1.58m2   BUN/Creatinine Ratio NOT APPLICABLE 6 - 22 (calc)   Sodium 138 135 - 146 mmol/L   Potassium 3.7 3.5 - 5.3 mmol/L   Chloride 101 98 - 110 mmol/L   CO2 33 (H) 20 - 32 mmol/L   Calcium 9.4 8.6 - 10.4 mg/dL   Total Protein 7.8 6.1 - 8.1 g/dL   Albumin 4.1 3.6 - 5.1 g/dL   Globulin 3.7 1.9 - 3.7 g/dL (calc)   AG Ratio 1.1 1.0 - 2.5 (calc)   Total Bilirubin 0.3 0.2 - 1.2 mg/dL   Alkaline phosphatase (APISO) 95 33 - 130 U/L   AST 22 10 - 35 U/L   ALT 16 6 - 29 U/L      PHQ2/9: Depression screen [facility] 2/9 11/01/2017 07/28/2017 07/28/2016 06/17/2016 06/03/2016  Decreased Interest 0 0 0 0 0  Down, Depressed, Hopeless 0 0 0 0 0  PHQ - 2 Score 0 0 0 0 0  Altered sleeping 0 - - - -  Tired, decreased energy 0 - - - -  Change in appetite 0 - - - -  Feeling bad or failure about yourself  0 - - - -  Trouble concentrating 0 - - - -  Moving slowly or fidgety/restless 0 - - - -  Suicidal thoughts 0 - - - -  PHQ-9 Score 0 - - - -  Difficult doing work/chores Not difficult at all - - - -     Fall Risk: Fall Risk  11/01/2017 08/03/2017 08/03/2017 07/28/2017 07/28/2016  Falls in the past year? No No No No No  Risk for fall due to : Impaired vision - - - -  Risk for fall due to: Comment wears eyeglasses - - - -  Assessment & Plan  1. Acute deep vein thrombosis (DVT) of distal vein of left lower extremity (HCC)  - US Venous Img Lower Unilateral Left; Future  2. Primary osteoarthritis of left knee  stable  3. History of left breast biopsy   - US BREAST LTD UNI LEFT INC AXILLA; Future - MM Digital Diagnostic Bilat; Future  4. Encounter for screening mammogram for breast cancer  - US BREAST LTD UNI LEFT INC AXILLA; Future - MM Digital Diagnostic Bilat; Future  5. Other neutropenia (HCC)  - CBC with Differential/Platelet  6. Abnormal finding on radiological examination of breast  - US BREAST LTD UNI LEFT  INC AXILLA; Future - MM Digital Diagnostic Bilat; Future

## 2017-11-01 NOTE — Patient Instructions (Signed)
Meredith Wilcox , Thank you for taking time to come for your Medicare Wellness Visit. I appreciate your ongoing commitment to your health goals. Please review the following plan we discussed and let me know if I can assist you in the future.   Screening recommendations/referrals: Colorectal Screening: Up to date Mammogram: Declined Bone Density: Declined  Vision and Dental Exams: Recommended annual ophthalmology exams for early detection of glaucoma and other disorders of the eye Recommended annual dental exams for proper oral hygiene  Vaccinations: Influenza vaccine: Overdue Pneumococcal vaccine: Declined Tdap vaccine: Declined. Please call your insurance company to determine your out of pocket expense. You may also receive this vaccine at your local pharmacy or Health Dept. Shingles vaccine: Please call your insurance company to determine your out of pocket expense for the Shingrix vaccine. You may also receive this vaccine at your local pharmacy or Health Dept.  Advanced directives: Advance directive discussed with you today. I have provided a copy for you to complete at home and have notarized. Once this is complete please bring a copy in to our office so we can scan it into your chart.  Goals: Recommend to drink at least 6-8 8oz glasses of water per day.  Next appointment: Please schedule your Annual Wellness Visit with your Nurse Health Advisor in one year.  Preventive Care 35 Years and Older, Female Preventive care refers to lifestyle choices and visits with your health care provider that can promote health and wellness. What does preventive care include?  A yearly physical exam. This is also called an annual well check.  Dental exams once or twice a year.  Routine eye exams. Ask your health care provider how often you should have your eyes checked.  Personal lifestyle choices, including:  Daily care of your teeth and gums.  Regular physical activity.  Eating a healthy  diet.  Avoiding tobacco and drug use.  Limiting alcohol use.  Practicing safe sex.  Taking low-dose aspirin every day.  Taking vitamin and mineral supplements as recommended by your health care provider. What happens during an annual well check? The services and screenings done by your health care provider during your annual well check will depend on your age, overall health, lifestyle risk factors, and family history of disease. Counseling  Your health care provider may ask you questions about your:  Alcohol use.  Tobacco use.  Drug use.  Emotional well-being.  Home and relationship well-being.  Sexual activity.  Eating habits.  History of falls.  Memory and ability to understand (cognition).  Work and work Statistician.  Reproductive health. Screening  You may have the following tests or measurements:  Height, weight, and BMI.  Blood pressure.  Lipid and cholesterol levels. These may be checked every 5 years, or more frequently if you are over 66 years old.  Skin check.  Lung cancer screening. You may have this screening every year starting at age 27 if you have a 30-pack-year history of smoking and currently smoke or have quit within the past 15 years.  Fecal occult blood test (FOBT) of the stool. You may have this test every year starting at age 41.  Flexible sigmoidoscopy or colonoscopy. You may have a sigmoidoscopy every 5 years or a colonoscopy every 10 years starting at age 41.  Hepatitis C blood test.  Hepatitis B blood test.  Sexually transmitted disease (STD) testing.  Diabetes screening. This is done by checking your blood sugar (glucose) after you have not eaten for a while (fasting).  You may have this done every 1-3 years.  Bone density scan. This is done to screen for osteoporosis. You may have this done starting at age 104.  Mammogram. This may be done every 1-2 years. Talk to your health care provider about how often you should have  regular mammograms. Talk with your health care provider about your test results, treatment options, and if necessary, the need for more tests. Vaccines  Your health care provider may recommend certain vaccines, such as:  Influenza vaccine. This is recommended every year.  Tetanus, diphtheria, and acellular pertussis (Tdap, Td) vaccine. You may need a Td booster every 10 years.  Zoster vaccine. You may need this after age 85.  Pneumococcal 13-valent conjugate (PCV13) vaccine. One dose is recommended after age 41.  Pneumococcal polysaccharide (PPSV23) vaccine. One dose is recommended after age 76. Talk to your health care provider about which screenings and vaccines you need and how often you need them. This information is not intended to replace advice given to you by your health care provider. Make sure you discuss any questions you have with your health care provider. Document Released: 04/25/2015 Document Revised: 12/17/2015 Document Reviewed: 01/28/2015 Elsevier Interactive Patient Education  2017 [place] Prevention in the Home Falls can cause injuries. They can happen to people of all ages. There are many things you can do to make your home safe and to help prevent falls. What can I do on the outside of my home?  Regularly fix the edges of walkways and driveways and fix any cracks.  Remove anything that might make you trip as you walk through a door, such as a raised step or threshold.  Trim any bushes or trees on the path to your home.  Use bright outdoor lighting.  Clear any walking paths of anything that might make someone trip, such as rocks or tools.  Regularly check to see if handrails are loose or broken. Make sure that both sides of any steps have handrails.  Any raised decks and porches should have guardrails on the edges.  Have any leaves, snow, or ice cleared regularly.  Use sand or salt on walking paths during winter.  Clean up any spills in your  garage right away. This includes oil or grease spills. What can I do in the bathroom?  Use night lights.  Install grab bars by the toilet and in the tub and shower. Do not use towel bars as grab bars.  Use non-skid mats or decals in the tub or shower.  If you need to sit down in the shower, use a plastic, non-slip stool.  Keep the floor dry. Clean up any water that spills on the floor as soon as it happens.  Remove soap buildup in the tub or shower regularly.  Attach bath mats securely with double-sided non-slip rug tape.  Do not have throw rugs and other things on the floor that can make you trip. What can I do in the bedroom?  Use night lights.  Make sure that you have a light by your bed that is easy to reach.  Do not use any sheets or blankets that are too big for your bed. They should not hang down onto the floor.  Have a firm chair that has side arms. You can use this for support while you get dressed.  Do not have throw rugs and other things on the floor that can make you trip. What can I do in the kitchen?  Clean up any spills right away.  Avoid walking on wet floors.  Keep items that you use a lot in easy-to-reach places.  If you need to reach something above you, use a strong step stool that has a grab bar.  Keep electrical cords out of the way.  Do not use floor polish or wax that makes floors slippery. If you must use wax, use non-skid floor wax.  Do not have throw rugs and other things on the floor that can make you trip. What can I do with my stairs?  Do not leave any items on the stairs.  Make sure that there are handrails on both sides of the stairs and use them. Fix handrails that are broken or loose. Make sure that handrails are as long as the stairways.  Check any carpeting to make sure that it is firmly attached to the stairs. Fix any carpet that is loose or worn.  Avoid having throw rugs at the top or bottom of the stairs. If you do have throw  rugs, attach them to the floor with carpet tape.  Make sure that you have a light switch at the top of the stairs and the bottom of the stairs. If you do not have them, ask someone to add them for you. What else can I do to help prevent falls?  Wear shoes that:  Do not have high heels.  Have rubber bottoms.  Are comfortable and fit you well.  Are closed at the toe. Do not wear sandals.  If you use a stepladder:  Make sure that it is fully opened. Do not climb a closed stepladder.  Make sure that both sides of the stepladder are locked into place.  Ask someone to hold it for you, if possible.  Clearly mark and make sure that you can see:  Any grab bars or handrails.  First and last steps.  Where the edge of each step is.  Use tools that help you move around (mobility aids) if they are needed. These include:  Canes.  Walkers.  Scooters.  Crutches.  Turn on the lights when you go into a dark area. Replace any light bulbs as soon as they burn out.  Set up your furniture so you have a clear path. Avoid moving your furniture around.  If any of your floors are uneven, fix them.  If there are any pets around you, be aware of where they are.  Review your medicines with your doctor. Some medicines can make you feel dizzy. This can increase your chance of falling. Ask your doctor what other things that you can do to help prevent falls. This information is not intended to replace advice given to you by your health care provider. Make sure you discuss any questions you have with your health care provider. Document Released: 01/23/2009 Document Revised: 09/04/2015 Document Reviewed: 05/03/2014 Elsevier Interactive Patient Education  2017 [employer].

## 2017-11-07 ENCOUNTER — Ambulatory Visit: Payer: Self-pay | Admitting: *Deleted

## 2017-11-07 NOTE — Telephone Encounter (Signed)
Patient has been on Xarelto for 4 months and the itching is not getting better. She reports the bumps are becoming lesions. She feels she needs a lesser dose or take it every other day. Patient to continue the medication until she gets instruction from her provider.  Reason for Disposition . Caller has URGENT medication question about med that PCP prescribed and triager unable to answer question  Answer Assessment - Initial Assessment Questions 1. SYMPTOMS: "Do you have any symptoms?"     Itchy skin, lesions 2. SEVERITY: If symptoms are present, ask "Are they mild, moderate or severe?"     Skin lesion- left foot, right leg, right shoulder  Protocols used: MEDICATION QUESTION CALL-A-AH

## 2017-11-07 NOTE — Telephone Encounter (Signed)
She should have a repeat doppler and see if DVT resolved, please verify, it was supposed to have been done this week

## 2017-11-08 ENCOUNTER — Other Ambulatory Visit: Payer: Self-pay

## 2017-11-08 DIAGNOSIS — I824Z2 Acute embolism and thrombosis of unspecified deep veins of left distal lower extremity: Secondary | ICD-10-CM

## 2017-11-08 NOTE — Telephone Encounter (Signed)
I tried to contact this patient to inform her that she has been scheduled to have her DVT tomorrow Wednesday, November 09, 2017 at 4pm at [facility], but there was no answer. A message was left for her with that information on it and asking her to arrive 15 mins early at the [place] entrance.

## 2017-11-08 NOTE — Addendum Note (Signed)
Addended by: Saunders Glance A on: 11/08/2017 09:58 AM   Modules accepted: Orders

## 2017-11-09 ENCOUNTER — Ambulatory Visit
Admission: RE | Admit: 2017-11-09 | Discharge: 2017-11-09 | Disposition: A | Discharge status: Home / Self Care | Payer: Medicare Other | Source: Ambulatory Visit | Attending: Family Medicine | Admitting: Family Medicine

## 2017-11-09 DIAGNOSIS — I82402 Acute embolism and thrombosis of unspecified deep veins of left lower extremity: Secondary | ICD-10-CM | POA: Diagnosis not present

## 2017-11-09 DIAGNOSIS — I824Z2 Acute embolism and thrombosis of unspecified deep veins of left distal lower extremity: Secondary | ICD-10-CM | POA: Diagnosis not present

## 2017-11-11 ENCOUNTER — Telehealth: Payer: Self-pay

## 2017-11-11 DIAGNOSIS — Z9889 Other specified postprocedural states: Secondary | ICD-10-CM

## 2017-11-11 DIAGNOSIS — R928 Other abnormal and inconclusive findings on diagnostic imaging of breast: Secondary | ICD-10-CM

## 2017-11-11 NOTE — Telephone Encounter (Signed)
Copied from [place] [id number]. Topic: Referral - Request >> Nov 10, 2017  4:46 PM Nils Flack wrote: Reason for CRM: [facility] needs to have Diagnostic mammo changed to 3d diagnostic mammo ( [facility] [phone])  They are also asking for U/S of the right breast.  Thank you    Please review and sign

## 2017-11-30 ENCOUNTER — Other Ambulatory Visit: Payer: Medicare Other

## 2018-02-03 ENCOUNTER — Ambulatory Visit: Payer: Medicare Other | Admitting: Urology

## 2018-02-14 ENCOUNTER — Ambulatory Visit: Payer: Medicare Other | Admitting: Urology

## 2018-02-28 ENCOUNTER — Encounter: Payer: Self-pay | Admitting: Urology

## 2018-02-28 ENCOUNTER — Telehealth: Payer: Self-pay | Admitting: Urology

## 2018-02-28 ENCOUNTER — Ambulatory Visit (INDEPENDENT_AMBULATORY_CARE_PROVIDER_SITE_OTHER): Payer: Medicare Other | Admitting: Urology

## 2018-02-28 VITALS — BP 156/69 | HR 84 | Ht 66.0 in | Wt 159.1 lb

## 2018-02-28 DIAGNOSIS — N8111 Cystocele, midline: Secondary | ICD-10-CM

## 2018-02-28 DIAGNOSIS — N952 Postmenopausal atrophic vaginitis: Secondary | ICD-10-CM

## 2018-02-28 DIAGNOSIS — N3941 Urge incontinence: Secondary | ICD-10-CM | POA: Diagnosis not present

## 2018-02-28 LAB — BLADDER SCAN AMB NON-IMAGING: Scan Result: 37

## 2018-02-28 MED ORDER — ESTROGENS, CONJUGATED 0.625 MG/GM VA CREA: 1.0000 | TOPICAL_CREAM | Freq: Every day | VAGINAL | 12 refills | Status: DC

## 2018-02-28 NOTE — Progress Notes (Signed)
02/28/2018 2:45 PM   Meredith Wilcox 01/18/1941 [id number]  Referring provider: [name], MD [address] [place], [place] [address]  Chief Complaint  Patient presents with  . Follow-up  . urge incontinence   HPI Patient is a 77 y.o. African American female with a history of urge incontinence, cystocele and vaginal atrophy.   She presents today for OAB questionnaire, PVR and exam as part of her annual follow up. She reports associated nocturia and polydipsia at night. She denies urgency during the day, symptoms with laughing, coughing and sneezing, pain, history of UTIs and any other associated symptoms. Pt goes to restroom as soon as she feels it which allows her to avoid accidents. Increased urination with increased water intake. She drinks more water at night because of increased thirst at that time. Drinks 8 oz glass right before bed but doesn't drink water throughout the night. No problems returning to sleep with nocturia. Her PVR is 37 mL.   She reports having removed pessary and no longer uses it. She did not like it due to excessive discharge and uncomfortability. When she checks she can still see her fallen bladder. After initial diagnosis, she stopped taking baths and sticks to showers.   Pt has had 5 pregnancies and 4 births.  Background history Patient is a 77 y.o. African American female who was referred to [name] by, Dr. [name], for bladder prolapse.    At her visit on 10/01/2016, she stated that she could feel her bladder between her legs when she walks and has experienced this for several years. She was not experiencing stress incontinence. She reported associated urinary frequency x 10-12, urgency and nocturia x 2. She denied associated dysuria,  intermittency, hesitancy, straining to urinate and weak urinary stream. She denied history of urinary tract infections, STI's or injury to the bladder.  She denied dysuria, gross hematuria, suprapubic pain,  back pain, abdominal pain or flank pain.  She reported no recent fevers, chills, nausea or vomiting.   She denied a history of nephrolithiasis, GU surgery or GU trauma.  She is post menopausal. She denied constipation and/or diarrhea, and hadn't had any recent imaging studies.  She was drinking three 20 ounce bottles of water daily. She was not drinking caffeinated beverages daily.  She was not drinking  alcoholic beverages daily.   Her PVR was 24 mL.  At her visit on 10/29/2016, she was started on Myrbetriq 25 mg and vaginal estrogen cream.  The patient has been experiencing urgency x 8 or more (unchanged), frequency x 8 or more (unchanged), is restricting fluids to avoid visits to the restroom, is engaging in toilet mapping, incontinence x 0-3 (unchanged) and nocturia x 0-3 (unchanged).   She denies dysuria, gross hematuria and suprapubic.  She denies fevers, chills, nausea and vomiting.  Her PVR is 39 mL.   She did not take the Myrbetriq samples, but she is using the vaginal estrogen cream. She recently had a breast biopsy and it was benign.  She has been evaluated by Dr. [name] and a pessary was inserted.  She has found this extremely helpful in her urinary symptoms.  At her visit on 02/06/2017, she was experiencing urgency x 0-3 (improved), frequency x 4-7 (improved), not restricting fluids to avoid visits to the restroom, is engaging in toilet mapping, incontinence x 0-3 (stable) and nocturia x 0-3 (stable).   Her PVR is 26 mL.  Her main complaint today is nocturia.  She was not  experiencing fevers, chills, nausea or vomiting.  She has not had dysuria, gross hematuria or suprapubic pain.       PMH: Past Medical History:  Diagnosis Date  . Anemia    H/O DURING PREGNANCY  . Arthritis   . Chronic kidney disease    KIDNEY PROBLEMS AROUND AGE 27  . Geographic tongue   . GERD (gastroesophageal reflux disease)    H/O NO MEDS  . Helicobacter pylori gastrointestinal tract infection   .  Hyperlipidemia   . Incontinence   . Indigestion   . Prolapse of vaginal walls   . Right sided sciatica   . Tachycardia   . Urethral prolapse     Surgical History: Past Surgical History:  Procedure Laterality Date  . BREAST BIOPSY Left 10/11/2016   left breast stereo/ VASCULAR LESION  . BREAST BIOPSY Left 12/02/2016   Procedure: NEEDLE LOCALIZATION;  Surgeon: [name], MD;  Location: [facility] ORS;  Service: General;  Laterality: Left;  . BREAST EXCISIONAL BIOPSY Left 12/02/2016   left lumpectomy  . BREAST LUMPECTOMY Left 12/02/2016   Procedure: EXCISION BREAST MASS;  Surgeon: [name], MD;  Location: [facility] ORS;  Service: General;  Laterality: Left;  . DILATION AND CURETTAGE OF UTERUS    . dnc     1983  . fatty tumor removal Right    hand    Home Medications:  Allergies as of 02/28/2018      Reactions   Tussionex Pennkinetic Er [hydrocod Polst-cpm Polst Er] Other (See Comments)   Real dizzy headed even after a while still walking every direction but straight, patient stated she stopped taking it because of that.   Hydrocodone    Light headed and dizziness      Medication List        Accurate as of 02/28/18  2:45 PM. Always use your most recent med list.          [name] PO Take 1 tablet by mouth daily.   conjugated estrogens vaginal cream Commonly known as:  PREMARIN Place 1 Applicatorful vaginally daily. Apply 0.5mg  (pea-sized amount)  just inside the vaginal introitus with a finger-tip every night for two weeks and then Monday, Wednesday and Friday nights.   OMEGA-3 PO Take 1 capsule by mouth 2 (two) times daily. Once to twice daily (pt sometimes forgets 2nd dose)   rivaroxaban 20 MG Tabs tablet Commonly known as:  XARELTO Take 1 tablet (20 mg total) by mouth daily with supper.   TRIMO-SAN 0.025 % Gel Generic drug:  OXYQUINOLONE SULFATE VAGINAL Place vaginally.   TURMERIC PO Take 1 capsule by mouth daily.   Vitamin D 50  MCG (2000 UT) tablet Take 2,000 Units by mouth daily. Reported on 06/04/2015       Allergies:  Allergies  Allergen Reactions  . Tussionex Pennkinetic Er [Hydrocod Polst-Cpm Polst Er] Other (See Comments)    Real dizzy headed even after a while still walking every direction but straight, patient stated she stopped taking it because of that.  . Hydrocodone     Light headed and dizziness    Family History: Family History  Problem Relation Age of Onset  . Diabetes Mother   . Hypertension Mother   . Cardiomyopathy Sister   . Down syndrome Sister   . Leukemia Brother   . Alcohol abuse Son        youngest son  . Asthma Son        youngest son  .  Rheum arthritis Sister   . Drug abuse Child 23  . Breast cancer Neg Hx   . Kidney cancer Neg Hx   . Bladder Cancer Neg Hx   . Prostate cancer Neg Hx     Social History:  reports that she has never smoked. She has never used smokeless tobacco. She reports that she does not drink alcohol or use drugs.  ROS: UROLOGY Frequent Urination?: No Hard to postpone urination?: No Burning/pain with urination?: No Get up at night to urinate?: Yes Leakage of urine?: No Urine stream starts and stops?: No Trouble starting stream?: No Do you have to strain to urinate?: No Blood in urine?: No Urinary tract infection?: No Sexually transmitted disease?: No Injury to kidneys or bladder?: No Painful intercourse?: No Weak stream?: No Currently pregnant?: No Vaginal bleeding?: No Last menstrual period?: n  Gastrointestinal Vomiting?: No Indigestion/heartburn?: No Diarrhea?: No Constipation?: No  Constitutional Fever: No Night sweats?: No Weight loss?: No Fatigue?: No  Skin Skin rash/lesions?: No Itching?: No  Eyes Blurred vision?: No Double vision?: No  Ears/Nose/Throat Sore throat?: No Sinus problems?: No  Hematologic/Lymphatic Swollen glands?: No Easy bruising?: No  Cardiovascular Leg swelling?: No Chest pain?:  No  Respiratory Cough?: No Shortness of breath?: No  Endocrine Excessive thirst?: Yes  Musculoskeletal Back pain?: No Joint pain?: No  Neurological Headaches?: No Dizziness?: No  Psychologic Depression?: No Anxiety?: No  Physical Exam: BP (!) 156/69 (BP Location: Left Arm, Patient Position: Sitting, Cuff Size: Large)   Pulse 84   Ht 5\' 6"  (1.676 m)   Wt 159 lb 1.6 oz (72.2 kg)   BMI 25.68 kg/m   Constitutional: Well nourished. Alert and oriented, No acute distress. HEENT: [place] AT, moist mucus membranes. Trachea midline, no masses. Cardiovascular: No clubbing, cyanosis, or edema. Respiratory: Normal respiratory effort, no increased work of breathing. GI: Abdomen is soft, non tender, non distended, no abdominal masses. Liver and spleen not palpable.  No hernias appreciated.  Stool sample for occult testing is not indicated.   GU: No CVA tenderness.  No bladder fullness or masses. Atrophic external genitalia, sparce pubic hair distribution, no lesions.  Normal urethral meatus, no lesions, no prolapse, no discharge. Urethral caruncle. No bladder fullness, tenderness or masses. Normal vagina mucosa, good estrogen effect, no discharge, no lesions, poor pelvic support, Grade 2 cystocele and no rectocele noted.  No cervical motion tenderness.  Uterus is freely mobile and non-fixed.  No adnexal/parametria masses or tenderness noted.  Anus and perineum are without rashes or lesions.  Skin: No rashes, bruises or suspicious lesions. Lymph: No cervical or inguinal adenopathy. Neurologic: Grossly intact, no focal deficits, moving all 4 extremities. Psychiatric: Normal mood and affect.  Pertinent Imaging: Results for ARALYN, NOWAK (MRN [id number]) as of 02/28/18 13:20  Ref. Range 02/04/2017 10:00 02/28/18  Scan Result Unknown 26 37    Assessment & Plan:  1. Urge incontinence Not bothersome RTC in 12 months for PVR and OAB questionnaire  2. Cystocele No longer using  pessary Discussed options of surgery and physical therapy with patient as treatment options and addressed patients questions and concerns regarding both.  Patient is willing to discuss surgical options and will follow up with Dr. Matilde Sprang   3. Vaginal atrophy Continue Premarin cream three nights weekly (MWF), although she has forgotten it recently still feels it is helpful. Refill sent to pharmacy. She will follow up in 12 months for an exam   Return for appointment with Dr. Matilde Sprang.  These notes generated  with voice recognition software. I apologize for typographical errors.  Laneta Simmers  [place] [address] [place], [place] [address] [phone]  I, Temidayo Atanda-Ogunleye , am acting as a Education administrator for [employer], PA-C.   I have reviewed the above documentation for accuracy and completeness, and I agree with the above.    Zara Council, PA-C

## 2018-02-28 NOTE — Telephone Encounter (Signed)
Attempted to reach pt on both numbers listed no answer on either, left vm on home number to call back.  Pt's premarin cream rx printed and just needed to verify again which pharmacy the patient stated she wanted this sent to

## 2018-03-01 NOTE — Telephone Encounter (Signed)
Faxed rx to [facility] on file. Attempted to reach pt again, no answer, left vm to call back

## 2018-04-10 ENCOUNTER — Ambulatory Visit: Payer: Medicare Other | Admitting: Urology

## 2018-04-24 ENCOUNTER — Encounter: Payer: Self-pay | Admitting: Urology

## 2018-04-24 ENCOUNTER — Ambulatory Visit (INDEPENDENT_AMBULATORY_CARE_PROVIDER_SITE_OTHER): Payer: Medicare Other | Admitting: Urology

## 2018-04-24 VITALS — BP 150/83 | HR 81 | Ht 65.0 in | Wt 145.0 lb

## 2018-04-24 DIAGNOSIS — N8111 Cystocele, midline: Secondary | ICD-10-CM

## 2018-04-24 NOTE — Progress Notes (Signed)
04/24/2018 9:50 AM   Meredith Wilcox 05-Feb-1941 [id number]  Referring provider: [name], MD [address] [place], [place] [address]  Chief Complaint  Patient presents with  . Urinary Urgency    follow up    HPI: Meredith Wilcox: She has urge incontinence and used to wear a pessary for cystocele.  Myrbetriq and estrogen cream prescribed in 2018.  Today The patient feels vaginal bulging but she said it does not bother her much and she does not reduce it.  She has not had a hysterectomy.  She reports that she is continent and does not report taking Myrbetriq.  She is using estrogen 3 times a week  She voids every 1 or 2 hours depending on fluid intake and gets up 2 or more times a night. Flow is good  She has no neurologic issues.  Bowel movements normal.  No bladder infections or previous surgery  Modifying factors: There are no other modifying factors  Associated signs and symptoms: There are no other associated signs and symptoms Aggravating and relieving factors: There are no other aggravating or relieving factors Severity: Moderate Duration: Persistent      PMH: Past Medical History:  Diagnosis Date  . Anemia    H/O DURING PREGNANCY  . Arthritis   . Chronic kidney disease    KIDNEY PROBLEMS AROUND AGE 77  . Geographic tongue   . GERD (gastroesophageal reflux disease)    H/O NO MEDS  . Helicobacter pylori gastrointestinal tract infection   . Hyperlipidemia   . Incontinence   . Indigestion   . Prolapse of vaginal walls   . Right sided sciatica   . Tachycardia   . Urethral prolapse     Surgical History: Past Surgical History:  Procedure Laterality Date  . BREAST BIOPSY Left 10/11/2016   left breast stereo/ VASCULAR LESION  . BREAST BIOPSY Left 12/02/2016   Procedure: NEEDLE LOCALIZATION;  Surgeon: [name], MD;  Location: [facility] ORS;  Service: General;  Laterality: Left;  . BREAST EXCISIONAL BIOPSY Left 12/02/2016   left lumpectomy    . BREAST LUMPECTOMY Left 12/02/2016   Procedure: EXCISION BREAST MASS;  Surgeon: [name], MD;  Location: [facility] ORS;  Service: General;  Laterality: Left;  . DILATION AND CURETTAGE OF UTERUS    . dnc     1983  . fatty tumor removal Right    hand    Home Medications:  Allergies as of 04/24/2018      Reactions   Tussionex Pennkinetic Er [hydrocod Polst-cpm Polst Er] Other (See Comments)   Real dizzy headed even after a while still walking every direction but straight, patient stated she stopped taking it because of that.   Hydrocodone    Light headed and dizziness      Medication List       Accurate as of April 24, 2018  9:50 AM. Always use your most recent med list.        [name] PO Take 1 tablet by mouth daily.   conjugated estrogens vaginal cream Commonly known as:  PREMARIN Place 1 Applicatorful vaginally daily. Apply 0.5mg  (pea-sized amount)  just inside the vaginal introitus with a finger-tip every night for two weeks and then Monday, Wednesday and Friday nights.   OMEGA-3 PO Take 1 capsule by mouth 2 (two) times daily. Once to twice daily (pt sometimes forgets 2nd dose)   rivaroxaban 20 MG Tabs tablet Commonly known as:  XARELTO Take 1 tablet (20 mg  total) by mouth daily with supper.   TRIMO-SAN 0.025 % Gel Generic drug:  OXYQUINOLONE SULFATE VAGINAL Place vaginally.   TURMERIC PO Take 1 capsule by mouth daily.   Vitamin D 50 MCG (2000 UT) tablet Take 2,000 Units by mouth daily. Reported on 06/04/2015       Allergies:  Allergies  Allergen Reactions  . Tussionex Pennkinetic Er [Hydrocod Polst-Cpm Polst Er] Other (See Comments)    Real dizzy headed even after a while still walking every direction but straight, patient stated she stopped taking it because of that.  . Hydrocodone     Light headed and dizziness    Family History: Family History  Problem Relation Age of Onset  . Diabetes Mother   . Hypertension Mother   .  Cardiomyopathy Sister   . Down syndrome Sister   . Leukemia Brother   . Alcohol abuse Son        youngest son  . Asthma Son        youngest son  . Rheum arthritis Sister   . Drug abuse Child 23  . Breast cancer Neg Hx   . Kidney cancer Neg Hx   . Bladder Cancer Neg Hx   . Prostate cancer Neg Hx     Social History:  reports that she has never smoked. She has never used smokeless tobacco. She reports that she does not drink alcohol or use drugs.  ROS: UROLOGY Frequent Urination?: No Hard to postpone urination?: No Burning/pain with urination?: No Get up at night to urinate?: Yes Leakage of urine?: No Urine stream starts and stops?: No Trouble starting stream?: No Do you have to strain to urinate?: No Blood in urine?: No Urinary tract infection?: No Sexually transmitted disease?: No Injury to kidneys or bladder?: No Painful intercourse?: No Weak stream?: No Currently pregnant?: No Vaginal bleeding?: No Last menstrual period?: n  Gastrointestinal Nausea?: No Vomiting?: No Indigestion/heartburn?: No Diarrhea?: No Constipation?: No  Constitutional Fever: No Night sweats?: No Weight loss?: No Fatigue?: No  Skin Skin rash/lesions?: No Itching?: No  Eyes Blurred vision?: No Double vision?: No  Ears/Nose/Throat Sore throat?: No Sinus problems?: No  Hematologic/Lymphatic Swollen glands?: No Easy bruising?: No  Cardiovascular Leg swelling?: No Chest pain?: No  Respiratory Cough?: No Shortness of breath?: No  Endocrine Excessive thirst?: Yes  Musculoskeletal Back pain?: No Joint pain?: No  Neurological Headaches?: No Dizziness?: No  Psychologic Depression?: No Anxiety?: No  Physical Exam: BP (!) 150/83 (BP Location: Left Arm, Patient Position: Sitting)   Pulse 81   Ht 5\' 5"  (1.651 m)   Wt 65.8 kg   BMI 24.13 kg/m   Constitutional:  Alert and oriented, No acute distress. HEENT: [place] AT, moist mucus membranes.  Trachea midline, no  masses. Cardiovascular: No clubbing, cyanosis, or edema. Respiratory: Normal respiratory effort, no increased work of breathing. GI: Abdomen is soft, nontender, nondistended, no abdominal masses GU: No CVA tenderness.  On pelvic examination the patient has urethral mucosal prolapse.  She had a small grade 3 or 6 large grade 2 cystocele that just reach beyond the urethrovesical angle.  Her exam was a little bit limited but the uterus I believe descended from 8 or 9 cm to approximately 5 cm and she had a grade 1 rectocele and no stress incontinence with moderate atrophy Skin: No rashes, bruises or suspicious lesions. Lymph: No cervical or inguinal adenopathy. Neurologic: Grossly intact, no focal deficits, moving all 4 extremities. Psychiatric: Normal mood and affect.  Laboratory Data:  Lab Results  Component Value Date   WBC 3.6 (L) 11/01/2017   HGB 11.1 (L) 11/01/2017   HCT 34.1 (L) 11/01/2017   MCV 88.1 11/01/2017   PLT 206 11/01/2017    Lab Results  Component Value Date   CREATININE 0.80 08/03/2017    No results found for: PSA  No results found for: TESTOSTERONE  No results found for: HGBA1C  Urinalysis No results found for: COLORURINE, APPEARANCEUR, LABSPEC, [place], GLUCOSEU, HGBUR, BILIRUBINUR, KETONESUR, PROTEINUR, UROBILINOGEN, NITRITE, LEUKOCYTESUR  Pertinent Imaging:   Assessment & Plan: Patient has mildly symptomatic prolapse that is not bothering her a lot.  Estrogen cream is for the prolapse.  Picture was drawn.  Role of urodynamics discussed.  If the patient ever had surgery she would likely best benefit from a transvaginal hysterectomy with cystocele repair and vault prolapse.  Natural history discussed.  Patient does not wish to have surgery and will follow-up with Meredith Wilcox in 1 year  There are no diagnoses linked to this encounter.  No follow-ups on file.  Reece Packer, MD  [facility] [address], [place],  [place] [address] [phone]

## 2018-05-03 ENCOUNTER — Ambulatory Visit: Payer: Medicare Other | Admitting: Family Medicine

## 2018-06-07 ENCOUNTER — Ambulatory Visit: Payer: Medicare Other | Admitting: Family Medicine

## 2018-06-13 ENCOUNTER — Ambulatory Visit (INDEPENDENT_AMBULATORY_CARE_PROVIDER_SITE_OTHER): Payer: Medicare Other | Admitting: Family Medicine

## 2018-06-13 ENCOUNTER — Encounter: Payer: Self-pay | Admitting: Family Medicine

## 2018-06-13 VITALS — BP 130/60 | HR 83 | Temp 97.8°F | Resp 16 | Ht 65.0 in | Wt 160.1 lb

## 2018-06-13 DIAGNOSIS — D708 Other neutropenia: Secondary | ICD-10-CM

## 2018-06-13 DIAGNOSIS — Z780 Asymptomatic menopausal state: Secondary | ICD-10-CM | POA: Diagnosis not present

## 2018-06-13 DIAGNOSIS — R609 Edema, unspecified: Secondary | ICD-10-CM

## 2018-06-13 DIAGNOSIS — Z13820 Encounter for screening for osteoporosis: Secondary | ICD-10-CM | POA: Diagnosis not present

## 2018-06-13 DIAGNOSIS — Z86718 Personal history of other venous thrombosis and embolism: Secondary | ICD-10-CM | POA: Diagnosis not present

## 2018-06-13 DIAGNOSIS — N8189 Other female genital prolapse: Secondary | ICD-10-CM | POA: Diagnosis not present

## 2018-06-13 DIAGNOSIS — D649 Anemia, unspecified: Secondary | ICD-10-CM | POA: Diagnosis not present

## 2018-06-13 DIAGNOSIS — Z23 Encounter for immunization: Secondary | ICD-10-CM

## 2018-06-13 LAB — CBC WITH DIFFERENTIAL/PLATELET
Absolute Monocytes: 376 cells/uL (ref 200–950)
BASOS PCT: 0.6 %
Basophils Absolute: 20 cells/uL (ref 0–200)
Eosinophils Absolute: 30 cells/uL (ref 15–500)
Eosinophils Relative: 0.9 %
HCT: 35.5 % (ref 35.0–45.0)
Hemoglobin: 12 g/dL (ref 11.7–15.5)
Lymphs Abs: 1353 cells/uL (ref 850–3900)
MCH: 29.6 pg (ref 27.0–33.0)
MCHC: 33.8 g/dL (ref 32.0–36.0)
MCV: 87.4 fL (ref 80.0–100.0)
MONOS PCT: 11.4 %
MPV: 12.2 fL (ref 7.5–12.5)
Neutro Abs: 1521 cells/uL (ref 1500–7800)
Neutrophils Relative %: 46.1 %
Platelets: 196 10*3/uL (ref 140–400)
RBC: 4.06 10*6/uL (ref 3.80–5.10)
RDW: 13.1 % (ref 11.0–15.0)
Total Lymphocyte: 41 %
WBC: 3.3 10*3/uL — AB (ref 3.8–10.8)

## 2018-06-13 LAB — IRON,TIBC AND FERRITIN PANEL
%SAT: 21 % (ref 16–45)
FERRITIN: 45 ng/mL (ref 16–288)
IRON: 67 ug/dL (ref 45–160)
TIBC: 319 mcg/dL (calc) (ref 250–450)

## 2018-06-13 NOTE — Patient Instructions (Signed)
Parotitis  Parotitis is inflammation of one or both of your parotid glands. These glands produce saliva. They are found on each side of your face, below and in front of your earlobes. The saliva that they produce comes out of tiny openings (ducts) inside your cheeks. Parotitis may cause sudden swelling and pain (acute parotitis). It can also cause repeated episodes of swelling and pain or continued swelling that may or may not be painful (chronic parotitis). What are the causes? This condition may be caused by:  Infections from bacteria.  Infections from viruses, such as mumps or HIV.  Blockage (obstruction) of saliva flow through the parotid glands. This can be from a stone, scar tissue, or a tumor.  Diseases that cause your body's defense system (immune system) to attack healthy cells in your salivary glands. These are called autoimmune diseases. What increases the risk? You are more likely to develop this condition if:  You are 50 years old or older.  You do not drink enough fluids (are dehydrated).  You drink too much alcohol.  You have: ? A dry mouth. ? Poor dental hygiene. ? Diabetes. ? Gout. ? A long-term illness.  You have had radiation treatments to the head and neck.  You take certain medicines. What are the signs or symptoms? Symptoms of this condition depend on the cause. Symptoms may include:  Swelling under and in front of the ear. This may get worse after eating.  Redness of the skin over the parotid gland.  Pain and tenderness over the parotid gland. This may get worse after eating.  Fever or chills.  Pus coming from the ducts inside the mouth.  Dry mouth.  A bad taste in the mouth. How is this diagnosed? This condition may be diagnosed based on:  Your medical history.  A physical exam.  Tests to find the cause of the parotitis. These may include: ? Doing blood tests to check for an autoimmune disease or infections from a virus. ? Taking a  fluid sample from the parotid gland and testing it for infection. ? Injecting the ducts of the parotid gland with a dye and then taking X-rays (sialogram). ? Having other imaging tests of the gland, such as X-rays, ultrasound, MRI, or CT scan. ? Checking the opening of the gland for a stone or obstruction. ? Placing a needle into the gland to remove tissue for a biopsy (fine needle aspiration). How is this treated? Treatment for this condition depends on the cause. Treatment may include:  Antibiotic medicine for a bacterial infection.  Drinking more fluids.  Removing a stone or obstruction.  Treating an underlying disease that is causing parotitis.  Surgery to drain an infection, remove a growth, or remove the whole gland (parotidectomy). Treatment may not be needed if parotid swelling goes away with home care. Follow these instructions at home: Medicines   Take over-the-counter and prescription medicines only as told by your health care provider.  If you were prescribed an antibiotic medicine, take it as told by your health care provider. Do not stop taking the antibiotic even if you start to feel better. Managing pain and swelling  If directed, apply heat to the affected area as often as told by your health care provider. Use the heat source that your health care provider recommends, such as a moist heat pack or a heating pad. To apply the heat: ? Place a towel between your skin and the heat source. ? Leave the heat on for 20-30 minutes. ?   heat to the affected area as often as told by your health care provider. Use the heat source that your health care provider recommends, such as a moist heat pack or a heating pad. To apply the heat:  ? Place a towel between your skin and the heat source.  ? Leave the heat on for 20-30 minutes.  ? Remove the heat if your skin turns bright red. This is especially important if you are unable to feel pain, heat, or cold. You may have a greater risk of getting burned.   Gargle with a salt-water mixture 3-4 times a day or as needed. To make a salt-water mixture, completely dissolve -1 tsp (3-6 g) of salt in 1 cup (237 mL) of warm water.   Gently massage the parotid glands as told by your health care provider.  General instructions     Drink  enough fluid to keep your urine pale yellow.   Keep your mouth clean and moist.   Try sucking on sour candy. This may help to make your mouth less dry by stimulating the flow of saliva.   Maintain good oral health.  ? Brush your teeth at least two times a day.  ? Floss your teeth every day.  ? See your dentist regularly.   Do not use any products that contain nicotine or tobacco, such as cigarettes, e-cigarettes, and chewing tobacco. If you need help quitting, ask your health care provider.   Do not drink alcohol.   Keep all follow-up visits as told by your health care provider. This is important.  Contact a health care provider if:   You have a fever or chills.   You have new symptoms.   Your symptoms get worse.   Your symptoms do not improve with treatment.  Get help right away if:   You have difficulty breathing or swallowing because of the swollen gland.  Summary   Parotitis is inflammation of one or both of your parotid glands.   Symptoms include pain and swelling under and in front of the ear. They may also include a fever and a bad taste in your mouth.   This condition may be treated with antibiotics, increasing fluids, or surgery.   In some cases, parotitis may go away on its own without treatment.   You should drink plenty of fluids, maintain good oral hygiene, and avoid tobacco products.  This information is not intended to replace advice given to you by your health care provider. Make sure you discuss any questions you have with your health care provider.  Document Released: 09/18/2001 Document Revised: 10/25/2017 Document Reviewed: 10/25/2017  Elsevier Interactive Patient Education  2019 [employer]vier Inc[employer]

## 2018-06-13 NOTE — Progress Notes (Signed)
Name: Meredith Wilcox   MRN: [id number]    DOB: 09/20/1940   Date:06/13/2018       Progress Note  Subjective  Chief Complaint  Chief Complaint  Patient presents with  . Follow-up    HPI   Leucopenia: she has a long history of leucopenia, last labs also showed mild anemia, we will recheck today, and add iron studies. No fatigue or pica.   Swollen right parotid area: it happened yesterday, it felt like right ear was full, discomfort when biting, eating, no redness or increase in warmth. No fever or chills. Symptoms resolved since.   Cystocele : does not like pessary, seen by Urologist 04/2018, she is using estrogen cream three times a week, Dr. Matilde Sprang discussed hysterectomy and cystocele repair. She is not sure if she wants to do it yet  History of DVT: Spring of 2019, resolved on repeat doppler US 10/2017. No problems symptoms.   Patient Active Problem List   Diagnosis Date Noted  . Vaginal discharge 01/27/2017  . 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 Cystocele, midline 12/21/2016  . Vaginal atrophy 12/21/2016  . 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 History of Helicobacter pylori infection 11/16/2014  . 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 Incontinence 11/16/2014  . 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 BREAST BIOPSY Left 10/11/2016   left breast stereo/ VASCULAR LESION  . BREAST BIOPSY Left 12/02/2016   Procedure: NEEDLE LOCALIZATION;  Surgeon: [name], MD;  Location: [facility] ORS;  Service: General;  Laterality: Left;  . BREAST EXCISIONAL BIOPSY Left 12/02/2016   left lumpectomy  . BREAST LUMPECTOMY Left 12/02/2016   Procedure: EXCISION BREAST MASS;  Surgeon: [name], MD;  Location: [facility] ORS;  Service: General;  Laterality: Left;  . DILATION AND CURETTAGE OF UTERUS    . dnc     1983  . fatty tumor removal Right    hand     Family History  Problem Relation Age of Onset  . Diabetes Mother   . Hypertension Mother   . Cardiomyopathy Sister   . Down syndrome Sister   . Leukemia Brother   . Alcohol abuse Son        youngest son  . Asthma Son        youngest son  . Rheum arthritis Sister   . Drug abuse Child 23  . Breast cancer Neg Hx   . Kidney cancer Neg Hx   . Bladder Cancer Neg Hx   . Prostate cancer Neg Hx     Social History   Socioeconomic History  . Marital status: Married    Spouse name: [name]  . Number of children: 4  . Years of education: some college  . Highest education level: 12th grade  Occupational History  . Occupation: Retired  Scientific laboratory technician  . Financial resource strain: Not hard at all  . Food insecurity:    Worry: Never true    Inability: Never true  . Transportation needs:    Medical: No    Non-medical: No  Tobacco Use  . Smoking status: Never Smoker  . Smokeless tobacco: Never Used  . Tobacco comment: smoking cessation materials not required  Substance and Sexual Activity  . Alcohol use: No    Alcohol/week: 0.0 standard drinks  . Drug use: No  . Sexual activity: Not Currently    Birth control/protection: Post-menopausal  Lifestyle  .  Physical activity:    Days per week: 7 days    Minutes per session: 30 min  . Stress: Not at all  Relationships  . Social connections:    Talks on phone: Patient refused    Gets together: Patient refused    Attends religious service: Patient refused    Active member of club or organization: Patient refused    Attends meetings of clubs or organizations: Patient refused    Relationship status: Married  . Intimate partner violence:    Fear of current or ex partner: No    Emotionally abused: No    Physically abused: No    Forced sexual activity: No  Other Topics Concern  . Not on file  Social History Narrative  . Not on file     Current Outpatient Medications:  .  Cholecalciferol (VITAMIN D) 2000 UNITS tablet, Take  2,000 Units by mouth daily. Reported on 06/04/2015, Disp: , Rfl:  .  conjugated estrogens (PREMARIN) vaginal cream, Place 1 Applicatorful vaginally daily. Apply 0.5mg  (pea-sized amount)  just inside the vaginal introitus with a finger-tip every night for two weeks and then Monday, Wednesday and Friday nights., Disp: 30 g, Rfl: 12 .  Multiple Vitamins-Minerals ([name] PO), Take 1 tablet by mouth daily. , Disp: , Rfl:  .  Omega-3 Fatty Acids (OMEGA-3 PO), Take 1 capsule by mouth 2 (two) times daily. Once to twice daily (pt sometimes forgets 2nd dose), Disp: , Rfl:  .  rivaroxaban (XARELTO) 20 MG TABS tablet, Take 1 tablet (20 mg total) by mouth daily with supper., Disp: 30 tablet, Rfl: 2 .  TURMERIC PO, Take 1 capsule by mouth daily. , Disp: , Rfl:  .  OXYQUINOLONE SULFATE VAGINAL (TRIMO-SAN) 0.025 % GEL, Place vaginally., Disp: , Rfl:   Allergies  Allergen Reactions  . Tussionex Pennkinetic Er [Hydrocod Polst-Cpm Polst Er] Other (See Comments)    Real dizzy headed even after a while still walking every direction but straight, patient stated she stopped taking it because of that.  . Hydrocodone     Light headed and dizziness    I personally reviewed active problem list, medication list, allergies, family history, social history with the patient/caregiver today.   ROS  Constitutional: Negative for fever or weight change.  Respiratory: Negative for cough and shortness of breath.   Cardiovascular: Negative for chest pain or palpitations.  Gastrointestinal: Negative for abdominal pain, no bowel changes.  Musculoskeletal: Negative for gait problem or joint swelling.  Skin: Negative for rash.  Neurological: Negative for dizziness or headache.  No other specific complaints in a complete review of systems (except as listed in HPI above).   Objective  Vitals:   06/13/18 1043  BP: 130/60  Pulse: 83  Resp: 16  Temp: 97.8 F (36.6 C)  TempSrc: Oral  SpO2: 94%  Weight: 160 lb 1.6  oz (72.6 kg)  Height: 5\' 5"  (1.651 m)    Body mass index is 26.64 kg/m.  Physical Exam  Constitutional: Patient appears well-developed and well-nourished. Overweight. No distress.  HEENT: head atraumatic, normocephalic, pupils equal and reactive to light, ears normal TM, she has tenderness during palpation of right parotid, no pain when opening jaw, pain goes down under mandible angle, no significant swelling, redness or increase in warmth , neck supple, throat within normal limits Cardiovascular: Normal rate, regular rhythm and normal heart sounds.  No murmur heard. No BLE edema. Pulmonary/Chest: Effort normal and breath sounds normal. No respiratory distress. Abdominal: Soft.  There is no tenderness. Psychiatric: Patient has a normal mood and affect. behavior is normal. Judgment and thought content normal.  PHQ2/9: Depression screen [facility] 2/9 06/13/2018 11/01/2017 07/28/2017 07/28/2016 06/17/2016  Decreased Interest 0 0 0 0 0  Down, Depressed, Hopeless 0 0 0 0 0  PHQ - 2 Score 0 0 0 0 0  Altered sleeping 0 0 - - -  Tired, decreased energy 0 0 - - -  Change in appetite 0 0 - - -  Feeling bad or failure about yourself  0 0 - - -  Trouble concentrating 0 0 - - -  Moving slowly or fidgety/restless 0 0 - - -  Suicidal thoughts 0 0 - - -  PHQ-9 Score 0 0 - - -  Difficult doing work/chores - Not difficult at all - - -     Fall Risk: Fall Risk  06/13/2018 11/01/2017 08/03/2017 08/03/2017 07/28/2017  Falls in the past year? 0 No No No No  Number falls in past yr: 0 - - - -  Injury with Fall? 0 - - - -  Risk for fall due to : - Impaired vision - - -  Risk for fall due to: Comment - wears eyeglasses - - -  Follow up Falls evaluation completed - - - -     Functional Status Survey: Is the patient deaf or have difficulty hearing?: No Does the patient have difficulty seeing, even when wearing glasses/contacts?: Yes Does the patient have difficulty concentrating, remembering, or making decisions?:  No Does the patient have difficulty walking or climbing stairs?: No Does the patient have difficulty dressing or bathing?: No Does the patient have difficulty doing errands alone such as visiting a doctor's office or shopping?: No   Assessment & Plan  1. Other neutropenia ([place])  - CBC with Differential/Platelet  2. Flu vaccine need  refused  3. History of DVT (deep vein thrombosis)  Resolved   4. Need for vaccination with 13-polyvalent pneumococcal conjugate vaccine  Refused   5. Encounter for osteoporosis screening in asymptomatic postmenopausal patient  - DG Bone Density  6. Parotid swelling  Discussed lemon drops, and increase water  Possible parotiditis, she wants to hold off on antibiotics   7. Anemia, unspecified type  - CBC with Differential/Platelet - Iron, TIBC and Ferritin Panel   8. Pelvic floor relaxation  Seeing Urologist

## 2018-06-14 ENCOUNTER — Telehealth: Payer: Self-pay | Admitting: Family Medicine

## 2018-06-14 NOTE — Telephone Encounter (Signed)
Copied from [place] [id number]. Topic: Quick Communication - See Telephone Encounter >> Jun 14, 2018 10:19 AM Bea Graff, NT wrote: CRM for notification. See Telephone encounter for: 06/14/18. Pt calling for lab results that were done yesterday.

## 2018-06-14 NOTE — Telephone Encounter (Signed)
Patient called.  Patient aware.  

## 2018-09-07 ENCOUNTER — Ambulatory Visit: Payer: Self-pay | Admitting: Family Medicine

## 2018-09-07 NOTE — Chronic Care Management (AMB) (Signed)
Chronic Care Management   Note  09/07/2018 Name: Meredith Wilcox MRN: [id number] DOB: 05/25/1940  Meredith Wilcox is a 78 y.o. year old female who is a primary care patient of [name], MD. I reached out to Meredith Wilcox by phone today in response to a referral sent by Meredith Wilcox's health plan.    Meredith Wilcox was given information about Chronic Care Management services today including:  1. CCM service includes personalized support from designated clinical staff supervised by her physician, including individualized plan of care and coordination with other care providers 2. 24/7 contact phone numbers for assistance for urgent and routine care needs. 3. Service will only be billed when office clinical staff spend 20 minutes or more in a month to coordinate care. 4. Only one practitioner may furnish and bill the service in a calendar month. 5. The patient may stop CCM services at any time (effective at the end of the month) by phone call to the office staff. 6. The patient will be responsible for cost sharing (co-pay) of up to 20% of the service fee (after annual deductible is met).  Patient did not agree to services and does not wish to consider at this time.  Follow up plan: The patient has been provided with contact information for the chronic care management team and has been advised to call with any health related questions or concerns.   [place]  [email or web address]   ??[phone]

## 2018-10-02 IMAGING — CR DG KNEE COMPLETE 4+V*L*
4 series · 4 of 4 positions shown · non-contrast
Comparison: None.

CLINICAL DATA: Left knee pain

EXAM:
LEFT KNEE - COMPLETE 4+ VIEW

[knee ap]
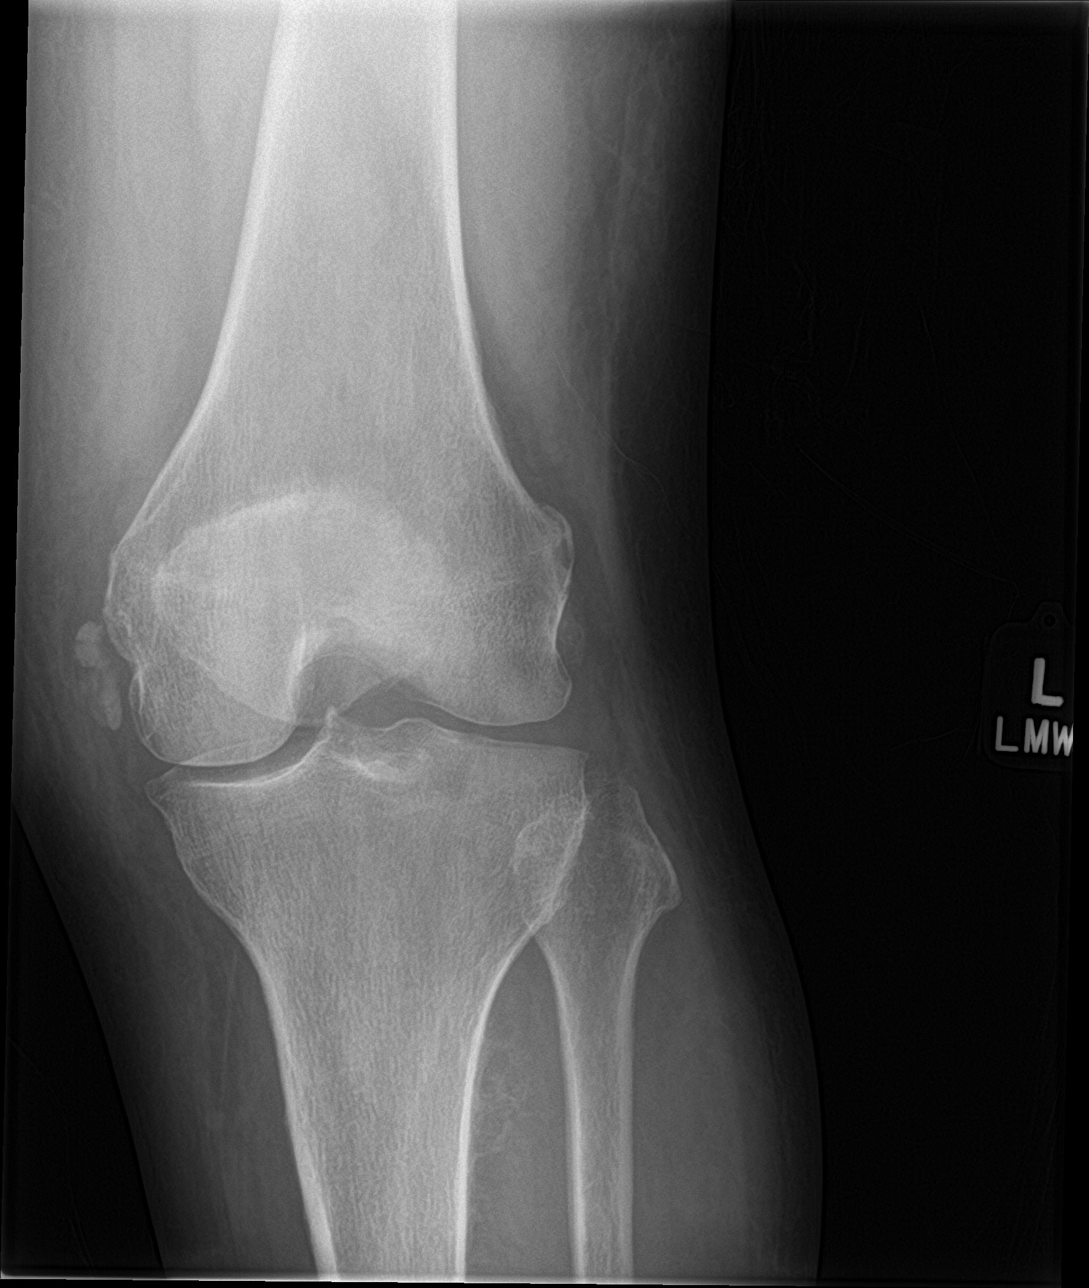

[knee obl (1 of 2)]
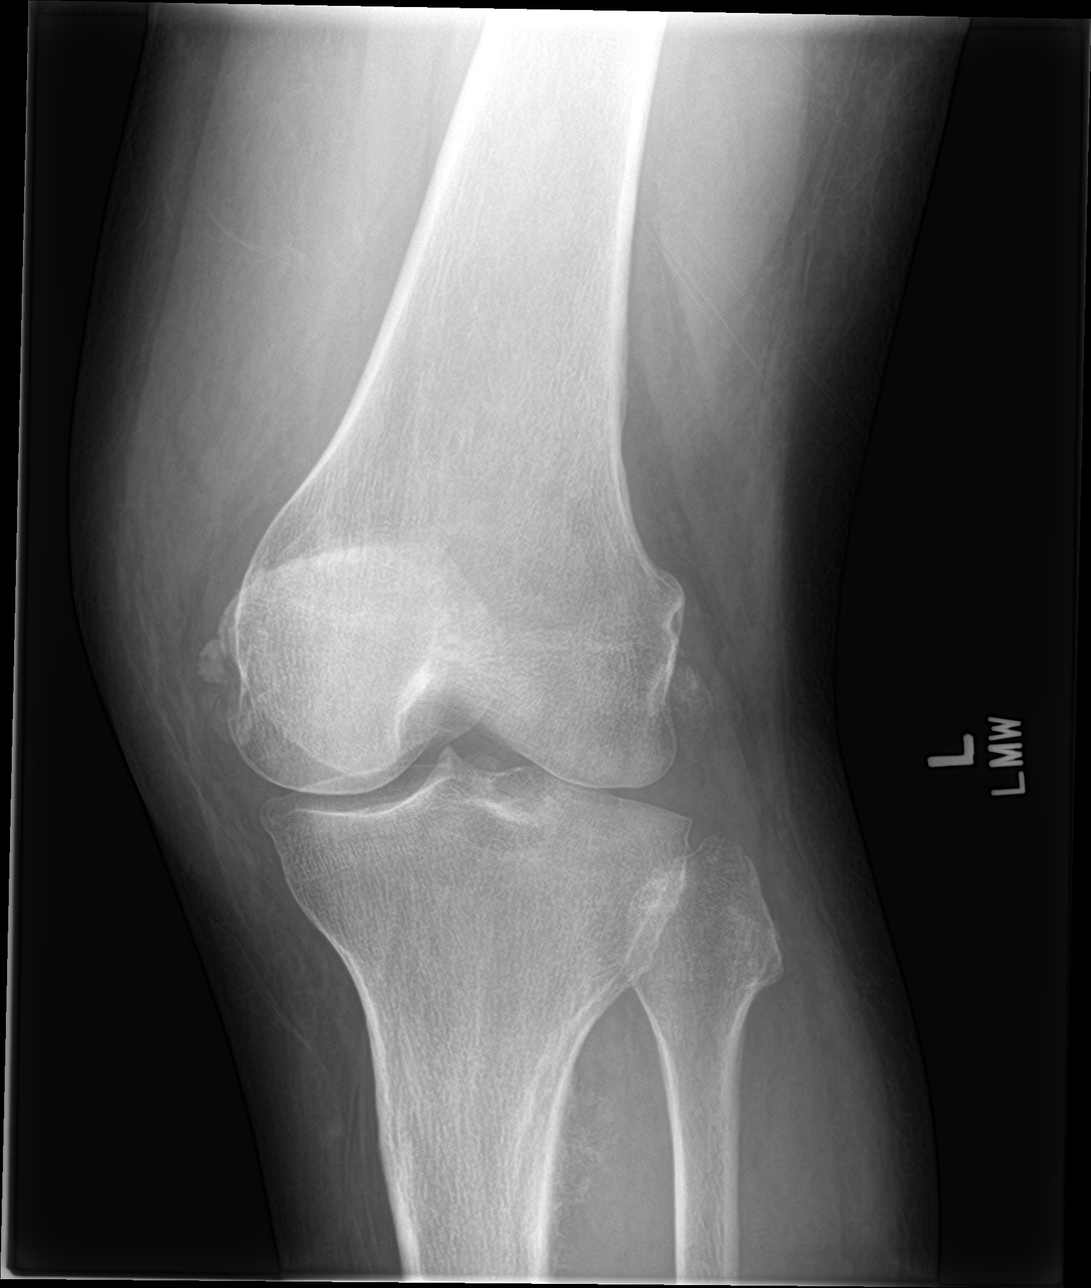

[knee obl (2 of 2)]
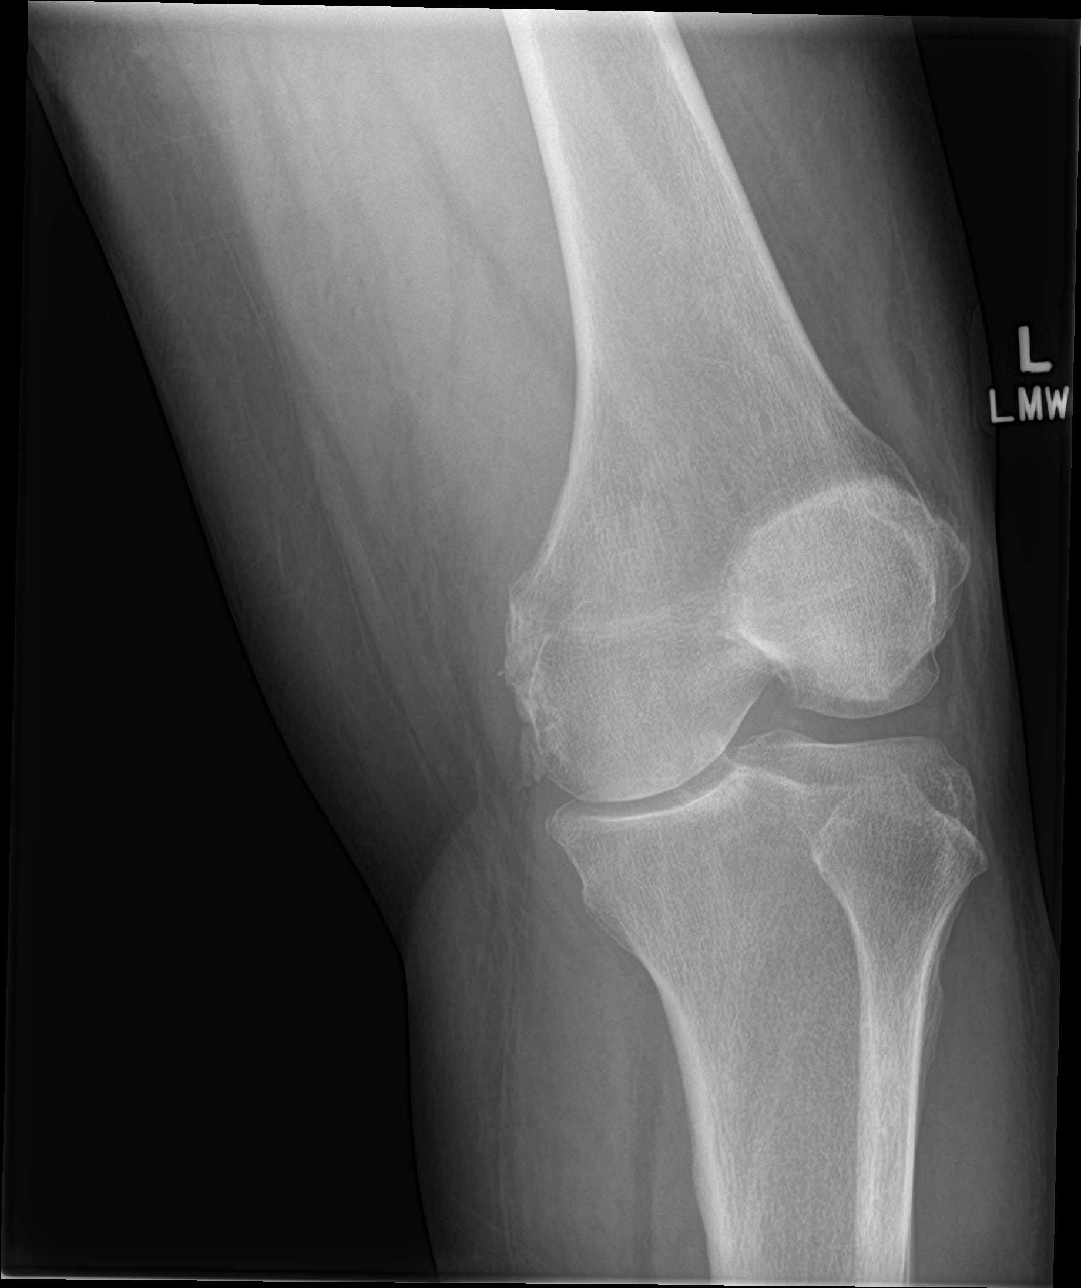

[knee lat]
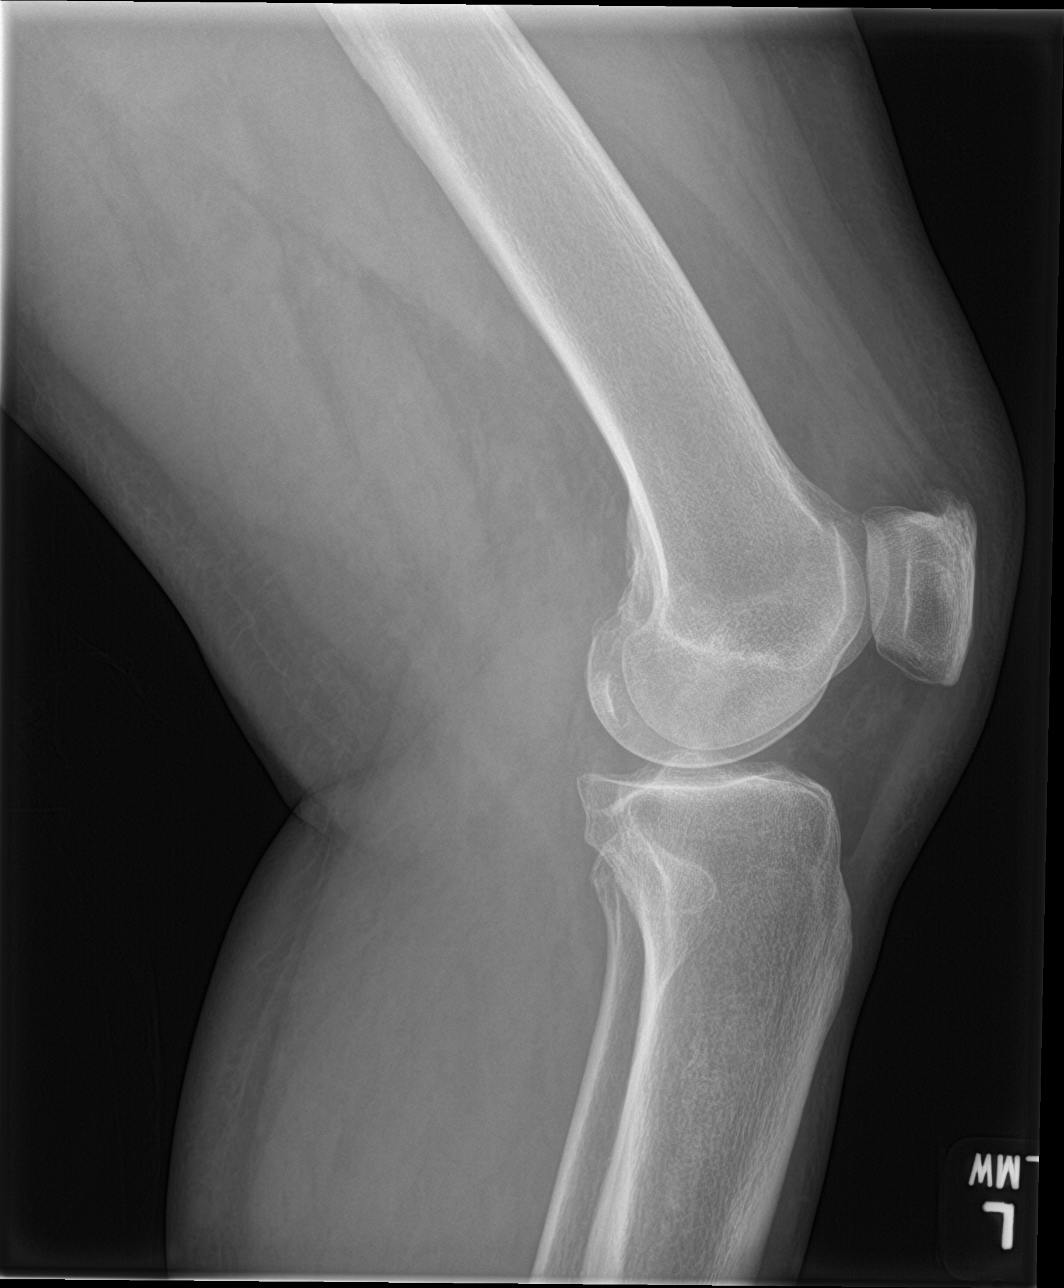

[4 of 4 positions shown; findings below may reference images not displayed]

FINDINGS: No acute displaced fracture or malalignment is seen. No large knee
effusion. Mild degenerative changes of the medial compartment.
Chunky calcifications adjacent to the medial and lateral femoral
condyle, possibly due to prior ligamentous injury. Probable
interosseous membrane calcification proximal leg.
IMPRESSION: 1. No definite acute osseous abnormality.
2. Coarse calcifications along the medial and lateral aspect of the
distal femur possibly dystrophic calcifications or related to prior
ligamentous injury.
3. Mild degenerative changes

## 2018-11-07 ENCOUNTER — Ambulatory Visit (INDEPENDENT_AMBULATORY_CARE_PROVIDER_SITE_OTHER): Payer: Medicare Other

## 2018-11-07 VITALS — BP 133/75 | HR 65 | Ht 65.0 in | Wt 156.0 lb

## 2018-11-07 DIAGNOSIS — Z0000 Encounter for general adult medical examination without abnormal findings: Secondary | ICD-10-CM

## 2018-11-07 NOTE — Patient Instructions (Signed)
Meredith Wilcox , Thank you for taking time to come for your Medicare Wellness Visit. I appreciate your ongoing commitment to your health goals. Please review the following plan we discussed and let me know if I can assist you in the future.   Screening recommendations/referrals: Colonoscopy: done 03/10/11 Mammogram: postponed Bone Density: postponed Recommended yearly ophthalmology/optometry visit for glaucoma screening and checkup Recommended yearly dental visit for hygiene and checkup  Vaccinations: Influenza vaccine: postponed Pneumococcal vaccine: postponed Tdap vaccine: postponed Shingles vaccine: postponed    Advanced directives: Advance directive discussed with you today. I have mailed a copy for you to complete at home and have notarized. Once this is complete please bring a copy in to our office so we can scan it into your chart.  Conditions/risks identified: Keep up the great work!  Next appointment: Please follow up in one year for your Medicare Annual Wellness visit.     Preventive Care 32 Years and Older, Female Preventive care refers to lifestyle choices and visits with your health care provider that can promote health and wellness. What does preventive care include?  A yearly physical exam. This is also called an annual well check.  Dental exams once or twice a year.  Routine eye exams. Ask your health care provider how often you should have your eyes checked.  Personal lifestyle choices, including:  Daily care of your teeth and gums.  Regular physical activity.  Eating a healthy diet.  Avoiding tobacco and drug use.  Limiting alcohol use.  Practicing safe sex.  Taking low-dose aspirin every day.  Taking vitamin and mineral supplements as recommended by your health care provider. What happens during an annual well check? The services and screenings done by your health care provider during your annual well check will depend on your age, overall health,  lifestyle risk factors, and family history of disease. Counseling  Your health care provider may ask you questions about your:  Alcohol use.  Tobacco use.  Drug use.  Emotional well-being.  Home and relationship well-being.  Sexual activity.  Eating habits.  History of falls.  Memory and ability to understand (cognition).  Work and work Statistician.  Reproductive health. Screening  You may have the following tests or measurements:  Height, weight, and BMI.  Blood pressure.  Lipid and cholesterol levels. These may be checked every 5 years, or more frequently if you are over 34 years old.  Skin check.  Lung cancer screening. You may have this screening every year starting at age 24 if you have a 30-pack-year history of smoking and currently smoke or have quit within the past 15 years.  Fecal occult blood test (FOBT) of the stool. You may have this test every year starting at age 67.  Flexible sigmoidoscopy or colonoscopy. You may have a sigmoidoscopy every 5 years or a colonoscopy every 10 years starting at age 77.  Hepatitis C blood test.  Hepatitis B blood test.  Sexually transmitted disease (STD) testing.  Diabetes screening. This is done by checking your blood sugar (glucose) after you have not eaten for a while (fasting). You may have this done every 1-3 years.  Bone density scan. This is done to screen for osteoporosis. You may have this done starting at age 54.  Mammogram. This may be done every 1-2 years. Talk to your health care provider about how often you should have regular mammograms. Talk with your health care provider about your test results, treatment options, and if necessary, the need for  more tests. Vaccines  Your health care provider may recommend certain vaccines, such as:  Influenza vaccine. This is recommended every year.  Tetanus, diphtheria, and acellular pertussis (Tdap, Td) vaccine. You may need a Td booster every 10 years.  Zoster  vaccine. You may need this after age 62.  Pneumococcal 13-valent conjugate (PCV13) vaccine. One dose is recommended after age 79.  Pneumococcal polysaccharide (PPSV23) vaccine. One dose is recommended after age 70. Talk to your health care provider about which screenings and vaccines you need and how often you need them. This information is not intended to replace advice given to you by your health care provider. Make sure you discuss any questions you have with your health care provider. Document Released: 04/25/2015 Document Revised: 12/17/2015 Document Reviewed: 01/28/2015 Elsevier Interactive Patient Education  2017 [employer] Prevention in the Home Falls can cause injuries. They can happen to people of all ages. There are many things you can do to make your home safe and to help prevent falls. What can I do on the outside of my home?  Regularly fix the edges of walkways and driveways and fix any cracks.  Remove anything that might make you trip as you walk through a door, such as a raised step or threshold.  Trim any bushes or trees on the path to your home.  Use bright outdoor lighting.  Clear any walking paths of anything that might make someone trip, such as rocks or tools.  Regularly check to see if handrails are loose or broken. Make sure that both sides of any steps have handrails.  Any raised decks and porches should have guardrails on the edges.  Have any leaves, snow, or ice cleared regularly.  Use sand or salt on walking paths during winter.  Clean up any spills in your garage right away. This includes oil or grease spills. What can I do in the bathroom?  Use night lights.  Install grab bars by the toilet and in the tub and shower. Do not use towel bars as grab bars.  Use non-skid mats or decals in the tub or shower.  If you need to sit down in the shower, use a plastic, non-slip stool.  Keep the floor dry. Clean up any water that spills on the  floor as soon as it happens.  Remove soap buildup in the tub or shower regularly.  Attach bath mats securely with double-sided non-slip rug tape.  Do not have throw rugs and other things on the floor that can make you trip. What can I do in the bedroom?  Use night lights.  Make sure that you have a light by your bed that is easy to reach.  Do not use any sheets or blankets that are too big for your bed. They should not hang down onto the floor.  Have a firm chair that has side arms. You can use this for support while you get dressed.  Do not have throw rugs and other things on the floor that can make you trip. What can I do in the kitchen?  Clean up any spills right away.  Avoid walking on wet floors.  Keep items that you use a lot in easy-to-reach places.  If you need to reach something above you, use a strong step stool that has a grab bar.  Keep electrical cords out of the way.  Do not use floor polish or wax that makes floors slippery. If you must use wax, use non-skid  floor wax.  Do not have throw rugs and other things on the floor that can make you trip. What can I do with my stairs?  Do not leave any items on the stairs.  Make sure that there are handrails on both sides of the stairs and use them. Fix handrails that are broken or loose. Make sure that handrails are as long as the stairways.  Check any carpeting to make sure that it is firmly attached to the stairs. Fix any carpet that is loose or worn.  Avoid having throw rugs at the top or bottom of the stairs. If you do have throw rugs, attach them to the floor with carpet tape.  Make sure that you have a light switch at the top of the stairs and the bottom of the stairs. If you do not have them, ask someone to add them for you. What else can I do to help prevent falls?  Wear shoes that:  Do not have high heels.  Have rubber bottoms.  Are comfortable and fit you well.  Are closed at the toe. Do not wear  sandals.  If you use a stepladder:  Make sure that it is fully opened. Do not climb a closed stepladder.  Make sure that both sides of the stepladder are locked into place.  Ask someone to hold it for you, if possible.  Clearly mark and make sure that you can see:  Any grab bars or handrails.  First and last steps.  Where the edge of each step is.  Use tools that help you move around (mobility aids) if they are needed. These include:  Canes.  Walkers.  Scooters.  Crutches.  Turn on the lights when you go into a dark area. Replace any light bulbs as soon as they burn out.  Set up your furniture so you have a clear path. Avoid moving your furniture around.  If any of your floors are uneven, fix them.  If there are any pets around you, be aware of where they are.  Review your medicines with your doctor. Some medicines can make you feel dizzy. This can increase your chance of falling. Ask your doctor what other things that you can do to help prevent falls. This information is not intended to replace advice given to you by your health care provider. Make sure you discuss any questions you have with your health care provider. Document Released: 01/23/2009 Document Revised: 09/04/2015 Document Reviewed: 05/03/2014 Elsevier Interactive Patient Education  2017 [employer].

## 2018-11-07 NOTE — Progress Notes (Signed)
Subjective:   Meredith Wilcox is a 78 y.o. female who presents for Medicare Annual (Subsequent) preventive examination.   Virtual Visit via Telephone Note  I connected with Meredith Wilcox on 11/07/18 at  9:40 AM EDT by telephone and verified that I am speaking with the correct person using two identifiers.  Medicare Annual Wellness visit completed telephonically due to Covid-19 pandemic.   Location: Patient: home  Provider: office   I discussed the limitations, risks, security and privacy concerns of performing an evaluation and management service by telephone and the availability of in person appointments. The patient expressed understanding and agreed to proceed.  Some vital signs may be absent or patient reported.  Clemetine Marker, LPN  Review of Systems:   Cardiac Risk Factors include: advanced age (>83men, >76 women);dyslipidemia;hypertension     Objective:     Vitals: BP 133/75 (BP Location: Left Wrist)   Pulse 65   Ht 5\' 5"  (1.651 m)   Wt 156 lb (70.8 kg)   BMI 25.96 kg/m   Body mass index is 25.96 kg/m.  Advanced Directives 11/07/2018 11/01/2017 12/02/2016 11/18/2016 07/28/2016 06/17/2016 06/03/2016  Does Patient Have a Medical Advance Directive? No No Yes Yes Yes Yes Yes  Type of Advance Directive - Public librarian;Living will - Living will [place];Living will -  Does patient want to make changes to medical advance directive? - - No - Patient declined - - - -  Copy of [place] in Chart? - - No - copy requested - - - -  Would patient like information on creating a medical advance directive? Yes (MAU/Ambulatory/Procedural Areas - Information given) Yes (MAU/Ambulatory/Procedural Areas - Information given) - - - - -    Tobacco Social History   Tobacco Use  Smoking Status Never Smoker  Smokeless Tobacco Never Used  Tobacco Comment   smoking cessation materials not required     Counseling given: Not Answered Comment:  smoking cessation materials not required   Clinical Intake:  Pre-visit preparation completed: Yes  Pain : No/denies pain     BMI - recorded: 25.96 Nutritional Status: BMI 25 -29 Overweight Nutritional Risks: None Diabetes: No  How often do you need to have someone help you when you read instructions, pamphlets, or other written materials from your doctor or pharmacy?: 1 - Never  Interpreter Needed?: No  Information entered by :: Clemetine Marker LPN  Past Medical History:  Diagnosis Date  . Anemia    H/O DURING PREGNANCY  . Arthritis   . Chronic kidney disease    KIDNEY PROBLEMS AROUND AGE 49  . Geographic tongue   . GERD (gastroesophageal reflux disease)    H/O NO MEDS  . Helicobacter pylori gastrointestinal tract infection   . Hyperlipidemia   . Incontinence   . Indigestion   . Prolapse of vaginal walls   . Right sided sciatica   . Tachycardia   . Urethral prolapse    Past Surgical History:  Procedure Laterality Date  . BREAST BIOPSY Left 10/11/2016   left breast stereo/ VASCULAR LESION  . BREAST BIOPSY Left 12/02/2016   Procedure: NEEDLE LOCALIZATION;  Surgeon: [name], MD;  Location: [facility] ORS;  Service: General;  Laterality: Left;  . BREAST EXCISIONAL BIOPSY Left 12/02/2016   left lumpectomy  . BREAST LUMPECTOMY Left 12/02/2016   Procedure: EXCISION BREAST MASS;  Surgeon: [name], MD;  Location: [facility] ORS;  Service: General;  Laterality: Left;  . DILATION AND  CURETTAGE OF UTERUS    . dnc     1983  . fatty tumor removal Right    hand   Family History  Problem Relation Age of Onset  . Diabetes Mother   . Hypertension Mother   . Cardiomyopathy Sister   . Down syndrome Sister   . Leukemia Brother   . Alcohol abuse Son        youngest son  . Asthma Son        youngest son  . Rheum arthritis Sister   . Drug abuse Child 23  . Breast cancer Neg Hx   . Kidney cancer Neg Hx   . Bladder Cancer Neg Hx   . Prostate cancer Neg Hx     Social History   Socioeconomic History  . Marital status: Married    Spouse name: [name]  . Number of children: 4  . Years of education: some college  . Highest education level: 12th grade  Occupational History  . Occupation: Retired  Scientific laboratory technician  . Financial resource strain: Not hard at all  . Food insecurity    Worry: Never true    Inability: Never true  . Transportation needs    Medical: No    Non-medical: No  Tobacco Use  . Smoking status: Never Smoker  . Smokeless tobacco: Never Used  . Tobacco comment: smoking cessation materials not required  Substance and Sexual Activity  . Alcohol use: No    Alcohol/week: 0.0 standard drinks  . Drug use: No  . Sexual activity: Not Currently    Birth control/protection: Post-menopausal  Lifestyle  . Physical activity    Days per week: 7 days    Minutes per session: 30 min  . Stress: Not at all  Relationships  . Social connections    Talks on phone: More than three times a week    Gets together: Twice a week    Attends religious service: More than 4 times per year    Active member of club or organization: No    Attends meetings of clubs or organizations: Never    Relationship status: Married  Other Topics Concern  . Not on file  Social History Narrative  . Not on file    Outpatient Encounter Medications as of 11/07/2018  Medication Sig  . Cholecalciferol (VITAMIN D) 2000 UNITS tablet Take 2,000 Units by mouth daily. Reported on 06/04/2015  . conjugated estrogens (PREMARIN) vaginal cream Place 1 Applicatorful vaginally daily. Apply 0.5mg  (pea-sized amount)  just inside the vaginal introitus with a finger-tip every night for two weeks and then Monday, Wednesday and Friday nights.  . Multiple Vitamins-Minerals ([name] PO) Take 1 tablet by mouth daily.   . Omega-3 Fatty Acids (OMEGA-3 PO) Take 1 capsule by mouth 2 (two) times daily. Once to twice daily (pt sometimes forgets 2nd dose)  . TURMERIC PO Take 1 capsule  by mouth daily.   . [DISCONTINUED] OXYQUINOLONE SULFATE VAGINAL (TRIMO-SAN) 0.025 % GEL Place vaginally.  . [DISCONTINUED] rivaroxaban (XARELTO) 20 MG TABS tablet Take 1 tablet (20 mg total) by mouth daily with supper. (Patient not taking: Reported on 11/07/2018)   No facility-administered encounter medications on file as of 11/07/2018.     Activities of Daily Living In your present state of health, do you have any difficulty performing the following activities: 11/07/2018 06/13/2018  Hearing? N N  Comment declines hearing aids -  Vision? Tempie Donning  Comment wears glasses -  Difficulty concentrating or making decisions?  N N  Walking or climbing stairs? N N  Dressing or bathing? N N  Doing errands, shopping? N N  Preparing Food and eating ? N -  Using the Toilet? N -  In the past six months, have you accidently leaked urine? Y -  Comment cystocele -  Do you have problems with loss of bowel control? N -  Managing your Medications? N -  Managing your Finances? N -  Housekeeping or managing your Housekeeping? N -  Some recent data might be hidden    Patient Care Team: [name], MD as PCP - General (Family Medicine) Birder Robson, MD as Consulting Physician (Ophthalmology)    Assessment:   This is a routine wellness examination for [facility].  Exercise Activities and Dietary recommendations Current Exercise Habits: Home exercise routine, Type of exercise: calisthenics;walking, Time (Minutes): 30, Frequency (Times/Week): 7, Weekly Exercise (Minutes/Week): 210, Intensity: Moderate, Exercise limited by: None identified  Goals    . DIET - INCREASE WATER INTAKE     Recommend to drink at least 6-8 8oz glasses of water per day.       Fall Risk Fall Risk  11/07/2018 06/13/2018 11/01/2017 08/03/2017 08/03/2017  Falls in the past year? 0 0 No No No  Number falls in past yr: 0 0 - - -  Injury with Fall? 0 0 - - -  Risk for fall due to : - - Impaired vision - -  Risk for fall due to: Comment -  - wears eyeglasses - -  Follow up Falls prevention discussed Falls evaluation completed - - -   FALL RISK PREVENTION PERTAINING TO THE HOME:  Any stairs in or around the home? Yes  If so, do they handrails? Yes   Home free of loose throw rugs in walkways, pet beds, electrical cords, etc? Yes  Adequate lighting in your home to reduce risk of falls? Yes   ASSISTIVE DEVICES UTILIZED TO PREVENT FALLS:  Life alert? No  Use of a cane, walker or w/c? No  Grab bars in the bathroom? Yes  Shower chair or bench in shower? Yes  Elevated toilet seat or a handicapped toilet? No   DME ORDERS:  DME order needed?  No   TIMED UP AND GO:  Was the test performed? No . Telephonic visit.   Education: Fall risk prevention has been discussed.  Intervention(s) required? No    Depression Screen PHQ 2/9 Scores 11/07/2018 06/13/2018 11/01/2017 07/28/2017  PHQ - 2 Score 0 0 0 0  PHQ- 9 Score - 0 0 -     Cognitive Function     6CIT Screen 11/07/2018 11/01/2017  What Year? 0 points 0 points  What month? 0 points 0 points  What time? 0 points 3 points  Count back from 20 0 points 0 points  Months in reverse 0 points 0 points  Repeat phrase 2 points 4 points  Total Score 2 7    There is no immunization history for the selected administration types on file for this patient.  Qualifies for Shingles Vaccine? Yes . Due for Shingrix. Education has been provided regarding the importance of this vaccine. Pt has been advised to call insurance company to determine out of pocket expense. Advised may also receive vaccine at local pharmacy or Health Dept. Verbalized acceptance and understanding.  Tdap: Although this vaccine is not a covered service during a Wellness Exam, does the patient still wish to receive this vaccine today?  No .  Education has been  provided regarding the importance of this vaccine. Advised may receive this vaccine at local pharmacy or Health Dept. Aware to provide a copy of the vaccination  record if obtained from local pharmacy or Health Dept. Verbalized acceptance and understanding.  Flu Vaccine: Due for Flu vaccine. Does the patient want to receive this vaccine today?  No . Education has been provided regarding the importance of this vaccine but still declined. Advised may receive this vaccine at local pharmacy or Health Dept. Aware to provide a copy of the vaccination record if obtained from local pharmacy or Health Dept. Verbalized acceptance and understanding.  Pneumococcal Vaccine: Due for Pneumococcal vaccine. Does the patient want to receive this vaccine today?  No . Education has been provided regarding the importance of this vaccine but still declined. Advised may receive this vaccine at local pharmacy or Health Dept. Aware to provide a copy of the vaccination record if obtained from local pharmacy or Health Dept. Verbalized acceptance and understanding.   Screening Tests Health Maintenance  Topic Date Due  . MAMMOGRAM  09/01/2017  . DEXA SCAN  01/25/2019 (Originally 01/17/2007)  . TETANUS/TDAP  09/29/2028 (Originally 01/16/1960)  . INFLUENZA VACCINE  03/02/2029 (Originally 11/11/2018)  . PNA vac Low Risk Adult (1 of 2 - PCV13) 03/02/2029 (Originally 01/17/2007)    Cancer Screenings:  Colorectal Screening: Completed 03/10/11. Repeat every 10 years  Mammogram: Completed 09/01/16. Repeat every year; pt declines  Bone Density: DUE - ordered on 06/12/18. Pt declines.    Lung Cancer Screening: (Low Dose CT Chest recommended if Age 64-80 years, 30 pack-year currently smoking OR have quit w/in 15years.) does not qualify.   Additional Screening:  Hepatitis C Screening: no longer required.   Vision Screening: Recommended annual ophthalmology exams for early detection of glaucoma and other disorders of the eye. Is the patient up to date with their annual eye exam?  Yes  Who is the provider or what is the name of the office in which the pt attends annual eye exams? [place] Screening: Recommended annual dental exams for proper oral hygiene  Community Resource Referral:  CRR required this visit?  No      Plan:     I have personally reviewed and addressed the Medicare Annual Wellness questionnaire and have noted the following in the patient's chart:  A. Medical and social history B. Use of alcohol, tobacco or illicit drugs  C. Current medications and supplements D. Functional ability and status E.  Nutritional status F.  Physical activity G. Advance directives H. List of other physicians I.  Hospitalizations, surgeries, and ER visits in previous 12 months J.  [place] such as hearing and vision if needed, cognitive and depression L. Referrals and appointments   In addition, I have reviewed and discussed with patient certain preventive protocols, quality metrics, and best practice recommendations. A written personalized care plan for preventive services as well as general preventive health recommendations were provided to patient.   Signed,  Clemetine Marker, LPN Nurse Health Advisor   Nurse Notes: pt doing well and appreciative of visit today, she declines all screenings and vaccines. Pt c/o episode of mucous in stool 2-3 weeks ago after she had taken a laxative and experienced cramps but nothing since.

## 2018-12-14 ENCOUNTER — Encounter: Payer: Self-pay | Admitting: Family Medicine

## 2018-12-14 ENCOUNTER — Ambulatory Visit (INDEPENDENT_AMBULATORY_CARE_PROVIDER_SITE_OTHER): Payer: Medicare Other | Admitting: Family Medicine

## 2018-12-14 ENCOUNTER — Ambulatory Visit: Payer: Medicare Other | Admitting: Family Medicine

## 2018-12-14 DIAGNOSIS — Z86718 Personal history of other venous thrombosis and embolism: Secondary | ICD-10-CM

## 2018-12-14 DIAGNOSIS — D708 Other neutropenia: Secondary | ICD-10-CM | POA: Diagnosis not present

## 2018-12-14 DIAGNOSIS — N8189 Other female genital prolapse: Secondary | ICD-10-CM | POA: Diagnosis not present

## 2018-12-14 DIAGNOSIS — R5383 Other fatigue: Secondary | ICD-10-CM

## 2018-12-14 NOTE — Progress Notes (Signed)
Name: Meredith Wilcox   MRN: [id number]    DOB: [birth year]   Date:12/14/2018       Progress Note  Subjective  Chief Complaint  Chief Complaint  Patient presents with   Medication Refill    6 month F/U   Other neutropenia   History of DVT (deep vein thrombosis)    I connected with  [name] on 12/14/18 at  9:40 AM EDT by telephone and verified that I am speaking with the correct person using two identifiers.  I discussed the limitations, risks, security and privacy concerns of performing an evaluation and management service by telephone and the availability of in person appointments. Staff also discussed with the patient that there may be a patient responsible charge related to this service. Patient Location: at home Provider Location: [facility]   HPI  Leucopenia: she has a long history of leucopenia but stable, at 3.3, six months ago. Anemia resolved, and normal iron studies. We will continue to monitor   Cystocele : does not like pessary, seen by Urologist 04/2018, she is using estrogen cream three times a week, Dr. Matilde Sprang discussed hysterectomy and cystocele repair. She states she is not ready for surgery at this time  History of DVT: Spring of 2019, resolved on repeat doppler US 10/2017. No problems since. She denies leg pain, swelling or SOB   Discussed importance of mammogram and bone density but she is worried about radiation, discussed low radiation exposure. Last mammogram was done in 2018, she had extra views and supposed to go back but lost to follow up with Dr. Jamal Collin   Other fatigue: she states she has been feeling tired and lack of motivation . 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    Past Surgical History:  Procedure Laterality Date   BREAST BIOPSY Left 10/11/2016   left breast stereo/ VASCULAR LESION   BREAST BIOPSY Left 12/02/2016   Procedure: NEEDLE LOCALIZATION;  Surgeon: [name], MD;  Location: [facility] ORS;  Service: General;  Laterality: Left;   BREAST EXCISIONAL BIOPSY Left 12/02/2016   left lumpectomy   BREAST LUMPECTOMY Left 12/02/2016   Procedure: EXCISION BREAST MASS;  Surgeon: [name], MD;  Location: [facility] ORS;  Service: General;  Laterality: Left;   DILATION AND CURETTAGE OF UTERUS     dnc     1983   fatty tumor removal Right    hand    Family History  Problem Relation Age of Onset   Diabetes Mother    Hypertension Mother    Cardiomyopathy Sister    Down syndrome Sister    Leukemia Brother    Alcohol abuse Son        youngest son   Asthma Son        youngest son   Rheum arthritis Sister    Drug abuse Child 12   Breast cancer Neg Hx    Kidney cancer Neg Hx    Bladder Cancer Neg Hx    Prostate cancer Neg Hx  Social History   Socioeconomic History   Marital status: Married    Spouse name: [name]   Number of children: 4   Years of education: some college   Highest education level: 12th grade  Occupational History   Occupation: Retired  Scientist, product/process development strain: Not hard at [employer] insecurity    Worry: Never true    Inability: Never true   Transportation needs    Medical: No    Non-medical: No  Tobacco Use   Smoking status: Never Smoker   Smokeless tobacco: Never Used   Tobacco comment: smoking cessation materials not required  Substance and Sexual Activity   Alcohol use: No    Alcohol/week: 0.0 standard drinks   Drug  use: No   Sexual activity: Not Currently    Birth control/protection: Post-menopausal  Lifestyle   Physical activity    Days per week: 7 days    Minutes per session: 30 min   Stress: Not at all  Relationships   Social connections    Talks on phone: More than three times a week    Gets together: Twice a week    Attends religious service: More than 4 times per year    Active member of club or organization: No    Attends meetings of clubs or organizations: Never    Relationship status: Married   Intimate partner violence    Fear of current or ex partner: No    Emotionally abused: No    Physically abused: No    Forced sexual activity: No  Other Topics Concern   Not on file  Social History Narrative   Not on file     Current Outpatient Medications:    Cholecalciferol (VITAMIN D) 2000 UNITS tablet, Take 2,000 Units by mouth daily. Reported on 06/04/2015, Disp: , Rfl:    conjugated estrogens (PREMARIN) vaginal cream, Place 1 Applicatorful vaginally daily. Apply 0.5mg  (pea-sized amount)  just inside the vaginal introitus with a finger-tip every night for two weeks and then Monday, Wednesday and Friday nights., Disp: 30 g, Rfl: 12   Multiple Vitamins-Minerals ([name] PO), Take 1 tablet by mouth daily. , Disp: , Rfl:    Omega-3 Fatty Acids (OMEGA-3 PO), Take 1 capsule by mouth 2 (two) times daily. Once to twice daily (pt sometimes forgets 2nd dose), Disp: , Rfl:    TURMERIC PO, Take 1 capsule by mouth daily. , Disp: , Rfl:   Allergies  Allergen Reactions   Tussionex Pennkinetic Er [Hydrocod Polst-Cpm Polst Er] Other (See Comments)    Real dizzy headed even after a while still walking every direction but straight, patient stated she stopped taking it because of that.   Hydrocodone     Light headed and dizziness    I personally reviewed active problem list, medication list, allergies, family history, social history with the patient/caregiver  today.   ROS  Ten systems reviewed and is negative except as mentioned in HPI   Objective  Virtual encounter, vitals not obtained.  There is no height or weight on file to calculate BMI.  Physical Exam  Awake, alert and oriented  PHQ2/9: Depression screen [facility] 2/9 12/14/2018 11/07/2018 06/13/2018 11/01/2017 07/28/2017  Decreased Interest 0 0 0 0 0  Down, Depressed, Hopeless 0 0 0 0 0  PHQ - 2 Score 0 0 0 0 0  Altered sleeping 0 - 0 0 -  Tired, decreased energy 0 - 0 0 -  Change in appetite  0 - 0 0 -  Feeling bad or failure about yourself  0 - 0 0 -  Trouble concentrating 0 - 0 0 -  Moving slowly or fidgety/restless 0 - 0 0 -  Suicidal thoughts 0 - 0 0 -  PHQ-9 Score 0 - 0 0 -  Difficult doing work/chores Not difficult at all - - Not difficult at all -   PHQ-2/9 Result is negative.    Fall Risk: Fall Risk  12/14/2018 11/07/2018 06/13/2018 11/01/2017 08/03/2017  Falls in the past year? 0 0 0 No No  Number falls in past yr: 0 0 0 - -  Injury with Fall? 0 0 0 - -  Risk for fall due to : - - - Impaired vision -  Risk for fall due to: Comment - - - wears eyeglasses -  Follow up - Falls prevention discussed Falls evaluation completed - -    Assessment & Plan  1. History of DVT (deep vein thrombosis)  Doing well  2. Other neutropenia ([place])  Recheck yearly   3. Pelvic floor relaxation  Worse symptoms is at night, not ready for surgery, wear panty liners  4. Other fatigue  Discussed getting labs, may be mild dysthymia    I discussed the assessment and treatment plan with the patient. The patient was provided an opportunity to ask questions and all were answered. The patient agreed with the plan and demonstrated an understanding of the instructions.   The patient was advised to call back or seek an in-person evaluation if the symptoms worsen or if the condition fails to improve as anticipated.  I provided 15  minutes of non-face-to-face time during this encounter.  Loistine Chance, MD

## 2019-01-23 ENCOUNTER — Ambulatory Visit: Payer: Self-pay

## 2019-01-23 NOTE — Telephone Encounter (Signed)
Pt. Has 2 areas to right calf. One blister has popped and is red around it. Itchy. Minor pain. The other blister has fluid in it. No availability in the practice per Melissa.. Pt. Will go to UC.  Answer Assessment - Initial Assessment Questions 1. APPEARANCE of SORES: "What do the sores look like?"     Blister that popped 2. NUMBER: "How many sores are there?"     2 3. SIZE: "How big is the largest sore?"     Larger than a quarter 4. LOCATION: "Where are the sores located?"     Both on the right calf 5. ONSET: "When did the sores begin?"     Started 1-2 weeks 6. CAUSE: "What do you think is causing the sores?"     Unsure 7. OTHER SYMPTOMS: "Do you have any other symptoms?" (e.g., fever, new weakness)     No  Protocols used: SORES-A-AH

## 2019-01-24 ENCOUNTER — Other Ambulatory Visit: Payer: Self-pay

## 2019-01-24 DIAGNOSIS — Z23 Encounter for immunization: Secondary | ICD-10-CM | POA: Diagnosis not present

## 2019-01-24 DIAGNOSIS — Z20822 Contact with and (suspected) exposure to covid-19: Secondary | ICD-10-CM

## 2019-01-24 DIAGNOSIS — S81802A Unspecified open wound, left lower leg, initial encounter: Secondary | ICD-10-CM | POA: Diagnosis not present

## 2019-01-24 DIAGNOSIS — L089 Local infection of the skin and subcutaneous tissue, unspecified: Secondary | ICD-10-CM | POA: Diagnosis not present

## 2019-01-26 LAB — NOVEL CORONAVIRUS, NAA: SARS-CoV-2, NAA: NOT DETECTED

## 2019-01-30 ENCOUNTER — Telehealth: Payer: Self-pay | Admitting: General Practice

## 2019-01-30 NOTE — Telephone Encounter (Signed)
Patient informed of negative covid result. She verbalized understanding.

## 2019-01-31 ENCOUNTER — Other Ambulatory Visit: Payer: Self-pay | Admitting: Urology

## 2019-01-31 NOTE — Telephone Encounter (Signed)
Pt needs refill for Premarin cream sent to [facility].

## 2019-02-05 MED ORDER — PREMARIN 0.625 MG/GM VA CREA: TOPICAL_CREAM | VAGINAL | 12 refills | Status: DC

## 2019-04-19 NOTE — Progress Notes (Deleted)
04/20/2019 7:50 PM   Billiejean Creely 10/30/1940 [id number]  Referring provider: [name], MD [address] [place],  [place] [address]  No chief complaint on file.   HPI: Meredith Wilcox is a 79 year old female with urge incontinence and cystocele who presents today for follow up.  The patient is  experiencing urgency x *** (***), frequency x *** (***), not/is restricting fluids to avoid visits to the restroom ***, not/is engaging in toilet mapping, incontinence x *** (***) and nocturia x *** (***).   Her BP is ***.   Her PVR is ***.     PMH: Past Medical History:  Diagnosis Date  . Anemia    H/O DURING PREGNANCY  . Arthritis   . Chronic kidney disease    KIDNEY PROBLEMS AROUND AGE 79  . Geographic tongue   . GERD (gastroesophageal reflux disease)    H/O NO MEDS  . Helicobacter pylori gastrointestinal tract infection   . Hyperlipidemia   . Incontinence   . Indigestion   . Prolapse of vaginal walls   . Right sided sciatica   . Tachycardia   . Urethral prolapse     Surgical History: Past Surgical History:  Procedure Laterality Date  . BREAST BIOPSY Left 10/11/2016   left breast stereo/ VASCULAR LESION  . BREAST BIOPSY Left 12/02/2016   Procedure: NEEDLE LOCALIZATION;  Surgeon: [name], MD;  Location: [facility] OR[facility];  Service: General;  Laterality: Left;  . BREAST EXCISIONAL BIOPSY Left 12/02/2016   left lumpectomy  . BREAST LUMPECTOMY Left 12/02/2016   Procedure: EXCISION BREAST MASS;  Surgeon: [name], MD;  Location: [facility] OR[facility];  Service: General;  Laterality: Left;  . DILATION AND CURETTAGE OF UTERUS    . dnc     1983  . fatty tumor removal Right    hand    Home Medications:  Allergies as of 04/20/2019      Reactions   Tussionex Pennkinetic Er [hydrocod Polst-cpm Polst Er] Other (See Comments)   Real dizzy headed even after a while still walking every direction but straight, patient stated she stopped taking it because of that.     Hydrocodone    Light headed and dizziness      Medication List       Accurate as of April 19, 2019  7:50 PM. If you have any questions, ask your nurse or doctor.        [name] PO Take 1 tablet by mouth daily.   OMEGA-3 PO Take 1 capsule by mouth 2 (two) times daily. Once to twice daily (pt sometimes forgets 2nd dose)   Premarin vaginal cream Generic drug: conjugated estrogens Apply pea size nightly   TURMERIC PO Take 1 capsule by mouth daily.   Vitamin D 50 MCG (2000 UT) tablet Take 2,000 Units by mouth daily. Reported on 06/04/2015       Allergies:  Allergies  Allergen Reactions  . Tussionex Pennkinetic Er [Hydrocod Polst-Cpm Polst Er] Other (See Comments)    Real dizzy headed even after a while still walking every direction but straight, patient stated she stopped taking it because of that.  . Hydrocodone     Light headed and dizziness    Family History: Family History  Problem Relation Age of Onset  . Diabetes Mother   . Hypertension Mother   . Cardiomyopathy Sister   . Down syndrome Sister   . Leukemia Brother   . Alcohol abuse Son  youngest son  . Asthma Son        youngest son  . Rheum arthritis Sister   . Drug abuse Child 23  . Breast cancer Neg Hx   . Kidney cancer Neg Hx   . Bladder Cancer Neg Hx   . Prostate cancer Neg Hx     Social History:  reports that she has never smoked. She has never used smokeless tobacco. She reports that she does not drink alcohol or use drugs.  ROS:                                        Physical Exam: There were no vitals taken for this visit.  Constitutional:  Well nourished. Alert and oriented, No acute distress. HEENT: [place] AT, moist mucus membranes.  Trachea midline, no masses. Cardiovascular: No clubbing, cyanosis, or edema. Respiratory: Normal respiratory effort, no increased work of breathing. GI: Abdomen is soft, non tender, non distended, no abdominal masses.  Liver and spleen not palpable.  No hernias appreciated.  Stool sample for occult testing is not indicated.   GU: No CVA tenderness.  No bladder fullness or masses.  *** external genitalia, *** pubic hair distribution, no lesions.  Normal urethral meatus, no lesions, no prolapse, no discharge.   No urethral masses, tenderness and/or tenderness. No bladder fullness, tenderness or masses. *** vagina mucosa, *** estrogen effect, no discharge, no lesions, *** pelvic support, *** cystocele and *** rectocele noted.  No cervical motion tenderness.  Uterus is freely mobile and non-fixed.  No adnexal/parametria masses or tenderness noted.  Anus and perineum are without rashes or lesions.   ***  Skin: No rashes, bruises or suspicious lesions. Lymph: No cervical or inguinal adenopathy. Neurologic: Grossly intact, no focal deficits, moving all 4 extremities. Psychiatric: Normal mood and affect.   Laboratory Data: Lab Results  Component Value Date   WBC 3.3 (L) 06/13/2018   HGB 12.0 06/13/2018   HCT 35.5 06/13/2018   MCV 87.4 06/13/2018   PLT 196 06/13/2018    Lab Results  Component Value Date   CREATININE 0.80 08/03/2017    No results found for: PSA  No results found for: TESTOSTERONE  No results found for: HGBA1C  Urinalysis No results found for: COLORURINE, APPEARANCEUR, LABSPEC, PHURINE, GLUCOSEU, HGBUR, BILIRUBINUR, KETONESUR, PROTEINUR, UROBILINOGEN, NITRITE, LEUKOCYTESUR  Pertinent Imaging:   Assessment & Plan:   1. Urge incontinence ***  2. Pelvic prolapse  ***  3. Vaginal atrophy ***   Patient has mildly symptomatic prolapse that is not bothering her a lot.  Estrogen cream is for the prolapse.  Picture was drawn.  Role of urodynamics discussed.  If the patient ever had surgery she would likely best benefit from a transvaginal hysterectomy with cystocele repair and vault prolapse.  Natural history discussed.  Patient does not wish to have surgery and will follow-up with  [name] in 1 year  There are no diagnoses linked to this encounter.  No follow-ups on file.  Zara Council, PA-C  [facility] [address] [place], [place] [address] [phone]

## 2019-04-20 ENCOUNTER — Ambulatory Visit: Payer: Medicare Other | Admitting: Urology

## 2019-04-20 ENCOUNTER — Encounter: Payer: Self-pay | Admitting: Urology

## 2019-05-14 ENCOUNTER — Ambulatory Visit (INDEPENDENT_AMBULATORY_CARE_PROVIDER_SITE_OTHER): Payer: Medicare Other | Admitting: Urology

## 2019-05-14 ENCOUNTER — Encounter: Payer: Self-pay | Admitting: Urology

## 2019-05-14 ENCOUNTER — Other Ambulatory Visit: Payer: Self-pay

## 2019-05-14 VITALS — BP 150/66 | HR 81 | Ht 65.0 in | Wt 150.0 lb

## 2019-05-14 DIAGNOSIS — N3941 Urge incontinence: Secondary | ICD-10-CM | POA: Diagnosis not present

## 2019-05-14 DIAGNOSIS — N8111 Cystocele, midline: Secondary | ICD-10-CM

## 2019-05-14 DIAGNOSIS — N952 Postmenopausal atrophic vaginitis: Secondary | ICD-10-CM | POA: Diagnosis not present

## 2019-05-14 LAB — BLADDER SCAN AMB NON-IMAGING: Scan Result: 63

## 2019-05-14 NOTE — Progress Notes (Signed)
05/14/2019 1:39 PM   Meredith Wilcox 04-11-1941 [id number]  Referring provider: [name], MD [address] [place],  [place] [address]  Chief Complaint  Patient presents with  . Urinary Incontinence    HPI: [name] is a 79 year old female with urge incontinence and cystocele who presents today for follow up.  The patient is  experiencing urgency x 0-3 (improved), frequency x 4-7 (improved), is restricting fluids to avoid visits to the restroom, is engaging in toilet mapping, incontinence x 0-3 (stable) and nocturia x 0-3 (stable).   Her BP is 150/66.   Her PVR is 63 mL.  She is experiencing frequency when she drinks a lot of water and nocturia.   She is applying her vaginal estrogen cream on Monday, Wednesday and Friday nights.  Patient denies any modifying or aggravating factors.  Patient denies any gross hematuria, dysuria or suprapubic/flank pain.  Patient denies any fevers, chills, nausea or vomiting.    PMH: Past Medical History:  Diagnosis Date  . Anemia    H/O DURING PREGNANCY  . Arthritis   . Chronic kidney disease    KIDNEY PROBLEMS AROUND AGE 68  . Geographic tongue   . GERD (gastroesophageal reflux disease)    H/O NO MEDS  . Helicobacter pylori gastrointestinal tract infection   . Hyperlipidemia   . Incontinence   . Indigestion   . Prolapse of vaginal walls   . Right sided sciatica   . Tachycardia   . Urethral prolapse     Surgical History: Past Surgical History:  Procedure Laterality Date  . BREAST BIOPSY Left 10/11/2016   left breast stereo/ VASCULAR LESION  . BREAST BIOPSY Left 12/02/2016   Procedure: NEEDLE LOCALIZATION;  Surgeon: [name], MD;  Location: [facility] OR[facility];  Service: General;  Laterality: Left;  . BREAST EXCISIONAL BIOPSY Left 12/02/2016   left lumpectomy  . BREAST LUMPECTOMY Left 12/02/2016   Procedure: EXCISION BREAST MASS;  Surgeon: [name], MD;  Location: [facility] OR[facility];  Service: General;   Laterality: Left;  . DILATION AND CURETTAGE OF UTERUS    . dnc     1983  . fatty tumor removal Right    hand    Home Medications:  Allergies as of 05/14/2019      Reactions   Tussionex Pennkinetic Er [hydrocod Polst-cpm Polst Er] Other (See Comments)   Real dizzy headed even after a while still walking every direction but straight, patient stated she stopped taking it because of that.   Hydrocodone    Light headed and dizziness      Medication List       Accurate as of May 14, 2019  1:39 PM. If you have any questions, ask your nurse or doctor.        [name] PO Take 1 tablet by mouth daily.   OMEGA-3 PO Take 1 capsule by mouth 2 (two) times daily. Once to twice daily (pt sometimes forgets 2nd dose)   Premarin vaginal cream Generic drug: conjugated estrogens Apply pea size nightly   TURMERIC PO Take 1 capsule by mouth daily.   Vitamin D 50 MCG (2000 UT) tablet Take 2,000 Units by mouth daily. Reported on 06/04/2015       Allergies:  Allergies  Allergen Reactions  . Tussionex Pennkinetic Er [Hydrocod Polst-Cpm Polst Er] Other (See Comments)    Real dizzy headed even after a while still walking every direction but straight, patient stated she stopped taking it because  of that.  . Hydrocodone     Light headed and dizziness    Family History: Family History  Problem Relation Age of Onset  . Diabetes Mother   . Hypertension Mother   . Cardiomyopathy Sister   . Down syndrome Sister   . Leukemia Brother   . Alcohol abuse Son        youngest son  . Asthma Son        youngest son  . Rheum arthritis Sister   . Drug abuse Child 23  . Breast cancer Neg Hx   . Kidney cancer Neg Hx   . Bladder Cancer Neg Hx   . Prostate cancer Neg Hx     Social History:  reports that she has never smoked. She has never used smokeless tobacco. She reports that she does not drink alcohol or use drugs.  ROS: UROLOGY Frequent Urination?: Yes Hard to postpone  urination?: No Burning/pain with urination?: No Get up at night to urinate?: Yes Leakage of urine?: No Urine stream starts and stops?: No Trouble starting stream?: No Do you have to strain to urinate?: No Blood in urine?: No Urinary tract infection?: No Sexually transmitted disease?: No Injury to kidneys or bladder?: No Painful intercourse?: No Weak stream?: No Currently pregnant?: No Vaginal bleeding?: No Last menstrual period?: n  Gastrointestinal Nausea?: No Vomiting?: No Indigestion/heartburn?: No Diarrhea?: No Constipation?: No  Constitutional Fever: No Night sweats?: No Weight loss?: No Fatigue?: No  Skin Skin rash/lesions?: No Itching?: No  Eyes Blurred vision?: No Double vision?: No  Ears/Nose/Throat Sore throat?: No Sinus problems?: No  Hematologic/Lymphatic Swollen glands?: No Easy bruising?: No  Cardiovascular Leg swelling?: No Chest pain?: No  Respiratory Cough?: No Shortness of breath?: No  Endocrine Excessive thirst?: No  Musculoskeletal Back pain?: No Joint pain?: No  Neurological Headaches?: No Dizziness?: No  Psychologic Depression?: No Anxiety?: No  Physical Exam: BP (!) 150/66   Pulse 81   Ht 5\' 5"  (1.651 m)   Wt 150 lb (68 kg)   BMI 24.96 kg/m   Constitutional:  Well nourished. Alert and oriented, No acute distress. HEENT: [place] AT, mask in place.  Trachea midline, no masses. Cardiovascular: No clubbing, cyanosis, or edema. Respiratory: Normal respiratory effort, no increased work of breathing. GI: Abdomen is soft, non tender, non distended, no abdominal masses. Liver and spleen not palpable.  No hernias appreciated.  Stool sample for occult testing is not indicated.   GU: No CVA tenderness.  No bladder fullness or masses.  Atrophic external genitalia, sparse pubic hair distribution, no lesions.  Urethral caruncle noted.  No bladder fullness, tenderness or masses. pale vagina mucosa, fair estrogen effect, no discharge,  no lesions, poor pelvic support, grade II cystocele and grade I rectocele noted.  No cervical motion tenderness.  Uterus is freely mobile and non-fixed.  No adnexal/parametria masses or tenderness noted.  Anus and perineum are without rashes or lesions.    Skin: No rashes, bruises or suspicious lesions. Lymph: No inguinal adenopathy. Neurologic: Grossly intact, no focal deficits, moving all 4 extremities. Psychiatric: Normal mood and affect.   Laboratory Data: Lab Results  Component Value Date   WBC 3.3 (L) 06/13/2018   HGB 12.0 06/13/2018   HCT 35.5 06/13/2018   MCV 87.4 06/13/2018   PLT 196 06/13/2018    Lab Results  Component Value Date   CREATININE 0.80 08/03/2017    No results found for: PSA  No results found for: TESTOSTERONE  No results found for:  HGBA1C  Urinalysis No results found for: COLORURINE, APPEARANCEUR, LABSPEC, PHURINE, GLUCOSEU, HGBUR, BILIRUBINUR, KETONESUR, PROTEINUR, UROBILINOGEN, NITRITE, LEUKOCYTESUR  Pertinent Imaging:   Assessment & Plan:   1. Urge incontinence Not bothersome RTC in one year for OAB questionnaire and PVR  2. Pelvic prolapse  Deferred surgery  3. Vaginal atrophy Continue vaginal estrogen cream Monday, Wednesday and Friday nights RTC in one year for exam   Return in about 1 year (around 05/13/2020) for OAB questionnaire, PVR and exam.  Zara Council, [facility]  [place], [place] [address] [phone]

## 2019-06-06 ENCOUNTER — Ambulatory Visit: Payer: Medicare Other | Attending: Internal Medicine

## 2019-06-06 DIAGNOSIS — Z20822 Contact with and (suspected) exposure to covid-19: Secondary | ICD-10-CM | POA: Diagnosis not present

## 2019-06-07 LAB — NOVEL CORONAVIRUS, NAA: SARS-CoV-2, NAA: NOT DETECTED

## 2019-06-13 ENCOUNTER — Ambulatory Visit: Payer: Medicare Other | Admitting: Family Medicine

## 2019-07-11 ENCOUNTER — Ambulatory Visit (INDEPENDENT_AMBULATORY_CARE_PROVIDER_SITE_OTHER): Payer: Medicare Other | Admitting: Family Medicine

## 2019-07-11 ENCOUNTER — Encounter: Payer: Self-pay | Admitting: Family Medicine

## 2019-07-11 ENCOUNTER — Other Ambulatory Visit: Payer: Self-pay

## 2019-07-11 VITALS — BP 138/68 | HR 81 | Temp 97.5°F | Resp 16 | Ht 65.0 in | Wt 165.3 lb

## 2019-07-11 DIAGNOSIS — M1712 Unilateral primary osteoarthritis, left knee: Secondary | ICD-10-CM | POA: Diagnosis not present

## 2019-07-11 DIAGNOSIS — Z1231 Encounter for screening mammogram for malignant neoplasm of breast: Secondary | ICD-10-CM

## 2019-07-11 DIAGNOSIS — D708 Other neutropenia: Secondary | ICD-10-CM | POA: Diagnosis not present

## 2019-07-11 DIAGNOSIS — N8189 Other female genital prolapse: Secondary | ICD-10-CM | POA: Diagnosis not present

## 2019-07-11 DIAGNOSIS — Z86718 Personal history of other venous thrombosis and embolism: Secondary | ICD-10-CM

## 2019-07-11 LAB — CBC WITH DIFFERENTIAL/PLATELET
Absolute Monocytes: 429 cells/uL (ref 200–950)
Basophils Absolute: 31 cells/uL (ref 0–200)
Basophils Relative: 0.8 %
Eosinophils Absolute: 101 cells/uL (ref 15–500)
Eosinophils Relative: 2.6 %
HCT: 37 % (ref 35.0–45.0)
Hemoglobin: 12.2 g/dL (ref 11.7–15.5)
Lymphs Abs: 1490 cells/uL (ref 850–3900)
MCH: 29.7 pg (ref 27.0–33.0)
MCHC: 33 g/dL (ref 32.0–36.0)
MCV: 90 fL (ref 80.0–100.0)
MPV: 12.5 fL (ref 7.5–12.5)
Monocytes Relative: 11 %
Neutro Abs: 1849 cells/uL (ref 1500–7800)
Neutrophils Relative %: 47.4 %
Platelets: 197 10*3/uL (ref 140–400)
RBC: 4.11 10*6/uL (ref 3.80–5.10)
RDW: 13 % (ref 11.0–15.0)
Total Lymphocyte: 38.2 %
WBC: 3.9 10*3/uL (ref 3.8–10.8)

## 2019-07-11 LAB — COMPLETE METABOLIC PANEL WITH GFR
AG Ratio: 1.1 (calc) (ref 1.0–2.5)
ALT: 16 U/L (ref 6–29)
AST: 23 U/L (ref 10–35)
Albumin: 4.1 g/dL (ref 3.6–5.1)
Alkaline phosphatase (APISO): 88 U/L (ref 37–153)
BUN: 9 mg/dL (ref 7–25)
CO2: 31 mmol/L (ref 20–32)
Calcium: 10 mg/dL (ref 8.6–10.4)
Chloride: 103 mmol/L (ref 98–110)
Creat: 0.87 mg/dL (ref 0.60–0.93)
GFR, Est African American: 74 mL/min/{1.73_m2} (ref >=60)
GFR, Est Non African American: 64 mL/min/{1.73_m2} (ref >=60)
Globulin: 3.8 g/dL (calc) — ABNORMAL HIGH (ref 1.9–3.7)
Glucose, Bld: 92 mg/dL (ref 65–99)
Potassium: 4 mmol/L (ref 3.5–5.3)
Sodium: 140 mmol/L (ref 135–146)
Total Bilirubin: 0.3 mg/dL (ref 0.2–1.2)
Total Protein: 7.9 g/dL (ref 6.1–8.1)

## 2019-07-11 NOTE — Progress Notes (Signed)
Name: Meredith Wilcox   MRN: [place]:[id number]    DOB: 30-Apr-1940   Date:07/11/2019       Progress Note  Subjective  Chief Complaint  Chief Complaint  Patient presents with  . DVT    HPI  Leucopenia: she has a long history of leucopenia but stable, she also has a history of iron deficiency anemia, we will recheck CBC  Cystocele : does not like pessary, seen by Urologist last visit 05/2019 she is using estrogen cream three times a week, Dr. Matilde Sprang discussed hysterectomy and cystocele repair. She states she is not ready for surgery at this time. She states symptoms slightly better   History of DVT: Spring of 2019, resolved on repeat doppler US 10/2017. No problems since. She denies leg pain, swelling or SOB. Unchanged   Discussed importance of mammogram and bone density but she is worried about radiation, discussed low radiation exposure. Last mammogram was done in 2018, she had extra views and supposed to go back but lost to follow up with Dr. Jamal Collin, she states she will consider getting mammogram done, but continues to refused bone density    Patient Active Problem List   Diagnosis Date Noted  . Vaginal discharge 01/27/2017  . 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 Cystocele, midline 12/21/2016  . Vaginal atrophy 12/21/2016  . 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 History of Helicobacter pylori infection 11/16/2014  . 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 Incontinence 11/16/2014  . 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 BREAST BIOPSY Left 10/11/2016   left breast stereo/ VASCULAR LESION  . BREAST BIOPSY Left 12/02/2016   Procedure: NEEDLE LOCALIZATION;  Surgeon: [name], MD;  Location: [facility] ORS;  Service: General;  Laterality: Left;  . BREAST EXCISIONAL BIOPSY Left 12/02/2016   left lumpectomy  . BREAST LUMPECTOMY Left 12/02/2016   Procedure: EXCISION  BREAST MASS;  Surgeon: [name], MD;  Location: [facility] ORS;  Service: General;  Laterality: Left;  . DILATION AND CURETTAGE OF UTERUS    . dnc     1983  . fatty tumor removal Right    hand    Family History  Problem Relation Age of Onset  . Diabetes Mother   . Hypertension Mother   . Cardiomyopathy Sister   . Down syndrome Sister   . Leukemia Brother   . Alcohol abuse Son        youngest son  . Asthma Son        youngest son  . Rheum arthritis Sister   . Drug abuse Child 23  . Breast cancer Neg Hx   . Kidney cancer Neg Hx   . Bladder Cancer Neg Hx   . Prostate cancer Neg Hx     Social History   Tobacco Use  . Smoking status: Never Smoker  . Smokeless tobacco: Never Used  . Tobacco comment: smoking cessation materials not required  Substance Use Topics  . Alcohol use: No    Alcohol/week: 0.0 standard drinks     Current Outpatient Medications:  .  Cholecalciferol (VITAMIN D) 2000 UNITS tablet, Take 2,000 Units by mouth daily. Reported on 06/04/2015, Disp: , Rfl:  .  conjugated estrogens (PREMARIN) vaginal cream, Apply pea size nightly, Disp: 30 g, Rfl: 12 .  Multiple Vitamins-Minerals ([name] PO), Take 1 tablet by mouth daily. , Disp: , Rfl:  .  Omega-3 Fatty Acids (  OMEGA-3 PO), Take 1 capsule by mouth 2 (two) times daily. Once to twice daily (pt sometimes forgets 2nd dose), Disp: , Rfl:  .  TURMERIC PO, Take 1 capsule by mouth daily. , Disp: , Rfl:   Allergies  Allergen Reactions  . Tussionex Pennkinetic Er [Hydrocod Polst-Cpm Polst Er] Other (See Comments)    Real dizzy headed even after a while still walking every direction but straight, patient stated she stopped taking it because of that.  . Hydrocodone     Light headed and dizziness    I personally reviewed active problem list, medication list, allergies, family history, social history, health maintenance with the patient/caregiver today.   ROS  Constitutional: Negative for fever or  weight change.  Respiratory: Negative for cough and shortness of breath.   Cardiovascular: Negative for chest pain or palpitations.  Gastrointestinal: Negative for abdominal pain, no bowel changes.  Musculoskeletal: Negative for gait problem or joint swelling.  Skin: Negative for rash - but she has hyperpigmentation on her leg  Neurological: Negative for dizziness or headache.  No other specific complaints in a complete review of systems (except as listed in HPI above).  Objective  Vitals:   07/11/19 1009 07/11/19 1026  BP: (!) 160/60 138/68  Pulse: 81   Resp: 16   Temp: (!) 97.5 F (36.4 C)   TempSrc: Temporal   SpO2: 99%   Weight: 165 lb 4.8 oz (75 kg)   Height: 5\' 5"  (1.651 m)     Body mass index is 27.51 kg/m.  Physical Exam  Constitutional: Patient appears well-developed and well-nourished. Overweight. No distress.  HEENT: head atraumatic, normocephalic, pupils equal and reactive to light Cardiovascular: Normal rate, regular rhythm and normal heart sounds.  No murmur heard. No BLE edema. Pulmonary/Chest: Effort normal and breath sounds normal. No respiratory distress. Abdominal: Soft.  There is no tenderness. Psychiatric: Patient has a normal mood and affect. behavior is normal. Judgment and thought content normal.  Recent Results (from the past 2160 hour(s))  Bladder Scan (Post Void Residual) in office     Status: None   Collection Time: 05/14/19 11:46 AM  Result Value Ref Range   Scan Result 63   Novel Coronavirus, NAA ([facility])     Status: None   Collection Time: 06/06/19  2:07 PM   Specimen: Oropharyngeal(OP) collection in vial transport medium   OROPHARYNGEA  TESTING  Result Value Ref Range   SARS-CoV-2, NAA Not Detected Not Detected    Comment: This nucleic acid amplification test was developed and its performance characteristics determined by [employer]. Nucleic acid amplification tests include RT-PCR and TMA. This test has not been FDA cleared  or approved. This test has been authorized by FDA under an Emergency Use Authorization (EUA). This test is only authorized for the duration of time the declaration that circumstances exist justifying the authorization of the emergency use of in vitro diagnostic tests for detection of SARS-CoV-2 virus and/or diagnosis of COVID-19 infection under section 564(b)(1) of the Act, 21 U.S.C. [id number]) (1), unless the authorization is terminated or revoked sooner. When diagnostic testing is negative, the possibility of a false negative result should be considered in the context of a patient's recent exposures and the presence of clinical signs and symptoms consistent with COVID-19. An individual without symptoms of COVID-19 and who is not shedding SARS-CoV-2 virus wo uld expect to have a negative (not detected) result in this assay.       PHQ2/9: Depression screen [facility] 2/9 07/11/2019 12/14/2018  11/07/2018 06/13/2018 11/01/2017  Decreased Interest 0 0 0 0 0  Down, Depressed, Hopeless 0 0 0 0 0  PHQ - 2 Score 0 0 0 0 0  Altered sleeping 0 0 - 0 0  Tired, decreased energy 0 0 - 0 0  Change in appetite 0 0 - 0 0  Feeling bad or failure about yourself  0 0 - 0 0  Trouble concentrating 0 0 - 0 0  Moving slowly or fidgety/restless 0 0 - 0 0  Suicidal thoughts 0 0 - 0 0  PHQ-9 Score 0 0 - 0 0  Difficult doing work/chores - Not difficult at all - - Not difficult at all    phq 9 is negative   Fall Risk: Fall Risk  07/11/2019 12/14/2018 11/07/2018 06/13/2018 11/01/2017  Falls in the past year? 0 0 0 0 No  Number falls in past yr: 0 0 0 0 -  Injury with Fall? 0 0 0 0 -  Risk for fall due to : - - - - Impaired vision  Risk for fall due to: Comment - - - - wears eyeglasses  Follow up - - Falls prevention discussed Falls evaluation completed -     Functional Status Survey: Is the patient deaf or have difficulty hearing?: No Does the patient have difficulty seeing, even when wearing glasses/contacts?:  No Does the patient have difficulty concentrating, remembering, or making decisions?: No Does the patient have difficulty walking or climbing stairs?: No Does the patient have difficulty dressing or bathing?: No Does the patient have difficulty doing errands alone such as visiting a doctor's office or shopping?: No    Assessment & Plan  1. Other neutropenia ([place])  - CBC with Differential/Platelet - COMPLETE METABOLIC PANEL WITH GFR  2. Pelvic floor relaxation  She had PT and is doing better  3. History of DVT (deep vein thrombosis)  - COMPLETE METABOLIC PANEL WITH GFR  4. Primary osteoarthritis of left knee  - COMPLETE METABOLIC PANEL WITH GFR  5. Encounter for screening mammogram for breast cancer  - MM 3D SCREEN BREAST BILATERAL; Future

## 2019-11-08 ENCOUNTER — Ambulatory Visit (INDEPENDENT_AMBULATORY_CARE_PROVIDER_SITE_OTHER): Payer: Medicare Other

## 2019-11-08 DIAGNOSIS — Z0000 Encounter for general adult medical examination without abnormal findings: Secondary | ICD-10-CM

## 2019-11-08 NOTE — Progress Notes (Signed)
Subjective:   Meredith Wilcox is a 79 y.o. female who presents for Medicare Annual (Subsequent) preventive examination.  Virtual Visit via Telephone Note  I connected with  Meredith Wilcox on 11/08/19 at 10:00 AM EDT by telephone and verified that I am speaking with the correct person using two identifiers.  Medicare Annual Wellness visit completed telephonically due to Covid-19 pandemic.   Location: Patient: home Provider: office   I discussed the limitations, risks, security and privacy concerns of performing an evaluation and management service by telephone and the availability of in person appointments. The patient expressed understanding and agreed to proceed.  Unable to perform video visit due to video visit attempted and failed and/or patient does not have video capability.   Some vital signs may be absent or patient reported.   Clemetine Marker, LPN    Review of Systems     Cardiac Risk Factors include: advanced age (>55men, >28 women);dyslipidemia;hypertension     Objective:    There were no vitals filed for this visit. There is no height or weight on file to calculate BMI.  Advanced Directives 11/08/2019 11/07/2018 11/01/2017 12/02/2016 11/18/2016 07/28/2016 06/17/2016  Does Patient Have a Medical Advance Directive? No No No Yes Yes Yes Yes  Type of Advance Directive - - Public librarian;Living will - Living will [place];Living will  Does patient want to make changes to medical advance directive? - - - No - Patient declined - - -  Copy of [place] in Chart? - - - No - copy requested - - -  Would patient like information on creating a medical advance directive? Yes (MAU/Ambulatory/Procedural Areas - Information given) Yes (MAU/Ambulatory/Procedural Areas - Information given) Yes (MAU/Ambulatory/Procedural Areas - Information given) - - - -    Current Medications (verified) Outpatient Encounter Medications as of 11/08/2019    Medication Sig  . Ascorbic Acid (VITAMIN C) 1000 MG tablet Take 1,000 mg by mouth daily.  . Cholecalciferol (VITAMIN D) 2000 UNITS tablet Take 2,000 Units by mouth daily. Reported on 06/04/2015  . conjugated estrogens (PREMARIN) vaginal cream Apply pea size nightly  . Multiple Vitamins-Minerals ([name] PO) Take 1 tablet by mouth daily.   . Omega-3 Fatty Acids (OMEGA-3 PO) Take 1 capsule by mouth 2 (two) times daily. Once to twice daily (pt sometimes forgets 2nd dose)  . TURMERIC PO Take 1 capsule by mouth daily.    No facility-administered encounter medications on file as of 11/08/2019.    Allergies (verified) Tussionex pennkinetic er [hydrocod polst-cpm polst er] and Hydrocodone   History: Past Medical History:  Diagnosis Date  . Anemia    H/O DURING PREGNANCY  . Arthritis   . Chronic kidney disease    KIDNEY PROBLEMS AROUND AGE 35  . Geographic tongue   . GERD (gastroesophageal reflux disease)    H/O NO MEDS  . Helicobacter pylori gastrointestinal tract infection   . Hyperlipidemia   . Incontinence   . Indigestion   . Prolapse of vaginal walls   . Right sided sciatica   . Tachycardia   . Urethral prolapse    Past Surgical History:  Procedure Laterality Date  . BREAST BIOPSY Left 10/11/2016   left breast stereo/ VASCULAR LESION  . BREAST BIOPSY Left 12/02/2016   Procedure: NEEDLE LOCALIZATION;  Surgeon: [name], MD;  Location: [facility] ORS;  Service: General;  Laterality: Left;  . BREAST EXCISIONAL BIOPSY Left 12/02/2016   left lumpectomy  . BREAST  LUMPECTOMY Left 12/02/2016   Procedure: EXCISION BREAST MASS;  Surgeon: [name], MD;  Location: [facility] ORS;  Service: General;  Laterality: Left;  . DILATION AND CURETTAGE OF UTERUS    . dnc     1983  . fatty tumor removal Right    hand   Family History  Problem Relation Age of Onset  . Diabetes Mother   . Hypertension Mother   . Cardiomyopathy Sister   . Down syndrome Sister   .  Leukemia Brother   . Alcohol abuse Son        youngest son  . Asthma Son        youngest son  . Rheum arthritis Sister   . Drug abuse Child 23  . Breast cancer Neg Hx   . Kidney cancer Neg Hx   . Bladder Cancer Neg Hx   . Prostate cancer Neg Hx    Social History   Socioeconomic History  . Marital status: Married    Spouse name: Meredith Wilcox  . Number of children: 4  . Years of education: some college  . Highest education level: 12th grade  Occupational History  . Occupation: Retired  Tobacco Use  . Smoking status: Never Smoker  . Smokeless tobacco: Never Used  . Tobacco comment: smoking cessation materials not required  Vaping Use  . Vaping Use: Never used  Substance and Sexual Activity  . Alcohol use: No    Alcohol/week: 0.0 standard drinks  . Drug use: No  . Sexual activity: Not Currently    Birth control/protection: Post-menopausal  Other Topics Concern  . Not on file  Social History Narrative  . Not on file   Social Determinants of Health   Financial Resource Strain: Low Risk   . Difficulty of Paying Living Expenses: Not hard at all  Food Insecurity: No Food Insecurity  . Worried About Charity fundraiser in the Last Year: Never true  . Ran Out of Food in the Last Year: Never true  Transportation Needs: No Transportation Needs  . Lack of Transportation (Medical): No  . Lack of Transportation (Non-Medical): No  Physical Activity: Sufficiently Active  . Days of Exercise per Week: 7 days  . Minutes of Exercise per Session: 30 min  Stress: No Stress Concern Present  . Feeling of Stress : Not at all  Social Connections: Moderately Integrated  . Frequency of Communication with Friends and Family: More than three times a week  . Frequency of Social Gatherings with Friends and Family: Twice a week  . Attends Religious Services: More than 4 times per year  . Active Member of Clubs or Organizations: No  . Attends Archivist Meetings: Never  . Marital Status:  Married    Tobacco Counseling Counseling given: Not Answered Comment: smoking cessation materials not required   Clinical Intake:  Pre-visit preparation completed: Yes  Pain : No/denies pain     Nutritional Risks: None Diabetes: No  How often do you need to have someone help you when you read instructions, pamphlets, or other written materials from your doctor or pharmacy?: 1 - Never    Interpreter Needed?: No  Information entered by :: Clemetine Marker LPN   Activities of Daily Living In your present state of health, do you have any difficulty performing the following activities: 11/08/2019 07/11/2019  Hearing? N N  Comment declines hearing aids -  Vision? N N  Difficulty concentrating or making decisions? N N  Walking or climbing stairs? N  N  Dressing or bathing? N N  Doing errands, shopping? N N  Preparing Food and eating ? N -  Using the Toilet? N -  In the past six months, have you accidently leaked urine? N -  Do you have problems with loss of bowel control? N -  Managing your Medications? N -  Managing your Finances? N -  Housekeeping or managing your Housekeeping? N -  Some recent data might be hidden    Patient Care Team: [name], MD as PCP - General (Family Medicine) Birder Robson, MD as Consulting Physician (Ophthalmology)  Indicate any recent Medical Services you may have received from other than [facility] providers in the past year (date may be approximate).     Assessment:   This is a routine wellness examination for [facility].  Hearing/Vision screen  Hearing Screening   125Hz  250Hz  500Hz  1000Hz  2000Hz  3000Hz  4000Hz  6000Hz  8000Hz   Right ear:           Left ear:           Comments: Pt denies hearing difficulty  Vision Screening Comments: Annual vision screenings done at [facility]  Dietary issues and exercise activities discussed: Current Exercise Habits: Home exercise routine, Type of exercise: calisthenics;strength  training/weights;stretching, Time (Minutes): 30, Frequency (Times/Week): 7, Weekly Exercise (Minutes/Week): 210, Intensity: Moderate, Exercise limited by: None identified  Goals    . DIET - INCREASE WATER INTAKE     Recommend to drink at least 6-8 8oz glasses of water per day.      Depression Screen PHQ 2/9 Scores 11/08/2019 07/11/2019 12/14/2018 11/07/2018 06/13/2018 11/01/2017 07/28/2017  PHQ - 2 Score 0 0 0 0 0 0 0  PHQ- 9 Score - 0 0 - 0 0 -    Fall Risk Fall Risk  11/08/2019 07/11/2019 12/14/2018 11/07/2018 06/13/2018  Falls in the past year? 0 0 0 0 0  Number falls in past yr: 0 0 0 0 0  Injury with Fall? 0 0 0 0 0  Risk for fall due to : No Fall Risks - - - -  Risk for fall due to: Comment - - - - -  Follow up Falls prevention discussed - - Falls prevention discussed Falls evaluation completed    Any stairs in or around the home? Yes  If so, are there any without handrails? No  Home free of loose throw rugs in walkways, pet beds, electrical cords, etc? Yes  Adequate lighting in your home to reduce risk of falls? Yes   ASSISTIVE DEVICES UTILIZED TO PREVENT FALLS:  Life alert? No  Use of a cane, walker or w/c? No  Grab bars in the bathroom? No Shower chair or bench in shower? Yes  Elevated toilet seat or a handicapped toilet? No   TIMED UP AND GO:  Was the test performed? No . Telephonic visit.  Cognitive Function:     6CIT Screen 11/08/2019 11/07/2018 11/01/2017  What Year? 0 points 0 points 0 points  What month? 0 points 0 points 0 points  What time? 0 points 0 points 3 points  Count back from 20 0 points 0 points 0 points  Months in reverse 0 points 0 points 0 points  Repeat phrase 0 points 2 points 4 points  Total Score 0 2 7    Immunizations Immunization History  Administered Date(s) Administered  . Tdap 01/24/2019    TDAP status: Up to date   Flu Vaccine status: Declined, Education has been provided regarding the  importance of this vaccine but patient still  declined. Advised may receive this vaccine at local pharmacy or Health Dept. Aware to provide a copy of the vaccination record if obtained from local pharmacy or Health Dept. Verbalized acceptance and understanding.   Pneumococcal vaccine status: Declined,  Education has been provided regarding the importance of this vaccine but patient still declined. Advised may receive this vaccine at local pharmacy or Health Dept. Aware to provide a copy of the vaccination record if obtained from local pharmacy or Health Dept. Verbalized acceptance and understanding.    Covid-19 vaccine status: Completed vaccines  Qualifies for Shingles Vaccine? Yes   Zostavax completed No   Shingrix Completed?: No.    Education has been provided regarding the importance of this vaccine. Patient has been advised to call insurance company to determine out of pocket expense if they have not yet received this vaccine. Advised may also receive vaccine at local pharmacy or Health Dept. Verbalized acceptance and understanding.  Screening Tests Health Maintenance  Topic Date Due  . Hepatitis C Screening  Never done  . MAMMOGRAM  09/01/2017  . COVID-19 Vaccine (1) 11/24/2019 (Originally 01/16/1953)  . DEXA SCAN  07/10/2020 (Originally 01/17/2007)  . INFLUENZA VACCINE  03/02/2029 (Originally 11/11/2019)  . PNA vac Low Risk Adult (1 of 2 - PCV13) 03/02/2029 (Originally 01/17/2007)  . TETANUS/TDAP  01/23/2029    Health Maintenance  Health Maintenance Due  Topic Date Due  . Hepatitis C Screening  Never done  . MAMMOGRAM  09/01/2017    Colorectal cancer screening: No longer required.    Mammogram status: Completed 09/01/16. Repeat every year. Ordered 07/11/19.  Bone density screening status: pt declined  Lung Cancer Screening: (Low Dose CT Chest recommended if Age 55-80 years, 30 pack-year currently smoking OR have quit w/in 15years.) does not qualify.   Additional Screening:  Hepatitis C Screening: does qualify;  postponed  Vision Screening: Recommended annual ophthalmology exams for early detection of glaucoma and other disorders of the eye. Is the patient up to date with their annual eye exam?  Yes  Who is the provider or what is the name of the office in which the patient attends annual eye exams? [place] Screening: Recommended annual dental exams for proper oral hygiene  Community Resource Referral / Chronic Care Management: CRR required this visit?  No   CCM required this visit?  No      Plan:     I have personally reviewed and noted the following in the patient's chart:   . Medical and social history . Use of alcohol, tobacco or illicit drugs  . Current medications and supplements . Functional ability and status . Nutritional status . Physical activity . Advanced directives . List of other physicians . Hospitalizations, surgeries, and ER visits in previous 12 months . Vitals . Screenings to include cognitive, depression, and falls . Referrals and appointments  In addition, I have reviewed and discussed with patient certain preventive protocols, quality metrics, and best practice recommendations. A written personalized care plan for preventive services as well as general preventive health recommendations were provided to patient.     Clemetine Marker, LPN   07/22/8784   Nurse Notes: pt doing well and appreciative of visit today

## 2019-11-08 NOTE — Patient Instructions (Signed)
Meredith Wilcox , Thank you for taking time to come for your Medicare Wellness Visit. I appreciate your ongoing commitment to your health goals. Please review the following plan we discussed and let me know if I can assist you in the future.   Screening recommendations/referrals: Colonoscopy: no longer required Mammogram: done 09/01/16 Bone Density: postponed Recommended yearly ophthalmology/optometry visit for glaucoma screening and checkup Recommended yearly dental visit for hygiene and checkup  Vaccinations: Influenza vaccine: declined  Pneumococcal vaccine: declined Tdap vaccine: done 01/24/19 Shingles vaccine: Shingrix discussed. Please contact your pharmacy for coverage information.  Covid-19: discussed  Advanced directives: Advance directive discussed with you today. I have provided a copy for you to complete at home and have notarized. Once this is complete please bring a copy in to our office so we can scan it into your chart.  Conditions/risks identified: Keep up the great work!  Next appointment: Follow up in one year for your annual wellness visit    Preventive Care 65 Years and Older, Female Preventive care refers to lifestyle choices and visits with your health care provider that can promote health and wellness. What does preventive care include?  A yearly physical exam. This is also called an annual well check.  Dental exams once or twice a year.  Routine eye exams. Ask your health care provider how often you should have your eyes checked.  Personal lifestyle choices, including:  Daily care of your teeth and gums.  Regular physical activity.  Eating a healthy diet.  Avoiding tobacco and drug use.  Limiting alcohol use.  Practicing safe sex.  Taking low-dose aspirin every day.  Taking vitamin and mineral supplements as recommended by your health care provider. What happens during an annual well check? The services and screenings done by your health care  provider during your annual well check will depend on your age, overall health, lifestyle risk factors, and family history of disease. Counseling  Your health care provider may ask you questions about your:  Alcohol use.  Tobacco use.  Drug use.  Emotional well-being.  Home and relationship well-being.  Sexual activity.  Eating habits.  History of falls.  Memory and ability to understand (cognition).  Work and work Statistician.  Reproductive health. Screening  You may have the following tests or measurements:  Height, weight, and BMI.  Blood pressure.  Lipid and cholesterol levels. These may be checked every 5 years, or more frequently if you are over 74 years old.  Skin check.  Lung cancer screening. You may have this screening every year starting at age 46 if you have a 30-pack-year history of smoking and currently smoke or have quit within the past 15 years.  Fecal occult blood test (FOBT) of the stool. You may have this test every year starting at age 61.  Flexible sigmoidoscopy or colonoscopy. You may have a sigmoidoscopy every 5 years or a colonoscopy every 10 years starting at age 4.  Hepatitis C blood test.  Hepatitis B blood test.  Sexually transmitted disease (STD) testing.  Diabetes screening. This is done by checking your blood sugar (glucose) after you have not eaten for a while (fasting). You may have this done every 1-3 years.  Bone density scan. This is done to screen for osteoporosis. You may have this done starting at age 35.  Mammogram. This may be done every 1-2 years. Talk to your health care provider about how often you should have regular mammograms. Talk with your health care provider about your  test results, treatment options, and if necessary, the need for more tests. Vaccines  Your health care provider may recommend certain vaccines, such as:  Influenza vaccine. This is recommended every year.  Tetanus, diphtheria, and acellular  pertussis (Tdap, Td) vaccine. You may need a Td booster every 10 years.  Zoster vaccine. You may need this after age 27.  Pneumococcal 13-valent conjugate (PCV13) vaccine. One dose is recommended after age 37.  Pneumococcal polysaccharide (PPSV23) vaccine. One dose is recommended after age 23. Talk to your health care provider about which screenings and vaccines you need and how often you need them. This information is not intended to replace advice given to you by your health care provider. Make sure you discuss any questions you have with your health care provider. Document Released: 04/25/2015 Document Revised: 12/17/2015 Document Reviewed: 01/28/2015 Elsevier Interactive Patient Education  2017 [place] Prevention in the Home Falls can cause injuries. They can happen to people of all ages. There are many things you can do to make your home safe and to help prevent falls. What can I do on the outside of my home?  Regularly fix the edges of walkways and driveways and fix any cracks.  Remove anything that might make you trip as you walk through a door, such as a raised step or threshold.  Trim any bushes or trees on the path to your home.  Use bright outdoor lighting.  Clear any walking paths of anything that might make someone trip, such as rocks or tools.  Regularly check to see if handrails are loose or broken. Make sure that both sides of any steps have handrails.  Any raised decks and porches should have guardrails on the edges.  Have any leaves, snow, or ice cleared regularly.  Use sand or salt on walking paths during winter.  Clean up any spills in your garage right away. This includes oil or grease spills. What can I do in the bathroom?  Use night lights.  Install grab bars by the toilet and in the tub and shower. Do not use towel bars as grab bars.  Use non-skid mats or decals in the tub or shower.  If you need to sit down in the shower, use a plastic,  non-slip stool.  Keep the floor dry. Clean up any water that spills on the floor as soon as it happens.  Remove soap buildup in the tub or shower regularly.  Attach bath mats securely with double-sided non-slip rug tape.  Do not have throw rugs and other things on the floor that can make you trip. What can I do in the bedroom?  Use night lights.  Make sure that you have a light by your bed that is easy to reach.  Do not use any sheets or blankets that are too big for your bed. They should not hang down onto the floor.  Have a firm chair that has side arms. You can use this for support while you get dressed.  Do not have throw rugs and other things on the floor that can make you trip. What can I do in the kitchen?  Clean up any spills right away.  Avoid walking on wet floors.  Keep items that you use a lot in easy-to-reach places.  If you need to reach something above you, use a strong step stool that has a grab bar.  Keep electrical cords out of the way.  Do not use floor polish or wax that  makes floors slippery. If you must use wax, use non-skid floor wax.  Do not have throw rugs and other things on the floor that can make you trip. What can I do with my stairs?  Do not leave any items on the stairs.  Make sure that there are handrails on both sides of the stairs and use them. Fix handrails that are broken or loose. Make sure that handrails are as long as the stairways.  Check any carpeting to make sure that it is firmly attached to the stairs. Fix any carpet that is loose or worn.  Avoid having throw rugs at the top or bottom of the stairs. If you do have throw rugs, attach them to the floor with carpet tape.  Make sure that you have a light switch at the top of the stairs and the bottom of the stairs. If you do not have them, ask someone to add them for you. What else can I do to help prevent falls?  Wear shoes that:  Do not have high heels.  Have rubber  bottoms.  Are comfortable and fit you well.  Are closed at the toe. Do not wear sandals.  If you use a stepladder:  Make sure that it is fully opened. Do not climb a closed stepladder.  Make sure that both sides of the stepladder are locked into place.  Ask someone to hold it for you, if possible.  Clearly mark and make sure that you can see:  Any grab bars or handrails.  First and last steps.  Where the edge of each step is.  Use tools that help you move around (mobility aids) if they are needed. These include:  Canes.  Walkers.  Scooters.  Crutches.  Turn on the lights when you go into a dark area. Replace any light bulbs as soon as they burn out.  Set up your furniture so you have a clear path. Avoid moving your furniture around.  If any of your floors are uneven, fix them.  If there are any pets around you, be aware of where they are.  Review your medicines with your doctor. Some medicines can make you feel dizzy. This can increase your chance of falling. Ask your doctor what other things that you can do to help prevent falls. This information is not intended to replace advice given to you by your health care provider. Make sure you discuss any questions you have with your health care provider. Document Released: 01/23/2009 Document Revised: 09/04/2015 Document Reviewed: 05/03/2014 Elsevier Interactive Patient Education  2017 [employer].

## 2020-05-16 ENCOUNTER — Other Ambulatory Visit: Payer: Self-pay | Admitting: Urology

## 2020-05-16 DIAGNOSIS — N3941 Urge incontinence: Secondary | ICD-10-CM

## 2020-05-16 NOTE — Progress Notes (Deleted)
05/19/2020 8:59 AM   Meredith Wilcox Dec 02, 1940 [id number]  Referring provider: [name], MD [address] [place],  [place] [address]  No chief complaint on file.  Urological history: 1. Urge incontinence - managed conservatively  2. Vaginal atrophy - managed with vaginal estrogen cream three nights weekly  3. Pelvic floor prolapse - managed conservatively  HPI: Meredith Wilcox is a 81 y.o. female who presents today for a yearly follow up.   PMH: Past Medical History:  Diagnosis Date  . Anemia    H/O DURING PREGNANCY  . Arthritis   . Chronic kidney disease    KIDNEY PROBLEMS AROUND AGE 23  . Geographic tongue   . GERD (gastroesophageal reflux disease)    H/O NO MEDS  . Helicobacter pylori gastrointestinal tract infection   . Hyperlipidemia   . Incontinence   . Indigestion   . Prolapse of vaginal walls   . Right sided sciatica   . Tachycardia   . Urethral prolapse     Surgical History: Past Surgical History:  Procedure Laterality Date  . BREAST BIOPSY Left 10/11/2016   left breast stereo/ VASCULAR LESION  . BREAST BIOPSY Left 12/02/2016   Procedure: NEEDLE LOCALIZATION;  Surgeon: [name], MD;  Location: [facility] ORS;  Service: General;  Laterality: Left;  . BREAST EXCISIONAL BIOPSY Left 12/02/2016   left lumpectomy  . BREAST LUMPECTOMY Left 12/02/2016   Procedure: EXCISION BREAST MASS;  Surgeon: [name], MD;  Location: [facility] ORS;  Service: General;  Laterality: Left;  . DILATION AND CURETTAGE OF UTERUS    . dnc     1983  . fatty tumor removal Right    hand    Home Medications:  Allergies as of 05/19/2020      Reactions   Tussionex Pennkinetic Er [hydrocod Polst-cpm Polst Er] Other (See Comments)   Real dizzy headed even after a while still walking every direction but straight, patient stated she stopped taking it because of that.   Hydrocodone    Light headed and dizziness      Medication List       Accurate as  of May 16, 2020  8:59 AM. If you have any questions, ask your nurse or doctor.        [name] PO Take 1 tablet by mouth daily.   OMEGA-3 PO Take 1 capsule by mouth 2 (two) times daily. Once to twice daily (pt sometimes forgets 2nd dose)   Premarin vaginal cream Generic drug: conjugated estrogens Apply pea size nightly   TURMERIC PO Take 1 capsule by mouth daily.   vitamin C 1000 MG tablet Take 1,000 mg by mouth daily.   Vitamin D 50 MCG (2000 UT) tablet Take 2,000 Units by mouth daily. Reported on 06/04/2015       Allergies:  Allergies  Allergen Reactions  . Tussionex Pennkinetic Er [Hydrocod Polst-Cpm Polst Er] Other (See Comments)    Real dizzy headed even after a while still walking every direction but straight, patient stated she stopped taking it because of that.  . Hydrocodone     Light headed and dizziness    Family History: Family History  Problem Relation Age of Onset  . Diabetes Mother   . Hypertension Mother   . Cardiomyopathy Sister   . Down syndrome Sister   . Leukemia Brother   . Alcohol abuse Son        youngest son  . Asthma Son  youngest son  . Rheum arthritis Sister   . Drug abuse Child 23  . Breast cancer Neg Hx   . Kidney cancer Neg Hx   . Bladder Cancer Neg Hx   . Prostate cancer Neg Hx     Social History:  reports that she has never smoked. She has never used smokeless tobacco. She reports that she does not drink alcohol and does not use drugs.  For pertinent review of systems please refer to history of present illness  Physical Exam: There were no vitals taken for this visit.  Constitutional:  Well nourished. Alert and oriented, No acute distress. HEENT: [place] AT, moist mucus membranes.  Trachea midline, no masses. Cardiovascular: No clubbing, cyanosis, or edema. Respiratory: Normal respiratory effort, no increased work of breathing. GI: Abdomen is soft, non tender, non distended, no abdominal masses. Liver and  spleen not palpable.  No hernias appreciated.  Stool sample for occult testing is not indicated.   GU: No CVA tenderness.  No bladder fullness or masses.  *** external genitalia, *** pubic hair distribution, no lesions.  Normal urethral meatus, no lesions, no prolapse, no discharge.   No urethral masses, tenderness and/or tenderness. No bladder fullness, tenderness or masses. *** vagina mucosa, *** estrogen effect, no discharge, no lesions, *** pelvic support, *** cystocele and *** rectocele noted.  No cervical motion tenderness.  Uterus is freely mobile and non-fixed.  No adnexal/parametria masses or tenderness noted.  Anus and perineum are without rashes or lesions.   ***  Skin: No rashes, bruises or suspicious lesions. Lymph: No cervical or inguinal adenopathy. Neurologic: Grossly intact, no focal deficits, moving all 4 extremities. Psychiatric: Normal mood and affect.   Laboratory Data: Component     Latest Ref Rng & Units 07/11/2019  Glucose     65 - 99 mg/dL 92  BUN     7 - 25 mg/dL 9  Creatinine     0.60 - 0.93 mg/dL 0.87  GFR, Est Non African American     > OR = 60 mL/min/1.32m2 64  GFR, Est African American     > OR = 60 mL/min/1.79m2 74  BUN/Creatinine Ratio     6 - 22 (calc) NOT APPLICABLE  Sodium     [id number] - 146 mmol/L 140  Potassium     3.5 - 5.3 mmol/L 4.0  Chloride     98 - 110 mmol/L 103  CO2     20 - 32 mmol/L 31  Calcium     8.6 - 10.4 mg/dL 10.0  Total Protein     6.1 - 8.1 g/dL 7.9  Albumin MSPROF     3.6 - 5.1 g/dL 4.1  Globulin     1.9 - 3.7 g/dL (calc) 3.8 (H)  AG Ratio     1.0 - 2.5 (calc) 1.1  Total Bilirubin     0.2 - 1.2 mg/dL 0.3  Alkaline phosphatase (APISO)     37 - 153 U/L 88  AST     10 - 35 U/L 23  ALT     6 - 29 U/L 16   Component     Latest Ref Rng & Units 07/11/2019  WBC     3.8 - 10.8 Thousand/uL 3.9  RBC     3.80 - 5.10 Million/uL 4.11  Hemoglobin     11.7 - 15.5 g/dL 12.2  HCT     35.0 - 45.0 % 37.0  MCV     80.0 - 100.0  fL  90.0  MCH     27.0 - 33.0 pg 29.7  MCHC     32.0 - 36.0 g/dL 33.0  RDW     11.0 - 15.0 % 13.0  Platelets     140 - 400 Thousand/uL 197  MPV     7.5 - 12.5 fL 12.5  NEUT#     1,500 - 7,800 cells/uL 1,849  Lymphocyte #     850 - 3,900 cells/uL 1,490  WBC mixed population     200 - 950 cells/uL   Eosinophils Absolute     15 - 500 cells/uL 101  Basophils Absolute     0 - 200 cells/uL 31  Neutrophils     % 47.4  Total Lymphocyte     % 38.2  Monocytes Relative     % 11.0  Eosinophil     % 2.6  Basophil     % 0.8  Absolute Monocytes     200 - 950 cells/uL 429   Urinalysis ***  Pertinent Imaging: ***  Assessment & Plan:   1. Urge incontinence Not bothersome RTC in one year for OAB questionnaire and PVR  2. Pelvic prolapse  Deferred surgery  3. Vaginal atrophy Continue vaginal estrogen cream Monday, Wednesday and Friday nights RTC in one year for exam   No follow-ups on file.  Zara Council, PA-C  [facility] [address] [place], [place] [address] [phone]

## 2020-05-16 NOTE — Progress Notes (Signed)
UA orders in.

## 2020-05-19 ENCOUNTER — Ambulatory Visit: Payer: Medicare Other | Admitting: Urology

## 2020-05-19 ENCOUNTER — Other Ambulatory Visit: Payer: Self-pay | Admitting: *Deleted

## 2020-05-19 DIAGNOSIS — N952 Postmenopausal atrophic vaginitis: Secondary | ICD-10-CM

## 2020-05-19 DIAGNOSIS — N3941 Urge incontinence: Secondary | ICD-10-CM

## 2020-05-19 DIAGNOSIS — N8111 Cystocele, midline: Secondary | ICD-10-CM

## 2020-07-15 ENCOUNTER — Ambulatory Visit: Payer: Medicare Other | Admitting: Family Medicine

## 2020-08-11 NOTE — Progress Notes (Signed)
Name: Meredith Wilcox   MRN: [id number]    DOB: Aug 16, 1940   Date:08/13/2020       Progress Note  Subjective  Chief Complaint  Follow Up  HPI  Leucopenia: she has a long history of leucopenia but stable, she also has a history of iron deficiency anemia, we will recheck CBC  Other fatigue: she has been active, walking dog, playing with her grandson, but feels more tired than usual. Weight is down but seems to go up and down   Cystocele : does not like pessary, seen by Urologist last visit 05/2019 she is using estrogen cream three times a week, Dr. Matilde Sprang discussed hysterectomy and cystocele repair. She states she is not ready for surgery at this time. She still has urinary urgency and frequency, worse is when she gets up to use the bathroom during the night.   History of DVT: Spring of 2019, resolved on repeat doppler US 10/2017. No problems since. She denies leg pain, swelling or SOB.No problems   Weight fluctuation: looking back weight was 156 lbs, went up to 160 lbs, after that went up to 165 last year and today down to 154.3 lbs. She states has not noticed any change on how her clothes fits  Seasonal allergies: she does not like taking medications, only drinking tea and lemon  Left  knee OA: symptoms are mild intermittent, not taking medication, no swelling, redness and is not affecting her level of activity   Sore throat: it happened last week, no fever, chills, lack of appetite. Denies rhinorrhea no cough. She is feeling well today   She continues to refuse vaccinations, mammograms or bone density   Patient Active Problem List   Diagnosis Date Noted  . Vaginal discharge 01/27/2017  . 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 Cystocele, midline 12/21/2016  . Vaginal atrophy 12/21/2016  . 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 History of Helicobacter pylori infection 11/16/2014  . 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 11/16/2014   . Incontinence 11/16/2014  . 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 BREAST BIOPSY Left 10/11/2016   left breast stereo/ VASCULAR LESION  . BREAST BIOPSY Left 12/02/2016   Procedure: NEEDLE LOCALIZATION;  Surgeon: [name], MD;  Location: [facility] ORS;  Service: General;  Laterality: Left;  . BREAST EXCISIONAL BIOPSY Left 12/02/2016   left lumpectomy  . BREAST LUMPECTOMY Left 12/02/2016   Procedure: EXCISION BREAST MASS;  Surgeon: [name], MD;  Location: [facility] ORS;  Service: General;  Laterality: Left;  . DILATION AND CURETTAGE OF UTERUS    . dnc     1983  . fatty tumor removal Right    hand    Family History  Problem Relation Age of Onset  . Diabetes Mother   . Hypertension Mother   . Cardiomyopathy Sister   . Down syndrome Sister   . Leukemia Brother   . Alcohol abuse Son        youngest son  . Asthma Son        youngest son  . Rheum arthritis Sister   . Drug abuse Child 23  . Breast cancer Neg Hx   . Kidney cancer Neg Hx   . Bladder Cancer Neg Hx   . Prostate cancer Neg Hx     Social History   Tobacco Use  . Smoking status: Never Smoker  . Smokeless tobacco: Never Used  . Tobacco  comment: smoking cessation materials not required  Substance Use Topics  . Alcohol use: No    Alcohol/week: 0.0 standard drinks     Current Outpatient Medications:  .  Ascorbic Acid (VITAMIN C) 1000 MG tablet, Take 1,000 mg by mouth daily., Disp: , Rfl:  .  Cholecalciferol (VITAMIN D) 2000 UNITS tablet, Take 2,000 Units by mouth daily. Reported on 06/04/2015, Disp: , Rfl:  .  conjugated estrogens (PREMARIN) vaginal cream, Apply pea size nightly, Disp: 30 g, Rfl: 12 .  Multiple Vitamins-Minerals ([name] PO), Take 1 tablet by mouth daily. , Disp: , Rfl:  .  Omega-3 Fatty Acids (OMEGA-3 PO), Take 1 capsule by mouth 2 (two) times daily. Once to twice daily (pt sometimes forgets 2nd dose), Disp: , Rfl:  .   TURMERIC PO, Take 1 capsule by mouth daily. , Disp: , Rfl:   Allergies  Allergen Reactions  . Tussionex Pennkinetic Er [Hydrocod Polst-Cpm Polst Er] Other (See Comments)    Real dizzy headed even after a while still walking every direction but straight, patient stated she stopped taking it because of that.  . Hydrocodone     Light headed and dizziness    I personally reviewed active problem list, medication list, allergies, family history, social history, health maintenance with the patient/caregiver today.   ROS  Constitutional: Negative for fever , positive for weight change.  Respiratory: Negative for cough and shortness of breath.   Cardiovascular: Negative for chest pain or palpitations.  Gastrointestinal: Negative for abdominal pain, no bowel changes.  Musculoskeletal: Negative for gait problem or joint swelling.  Skin: Negative for rash.  Neurological: Negative for dizziness or headache.  No other specific complaints in a complete review of systems (except as listed in HPI above).  Objective  Vitals:   08/13/20 1102  BP: 136/68  Pulse: 82  Resp: 16  Temp: 98.2 F (36.8 C)  SpO2: 99%  Weight: 154 lb 4.8 oz (70 kg)  Height: 5\' 5"  (1.651 m)    Body mass index is 25.68 kg/m.  Physical Exam  Constitutional: Patient appears well-developed and well-nourished. No distress.  HEENT: head atraumatic, normocephalic, pupils equal and reactive to light,neck supple Cardiovascular: Normal rate, regular rhythm and normal heart sounds.  No murmur heard. No BLE edema. Pulmonary/Chest: Effort normal and breath sounds normal. No respiratory distress. Abdominal: Soft.  There is no tenderness. Psychiatric: Patient has a normal mood and affect. behavior is normal. Judgment and thought content normal.   PHQ2/9: Depression screen [facility] 2/9 08/13/2020 11/08/2019 07/11/2019 12/14/2018 11/07/2018  Decreased Interest 0 0 0 0 0  Down, Depressed, Hopeless 0 0 0 0 0  PHQ - 2 Score 0 0 0 0 0   Altered sleeping - - 0 0 -  Tired, decreased energy - - 0 0 -  Change in appetite - - 0 0 -  Feeling bad or failure about yourself  - - 0 0 -  Trouble concentrating - - 0 0 -  Moving slowly or fidgety/restless - - 0 0 -  Suicidal thoughts - - 0 0 -  PHQ-9 Score - - 0 0 -  Difficult doing work/chores - - - Not difficult at all -    phq 9 is negative  Fall Risk: Fall Risk  08/13/2020 11/08/2019 07/11/2019 12/14/2018 11/07/2018  Falls in the past year? 0 0 0 0 0  Number falls in past yr: 0 0 0 0 0  Injury with Fall? 0 0 0 0 0  Risk for fall due to : - No Fall Risks - - -  Risk for fall due to: Comment - - - - -  Follow up - Falls prevention discussed - - Falls prevention discussed     Functional Status Survey: Is the patient deaf or have difficulty hearing?: No Does the patient have difficulty seeing, even when wearing glasses/contacts?: No Does the patient have difficulty concentrating, remembering, or making decisions?: No Does the patient have difficulty walking or climbing stairs?: No Does the patient have difficulty dressing or bathing?: No Does the patient have difficulty doing errands alone such as visiting a doctor's office or shopping?: No    Assessment & Plan  1. History of DVT (deep vein thrombosis)  Doing well   2. Other fatigue  - CBC with Differential/Platelet - COMPLETE METABOLIC PANEL WITH GFR - TSH  3. Other neutropenia (HCC)  - CBC with Differential/Platelet  4. Pelvic floor relaxation   5. Primary osteoarthritis of left knee  No pain at this time  6. Seasonal allergies  Refused medication

## 2020-08-13 ENCOUNTER — Encounter: Payer: Self-pay | Admitting: Family Medicine

## 2020-08-13 ENCOUNTER — Other Ambulatory Visit: Payer: Self-pay

## 2020-08-13 ENCOUNTER — Ambulatory Visit (INDEPENDENT_AMBULATORY_CARE_PROVIDER_SITE_OTHER): Payer: Medicare Other | Admitting: Family Medicine

## 2020-08-13 VITALS — BP 136/68 | HR 82 | Temp 98.2°F | Resp 16 | Ht 65.0 in | Wt 154.3 lb

## 2020-08-13 DIAGNOSIS — M1712 Unilateral primary osteoarthritis, left knee: Secondary | ICD-10-CM | POA: Diagnosis not present

## 2020-08-13 DIAGNOSIS — Z13820 Encounter for screening for osteoporosis: Secondary | ICD-10-CM

## 2020-08-13 DIAGNOSIS — R5383 Other fatigue: Secondary | ICD-10-CM

## 2020-08-13 DIAGNOSIS — N8189 Other female genital prolapse: Secondary | ICD-10-CM

## 2020-08-13 DIAGNOSIS — D708 Other neutropenia: Secondary | ICD-10-CM

## 2020-08-13 DIAGNOSIS — J302 Other seasonal allergic rhinitis: Secondary | ICD-10-CM

## 2020-08-13 DIAGNOSIS — Z86718 Personal history of other venous thrombosis and embolism: Secondary | ICD-10-CM | POA: Diagnosis not present

## 2020-08-13 DIAGNOSIS — Z1231 Encounter for screening mammogram for malignant neoplasm of breast: Secondary | ICD-10-CM

## 2020-08-13 LAB — CBC WITH DIFFERENTIAL/PLATELET
Eosinophils Relative: 3.7 %
HCT: 37 % (ref 35.0–45.0)
MCV: 89.4 fL (ref 80.0–100.0)
Monocytes Relative: 12.4 %
RBC: 4.14 10*6/uL (ref 3.80–5.10)
Total Lymphocyte: 42.7 %
WBC: 3.2 10*3/uL — ABNORMAL LOW (ref 3.8–10.8)

## 2020-08-14 LAB — CBC WITH DIFFERENTIAL/PLATELET
Absolute Monocytes: 397 cells/uL (ref 200–950)
Basophils Absolute: 19 cells/uL (ref 0–200)
Basophils Relative: 0.6 %
Eosinophils Absolute: 118 cells/uL (ref 15–500)
Hemoglobin: 11.8 g/dL (ref 11.7–15.5)
Lymphs Abs: 1366 cells/uL (ref 850–3900)
MCH: 28.5 pg (ref 27.0–33.0)
MCHC: 31.9 g/dL — ABNORMAL LOW (ref 32.0–36.0)
MPV: 12.5 fL (ref 7.5–12.5)
Neutro Abs: 1299 cells/uL — ABNORMAL LOW (ref 1500–7800)
Neutrophils Relative %: 40.6 %
Platelets: 207 10*3/uL (ref 140–400)
RDW: 12.7 % (ref 11.0–15.0)

## 2020-08-14 LAB — COMPLETE METABOLIC PANEL WITH GFR
AG Ratio: 1.2 (calc) (ref 1.0–2.5)
ALT: 14 U/L (ref 6–29)
AST: 24 U/L (ref 10–35)
Albumin: 4.3 g/dL (ref 3.6–5.1)
Alkaline phosphatase (APISO): 92 U/L (ref 37–153)
BUN: 10 mg/dL (ref 7–25)
CO2: 32 mmol/L (ref 20–32)
Calcium: 9.8 mg/dL (ref 8.6–10.4)
Chloride: 99 mmol/L (ref 98–110)
Creat: 0.78 mg/dL (ref 0.60–0.93)
GFR, Est African American: 84 mL/min/{1.73_m2} (ref >=60)
GFR, Est Non African American: 73 mL/min/{1.73_m2} (ref >=60)
Globulin: 3.5 g/dL (calc) (ref 1.9–3.7)
Glucose, Bld: 91 mg/dL (ref 65–99)
Potassium: 4 mmol/L (ref 3.5–5.3)
Sodium: 136 mmol/L (ref 135–146)
Total Bilirubin: 0.4 mg/dL (ref 0.2–1.2)
Total Protein: 7.8 g/dL (ref 6.1–8.1)

## 2020-08-14 LAB — TSH: TSH: 1.36 mIU/L (ref 0.40–4.50)

## 2020-11-03 ENCOUNTER — Telehealth: Payer: Self-pay | Admitting: *Deleted

## 2020-11-03 NOTE — Chronic Care Management (AMB) (Signed)
  Chronic Care Management   Outreach Note  11/03/2020 Name: Meredith Wilcox MRN: [id number] DOB: 12-30-1940  Meredith Wilcox is a 80 y.o. year old female who is a primary care patient of [name], MD. I reached out to [name] by phone today in response to a referral sent by [name] PCP [name], MD     An unsuccessful telephone outreach was attempted today. The patient was referred to the case management team for assistance with care management and care coordination.   Follow Up Plan: A HIPAA compliant phone message was left for the patient providing contact information and requesting a return call.  If patient returns call to provider office, please advise to call Embedded Care Management Care Guide Seraphim Trow at [place] [place] [facility]  Direct Dial: [phone]

## 2020-11-11 ENCOUNTER — Telehealth: Payer: Self-pay | Admitting: Family Medicine

## 2020-11-11 ENCOUNTER — Ambulatory Visit: Payer: Medicare Other

## 2020-11-11 NOTE — Telephone Encounter (Signed)
Copied from [place] [id number]. Topic: Medicare AWV >> Nov 11, 2020  3:08 PM Cher Nakai R wrote: Reason for CRM:  Left message for patient to call back and schedule Medicare Annual Wellness Visit (AWV) in office.   If unable to come into the office for AWV,  please offer to do virtually or by telephone.  Last AWV:  11/08/2019  Please schedule at anytime with [place].  40 minute appointment  Any questions, please contact me at [phone]

## 2020-11-13 NOTE — Chronic Care Management (AMB) (Signed)
  Chronic Care Management   Outreach Note  11/13/2020 Name: Meredith Wilcox MRN: [id number] DOB: Aug 10, 1940  Meredith Wilcox is a 80 y.o. year old female who is a primary care patient of [name], MD. I reached out to Meredith Wilcox by phone today in response to a referral sent by Meredith Wilcox's PCP [name], MD     A second unsuccessful telephone outreach was attempted today. The patient was referred to the case management team for assistance with care management and care coordination.   Follow Up Plan: A HIPAA compliant phone message was left for the patient providing contact information and requesting a return call.  If patient returns call to provider office, please advise to call Embedded Care Management Care Guide Meredith Wilcox at [place]  Direct Dial: [phone]

## 2020-11-18 NOTE — Chronic Care Management (AMB) (Signed)
  Chronic Care Management   Outreach Note  11/18/2020 Name: Meredith Wilcox MRN: [id number] DOB: 1941/03/13  Meredith Wilcox is a 80 y.o. year old female who is a primary care patient of [name], MD. I reached out to Meredith Wilcox by phone today in response to a referral sent by Meredith Wilcox, Meredith Stager, MD     Third unsuccessful telephone outreach was attempted today. The care management team is pleased to engage with this patient at any time in the future should he/she be interested in assistance from the care management team.   Follow Up Plan: We have been unable to make contact with the patient for follow up.    Meredith Wilcox, [facility]  Direct Dial: [phone]

## 2020-11-25 ENCOUNTER — Ambulatory Visit: Payer: Medicare Other

## 2020-12-04 ENCOUNTER — Ambulatory Visit: Payer: Medicare Other

## 2021-05-18 ENCOUNTER — Ambulatory Visit
Admission: RE | Admit: 2021-05-18 | Discharge: 2021-05-18 | Disposition: A | Discharge status: Home / Self Care | Payer: Medicare Other | Attending: Family Medicine | Admitting: Family Medicine

## 2021-05-18 ENCOUNTER — Encounter: Payer: Self-pay | Admitting: Family Medicine

## 2021-05-18 ENCOUNTER — Ambulatory Visit (INDEPENDENT_AMBULATORY_CARE_PROVIDER_SITE_OTHER): Payer: Medicare Other | Admitting: Family Medicine

## 2021-05-18 ENCOUNTER — Other Ambulatory Visit: Payer: Self-pay

## 2021-05-18 ENCOUNTER — Ambulatory Visit
Admission: RE | Admit: 2021-05-18 | Discharge: 2021-05-18 | Disposition: A | Discharge status: Home / Self Care | Payer: Medicare Other | Source: Ambulatory Visit | Attending: Family Medicine | Admitting: Family Medicine

## 2021-05-18 VITALS — BP 118/70 | HR 85 | Temp 98.0°F | Resp 16 | Ht 64.0 in | Wt 150.0 lb

## 2021-05-18 DIAGNOSIS — R059 Cough, unspecified: Secondary | ICD-10-CM | POA: Diagnosis not present

## 2021-05-18 DIAGNOSIS — J029 Acute pharyngitis, unspecified: Secondary | ICD-10-CM | POA: Diagnosis not present

## 2021-05-18 DIAGNOSIS — R0989 Other specified symptoms and signs involving the circulatory and respiratory systems: Secondary | ICD-10-CM | POA: Diagnosis not present

## 2021-05-18 DIAGNOSIS — R432 Parageusia: Secondary | ICD-10-CM | POA: Diagnosis not present

## 2021-05-18 DIAGNOSIS — R058 Other specified cough: Secondary | ICD-10-CM | POA: Insufficient documentation

## 2021-05-18 NOTE — Addendum Note (Signed)
Addended by: [name] F on: 05/18/2021 09:06 AM   Modules accepted: Orders

## 2021-05-18 NOTE — Progress Notes (Signed)
Name: Meredith Wilcox   MRN: [id number]    DOB: 1940/08/19   Date:05/18/2021       Progress Note  Subjective  Chief Complaint  Chief Complaint  Patient presents with   Cough    X1 week    HPI  Patient states her adult son had similar symptoms last week. She developed a cough on Feb 1 st that got progressively worse the following day, associated with yellow much, rhinorrhea, nasal congestion, slight sore throat, two days ago she lost her sense of taste - everything tastes like cardboard. She denies fever or chills, no N/V or diarrhea. She denies SOB but is feeling very tired and has poor appetite.   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 Reported on 06/04/2015, Disp: , Rfl:    conjugated estrogens (PREMARIN) vaginal cream, Apply pea size nightly, Disp: 30 g, Rfl: 12   Multiple Vitamins-Minerals ([name] PO), Take 1 tablet by mouth daily. , Disp: , Rfl:    Omega-3 Fatty Acids (OMEGA-3 PO), Take 1 capsule by mouth 2 (two) times daily. Once to twice daily (pt sometimes forgets 2nd dose), Disp: , Rfl:    TURMERIC PO, Take 1 capsule by mouth daily. , Disp: , Rfl:    Allergies  Allergen Reactions   Tussionex Pennkinetic Er [Hydrocod Poli-Chlorphe Poli Er] Other (See Comments)    Real dizzy headed even after a while still walking every direction but straight, patient stated she stopped taking it because of that.   Hydrocodone     Light headed and dizziness    ROS  Ten systems reviewed and is negative except as mentioned in HPI   Objective  Vitals:   05/18/21 0830  BP: 118/70  Pulse: 85  Resp: 16  Temp: 98 F (36.7 C)  SpO2: 97%  Weight: 150 lb (68 kg)  Height: 5\' 4"  (1.626 m)    Body mass index is 25.75 kg/m.    Physical Exam  Constitutional: Patient appears well-developed and well-nourished.  No distress.  HEENT: head atraumatic, normocephalic Cardiovascular: Normal rate, regular rhythm and normal heart sounds.  No murmur heard. No BLE edema. Pulmonary/Chest: shallow breathing to avoid triggering a cough, cough sounded wet, rhonchi throughout both lung fields No respiratory distress. Abdominal: Soft.  There is no tenderness. Psychiatric: Patient has a normal mood and affect. behavior is normal. Judgment and thought content normal.   Assessment & Plan   1. Productive cough  - DG Chest 2 View; Future  2. Absence of sense of taste   3. Sore throat   4. Rhonchi at both lung bases  - DG Chest  2 View; Future  She is 80 on day 5 of illness with typical symptoms of COVID and lack of sense of smell, discussed we will start therapy and stop if test returns negative.   I have spoken and communicated the following to the patient or parent/caregiver regarding: Paxlovid is an unapproved drug that is authorized for use under an Emergency Use Authorization.  There are no adequate, approved, available products for the treatment of COVID-19 in adults who have mild-to-moderate COVID-19 and are at high risk for progressing to severe COVID-19, including hospitalization or death. Other therapeutics are currently authorized. For  additional information on all products authorized for treatment or prevention of COVID-19, please see [employer].  There are benefits and risks of taking this treatment as outlined in the Fact Sheet for Patients and Caregivers.  Fact Sheet for Patients and Caregivers was reviewed with patient. A hard copy will be provided to patient from pharmacy prior to the patient receiving treatment. Patients should continue to self-isolate and use infection control measures (e.g., wear mask, isolate, social distance, avoid sharing personal items, clean and disinfect high touch surfaces, and frequent handwashing) according to CDC guidelines.  The patient or parent/caregiver has the option to accept or refuse treatment. Patient medication history was reviewed for potential drug interactions:No drug interactions Patients GFR was calculated to be 84, and they were therefore prescribed Normal dose (GFR>60) - nirmatrelvir 150mg  tab (2 tablet) by mouth twice daily AND ritonavir 100mg  tab (1 tablet) by mouth twice daily  After reviewing above information with the patient, the patient agrees to receive Paxlovid.  Follow up instructions:    Take prescription BID x 5 days as directed Reach out to pharmacist for counseling on medication if desired For concerns regarding further COVID symptoms please follow up with your PCP or urgent care For urgent or life-threatening issues, seek care at your local emergency department  The patient was provided an opportunity to ask questions, and all were answered. The patient agreed with the plan and demonstrated an understanding of the instructions.    The patient was advised to call their PCP or seek an in-person evaluation if the symptoms worsen or if the condition fails to improve as anticipate

## 2021-05-19 ENCOUNTER — Other Ambulatory Visit: Payer: Self-pay | Admitting: Family Medicine

## 2021-05-19 LAB — SARS-COV-2, NAA 2 DAY TAT

## 2021-05-19 LAB — NOVEL CORONAVIRUS, NAA: SARS-CoV-2, NAA: NOT DETECTED

## 2021-05-19 MED ORDER — BENZONATATE 100 MG PO CAPS: 100.0000 mg | ORAL_CAPSULE | Freq: Three times a day (TID) | ORAL | 0 refills | Status: DC | PRN

## 2021-05-19 MED ORDER — AZITHROMYCIN 250 MG PO TABS: 250.0000 mg | ORAL_TABLET | Freq: Every day | ORAL | 0 refills | Status: DC

## 2021-05-19 MED ORDER — PREDNISONE 10 MG PO TABS: 10.0000 mg | ORAL_TABLET | Freq: Two times a day (BID) | ORAL | 0 refills | Status: DC

## 2021-06-29 ENCOUNTER — Ambulatory Visit: Payer: Self-pay

## 2021-06-29 NOTE — Telephone Encounter (Signed)
Patient called, left VM to return the call to the office to discuss symptoms with a nurse. ? ? ?Summary: advice - dark urine  ? Pt called in with some concerns since she has had dark colored urine, pt stated she drinks a lot of water and does not drink coffee or soda, pt is not sure why her urine is dark, pt did not mention symptoms or back pain, was mostly unsure why her urine is so dark, pt needed advice  ?  ? ?

## 2021-06-29 NOTE — Telephone Encounter (Signed)
?  Chief Complaint: urine dark ?Symptoms: none ?Frequency: almost always ?Pertinent Negatives: Patient denies fever, dysuria ?Disposition: '[]'$ ED /'[]'$ Urgent Care (no appt availability in office) / '[x]'$ Appointment(In office/virtual)/ '[]'$  [facility]/ '[]'$ Home Care/ '[]'$ Refused Recommended Disposition /'[]'$ [facility]/ '[]'$  Follow-up with PCP ?Additional Notes: Pt called due to urine dark, she has started drinking just water, it helps for a while and then it happens again even though just drinking water. She wants to be seen next week so she has a week to just drink water and see.Appt made for 1 week 07/06/21. ? ? ?Reason for Disposition ? All other urine symptoms ? ?Answer Assessment - Initial Assessment Questions ?1. SYMPTOM: "What's the main symptom you're concerned about?" (e.g., frequency, incontinence) ?    Dark urine, brown in color but not dark ?2. ONSET: "When did the  dark urine  start?" ?    Off and on for two weeks ?3. PAIN: "Is there any pain?" If Yes, ask: "How bad is it?" (Scale: 1-10; mild, moderate, severe) ?    no ?4. CAUSE: "What do you think is causing the symptoms?" ?    Unsure, already drinking a lot of water ?5. OTHER SYMPTOMS: "Do you have any other symptoms?" (e.g., fever, flank pain, blood in urine, pain with urination) ?    no ?6. PREGNANCY: "Is there any chance you are pregnant?" "When was your last menstrual period?" ?    na ? ?Protocols used: Urinary Symptoms-A-AH ? ?

## 2021-07-06 ENCOUNTER — Encounter: Payer: Self-pay | Admitting: Family Medicine

## 2021-07-06 ENCOUNTER — Ambulatory Visit (INDEPENDENT_AMBULATORY_CARE_PROVIDER_SITE_OTHER): Payer: Medicare Other | Admitting: Family Medicine

## 2021-07-06 ENCOUNTER — Other Ambulatory Visit: Payer: Self-pay

## 2021-07-06 VITALS — BP 166/70 | HR 88 | Temp 97.9°F | Resp 16 | Ht 64.0 in | Wt 147.1 lb

## 2021-07-06 DIAGNOSIS — Z833 Family history of diabetes mellitus: Secondary | ICD-10-CM | POA: Diagnosis not present

## 2021-07-06 DIAGNOSIS — Z806 Family history of leukemia: Secondary | ICD-10-CM

## 2021-07-06 DIAGNOSIS — Z8249 Family history of ischemic heart disease and other diseases of the circulatory system: Secondary | ICD-10-CM | POA: Diagnosis not present

## 2021-07-06 DIAGNOSIS — K141 Geographic tongue: Secondary | ICD-10-CM | POA: Diagnosis present

## 2021-07-06 DIAGNOSIS — E785 Hyperlipidemia, unspecified: Secondary | ICD-10-CM | POA: Diagnosis not present

## 2021-07-06 DIAGNOSIS — M1990 Unspecified osteoarthritis, unspecified site: Secondary | ICD-10-CM | POA: Diagnosis present

## 2021-07-06 DIAGNOSIS — Z888 Allergy status to other drugs, medicaments and biological substances status: Secondary | ICD-10-CM | POA: Diagnosis not present

## 2021-07-06 DIAGNOSIS — Z79899 Other long term (current) drug therapy: Secondary | ICD-10-CM

## 2021-07-06 DIAGNOSIS — Z825 Family history of asthma and other chronic lower respiratory diseases: Secondary | ICD-10-CM

## 2021-07-06 DIAGNOSIS — K831 Obstruction of bile duct: Secondary | ICD-10-CM | POA: Diagnosis not present

## 2021-07-06 DIAGNOSIS — R109 Unspecified abdominal pain: Secondary | ICD-10-CM | POA: Diagnosis not present

## 2021-07-06 DIAGNOSIS — R5383 Other fatigue: Secondary | ICD-10-CM | POA: Diagnosis not present

## 2021-07-06 DIAGNOSIS — R82998 Other abnormal findings in urine: Secondary | ICD-10-CM | POA: Diagnosis not present

## 2021-07-06 DIAGNOSIS — I700 Atherosclerosis of aorta: Secondary | ICD-10-CM | POA: Diagnosis not present

## 2021-07-06 DIAGNOSIS — R030 Elevated blood-pressure reading, without diagnosis of hypertension: Secondary | ICD-10-CM

## 2021-07-06 DIAGNOSIS — R17 Unspecified jaundice: Secondary | ICD-10-CM

## 2021-07-06 DIAGNOSIS — Z8279 Family history of other congenital malformations, deformations and chromosomal abnormalities: Secondary | ICD-10-CM | POA: Diagnosis not present

## 2021-07-06 DIAGNOSIS — R7401 Elevation of levels of liver transaminase levels: Secondary | ICD-10-CM | POA: Diagnosis not present

## 2021-07-06 DIAGNOSIS — R822 Biliuria: Secondary | ICD-10-CM

## 2021-07-06 DIAGNOSIS — D649 Anemia, unspecified: Secondary | ICD-10-CM | POA: Diagnosis present

## 2021-07-06 DIAGNOSIS — C250 Malignant neoplasm of head of pancreas: Secondary | ICD-10-CM | POA: Diagnosis present

## 2021-07-06 DIAGNOSIS — K8689 Other specified diseases of pancreas: Secondary | ICD-10-CM | POA: Diagnosis not present

## 2021-07-06 DIAGNOSIS — I10 Essential (primary) hypertension: Secondary | ICD-10-CM | POA: Diagnosis present

## 2021-07-06 DIAGNOSIS — K219 Gastro-esophageal reflux disease without esophagitis: Secondary | ICD-10-CM | POA: Diagnosis present

## 2021-07-06 DIAGNOSIS — D259 Leiomyoma of uterus, unspecified: Secondary | ICD-10-CM | POA: Diagnosis present

## 2021-07-06 DIAGNOSIS — K869 Disease of pancreas, unspecified: Secondary | ICD-10-CM | POA: Diagnosis present

## 2021-07-06 DIAGNOSIS — Z885 Allergy status to narcotic agent status: Secondary | ICD-10-CM

## 2021-07-06 DIAGNOSIS — Z8261 Family history of arthritis: Secondary | ICD-10-CM

## 2021-07-06 LAB — CBC WITH DIFFERENTIAL/PLATELET
Abs Immature Granulocytes: 0.01 10*3/uL (ref 0.00–0.07)
Basophils Absolute: 0 10*3/uL (ref 0.0–0.1)
Basophils Relative: 1 %
Eosinophils Absolute: 0 10*3/uL (ref 0.0–0.5)
Eosinophils Relative: 1 %
HCT: 34.4 % — ABNORMAL LOW (ref 36.0–46.0)
Hemoglobin: 11.5 g/dL — ABNORMAL LOW (ref 12.0–15.0)
Immature Granulocytes: 0 %
Lymphocytes Relative: 30 %
Lymphs Abs: 1.2 10*3/uL (ref 0.7–4.0)
MCH: 28.3 pg (ref 26.0–34.0)
MCHC: 33.4 g/dL (ref 30.0–36.0)
MCV: 84.7 fL (ref 80.0–100.0)
Monocytes Absolute: 0.6 10*3/uL (ref 0.1–1.0)
Monocytes Relative: 14 %
Neutro Abs: 2.2 10*3/uL (ref 1.7–7.7)
Neutrophils Relative %: 54 %
Platelets: 262 10*3/uL (ref 150–400)
RBC: 4.06 MIL/uL (ref 3.87–5.11)
RDW: 18.5 % — ABNORMAL HIGH (ref 11.5–15.5)
WBC: 4 10*3/uL (ref 4.0–10.5)
nRBC: 0 % (ref 0.0–0.2)

## 2021-07-06 LAB — POCT URINALYSIS DIPSTICK
Bilirubin, UA: POSITIVE
Glucose, UA: NEGATIVE
Ketones, UA: NEGATIVE
Nitrite, UA: NEGATIVE
Protein, UA: NEGATIVE
Spec Grav, UA: 1.025 (ref 1.010–1.025)
Urobilinogen, UA: 0.2 E.U./dL
pH, UA: 5 (ref 5.0–8.0)

## 2021-07-06 LAB — URINALYSIS, ROUTINE W REFLEX MICROSCOPIC
Glucose, UA: NEGATIVE mg/dL
Hgb urine dipstick: NEGATIVE
Ketones, ur: NEGATIVE mg/dL
Leukocytes,Ua: NEGATIVE
Nitrite: NEGATIVE
Protein, ur: NEGATIVE mg/dL
Specific Gravity, Urine: 1.009 (ref 1.005–1.030)
pH: 5 (ref 5.0–8.0)

## 2021-07-06 LAB — BASIC METABOLIC PANEL
Anion gap: 10 (ref 5–15)
BUN: 10 mg/dL (ref 8–23)
CO2: 27 mmol/L (ref 22–32)
Calcium: 9.7 mg/dL (ref 8.9–10.3)
Chloride: 101 mmol/L (ref 98–111)
Creatinine, Ser: 0.62 mg/dL (ref 0.44–1.00)
GFR, Estimated: >60 mL/min (ref >60)
Glucose, Bld: 131 mg/dL — ABNORMAL HIGH (ref 70–99)
Potassium: 3.4 mmol/L — ABNORMAL LOW (ref 3.5–5.1)
Sodium: 138 mmol/L (ref 135–145)

## 2021-07-06 LAB — HEPATIC FUNCTION PANEL
ALT: 241 U/L — ABNORMAL HIGH (ref 0–44)
AST: 194 U/L — ABNORMAL HIGH (ref 15–41)
Albumin: 3.6 g/dL (ref 3.5–5.0)
Alkaline Phosphatase: 829 U/L — ABNORMAL HIGH (ref 38–126)
Bilirubin, Direct: 7.7 mg/dL — ABNORMAL HIGH (ref 0.0–0.2)
Indirect Bilirubin: 4.9 mg/dL — ABNORMAL HIGH (ref 0.3–0.9)
Total Bilirubin: 12.6 mg/dL — ABNORMAL HIGH (ref 0.3–1.2)
Total Protein: 8.7 g/dL — ABNORMAL HIGH (ref 6.5–8.1)

## 2021-07-06 NOTE — Progress Notes (Signed)
I called and spoke with pt tonight, regarding her lab results which are concerning for liver failure- transaminitis and bili 11.7 -  ?pt has correlating sx dark urine acholic stools, generalize malaise, pruritis, jaundice on exam today  ? ?I encourage her to go to the ER for further evaluation, and I did review and discuss other possible symptoms and signs - fluid overload/ascities, and pain N, V and/or hepatic encephalopathy ? ?She is going to discuss with her husband and let me know if she decides to go in for further eval

## 2021-07-06 NOTE — Progress Notes (Signed)
? ? ?Patient ID: Meredith Wilcox, female    DOB: [birth year], 81 y.o.   MRN: [id number] ? ?PCP: [name], MD ? ?Chief Complaint  ?Patient presents with  ? dark urine  ?  Denies pain, blood or frequent urination. Onset for 2 weeks off/on with being dark color  ? ? ?Subjective:  ? ?Meredith Wilcox is a 81 y.o. female, presents to clinic with CC of the following: ? ?HPI  ?Presents for a little over a week of dark urine color.  She noticed yesterday it was a little lighter, this am again darker.  Seems to be darker when urine is held longer. ?She has no associated urinary frequency, urgency, dysuria, abdominal pain, flank pain, hematuria, change in appetite. ?She does have history of cystocele, does not use her pessary denies any history of recurrent UTIs. ? ?Upon further investigation the only symptoms she has had recently is slight decrease in energy/fatigue and some skin itching despite using her normal moisturizers. ? ?Denies abdominal pain, weight loss, nausea, vomiting, diarrhea, confusion. ?Does report her stool is not as dark as normal sometimes appears "chalky" ? ?Her blood pressure is elevated today, she has no history of hypertension remained elevated when rechecked in clinic  ?BP Readings from Last 3 Encounters:  ?07/06/21 (!) 166/70  ?05/18/21 118/70  ?08/13/20 136/68  ? ? ? ? ? ?Patient Active Problem List  ? Diagnosis Date Noted  ? Vaginal discharge 01/27/2017  ? 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 Vaginal atrophy 12/21/2016  ? 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 History of Helicobacter pylori infection 11/16/2014  ? 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 Incontinence 11/16/2014  ? 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 ? ? ? ?Current Outpatient Medications:  ?  Ascorbic Acid (VITAMIN C) 1000 MG tablet, Take 1,000 mg by mouth daily., Disp: , Rfl:  ?  azithromycin (ZITHROMAX) 250 MG tablet, Take 1-2 tablets  (250-500 mg total) by mouth daily. Take 2 tablets on day 1, then 1 tablet daily on days 2 through 5, Disp: 6 tablet, Rfl: 0 ?  Cholecalciferol (VITAMIN D) 2000 UNITS tablet, Take 2,000 Units by mouth daily. Reported on 06/04/2015, Disp: , Rfl:  ?  conjugated estrogens (PREMARIN) vaginal cream, Apply pea size nightly, Disp: 30 g, Rfl: 12 ?  Multiple Vitamins-Minerals ([name] PO), Take 1 tablet by mouth daily. , Disp: , Rfl:  ?  Omega-3 Fatty Acids (OMEGA-3 PO), Take 1 capsule by mouth 2 (two) times daily. Once to twice daily (pt sometimes forgets 2nd dose), Disp: , Rfl:  ?  predniSONE (DELTASONE) 10 MG tablet, Take 1 tablet (10 mg total) by mouth 2 (two) times daily with a meal., Disp: 10 tablet, Rfl: 0 ?  TURMERIC PO, Take 1 capsule by mouth daily. , Disp: , Rfl:  ?  benzonatate (TESSALON) 100 MG capsule, Take 1-2 capsules (100-200 mg total) by mouth 3 (three) times daily as needed. (Patient not taking: Reported on 07/06/2021), Disp: 40 capsule, Rfl: 0 ? ? ?Allergies  ?Allergen Reactions  ? Tussionex Pennkinetic Er [Hydrocod Poli-Chlorphe Poli Er] Other (See Comments)  ?  Real dizzy headed even after a while still walking every direction but straight, patient stated she stopped taking it because of that.  ? Hydrocodone   ?  Light headed and dizziness  ? ? ? ?Social History  ? ?Tobacco Use  ? Smoking status: Never  ? Smokeless tobacco: Never  ?  Tobacco comments:  ?  smoking cessation materials not required  ?Vaping Use  ? Vaping Use: Never used  ?Substance Use Topics  ? Alcohol use: No  ?  Alcohol/week: 0.0 standard drinks  ? Drug use: No  ?  ? ? ?Chart Review Today: ?I personally reviewed active problem list, medication list, allergies, family history, social history, health maintenance, notes from last encounter, lab results, imaging with the patient/caregiver today. ? ? ?Review of Systems  ?Constitutional: Negative.   ?HENT: Negative.    ?Eyes: Negative.   ?Respiratory: Negative.    ?Cardiovascular:  Negative.   ?Gastrointestinal: Negative.   ?Endocrine: Negative.   ?Genitourinary: Negative.   ?Musculoskeletal: Negative.   ?Skin: Negative.   ?Allergic/Immunologic: Negative.   ?Neurological: Negative.   ?Hematological: Negative.   ?Psychiatric/Behavioral: Negative.    ?All other systems reviewed and are negative. ? ?   ?Objective:  ? ?Vitals:  ? 07/06/21 0858 07/06/21 0937  ?BP: (!) 164/72 (!) 166/70  ?Pulse: 88   ?Resp: 16   ?Temp: 97.9 ?F (36.6 ?C)   ?TempSrc: Oral   ?SpO2: 100%   ?Weight: 147 lb 1.6 oz (66.7 kg)   ?Height: '5\' 4"'$  (1.626 m)   ?  ?Body mass index is 25.25 kg/m?. ? ?Physical Exam ?Vitals and nursing note reviewed.  ?Constitutional:   ?   General: She is not in acute distress. ?   Appearance: Normal appearance. She is not ill-appearing, toxic-appearing or diaphoretic.  ?HENT:  ?   Head: Normocephalic and atraumatic.  ?   Comments: Intraoral jaundice  ?   Right Ear: External ear normal.  ?   Left Ear: External ear normal.  ?   Nose: Nose normal. No congestion or rhinorrhea.  ?   Mouth/Throat:  ?   Mouth: Mucous membranes are moist.  ?   Pharynx: Oropharynx is clear. No oropharyngeal exudate or posterior oropharyngeal erythema.  ?Eyes:  ?   General: Scleral icterus present.     ?   Right eye: No discharge.     ?   Left eye: No discharge.  ?   Conjunctiva/sclera: Conjunctivae normal.  ?   Pupils: Pupils are equal, round, and reactive to light.  ?Cardiovascular:  ?   Rate and Rhythm: Normal rate and regular rhythm.  ?   Pulses: Normal pulses.  ?   Heart sounds: Normal heart sounds. No murmur heard. ?  No friction rub. No gallop.  ?Pulmonary:  ?   Effort: Pulmonary effort is normal. No respiratory distress.  ?   Breath sounds: Normal breath sounds. No stridor. No wheezing, rhonchi or rales.  ?Abdominal:  ?   General: Bowel sounds are normal. There is no distension.  ?   Palpations: Abdomen is soft.  ?   Tenderness: There is no abdominal tenderness. There is no right CVA tenderness, left CVA tenderness  or guarding.  ?Musculoskeletal:  ?   Cervical back: Normal range of motion.  ?   Right lower leg: No edema.  ?   Left lower leg: No edema.  ?Skin: ?   General: Skin is warm.  ?   Capillary Refill: Capillary refill takes less than 2 seconds.  ?   Coloration: Skin is jaundiced. Skin is not pale.  ?   Findings: No lesion or rash.  ?Neurological:  ?   Mental Status: She is alert.  ?   Cranial Nerves: No dysarthria or facial asymmetry.  ?   Motor: Motor function is intact.  ?  Coordination: Coordination is intact.  ?   Gait: Gait normal.  ?   Comments: No asterixis  ?Psychiatric:     ?   Mood and Affect: Mood normal.     ?   Behavior: Behavior normal.  ?  ? ?Results for orders placed or performed in visit on 07/06/21  ?POCT Urinalysis Dipstick  ?Result Value Ref Range  ? Color, UA Dark Yellow   ? Clarity, UA Dark cloudy   ? Glucose, UA Negative Negative  ? Bilirubin, UA Positive   ? Ketones, UA Negative   ? Spec Grav, UA 1.025 1.010 - 1.025  ? Blood, UA Trace   ? pH, UA 5.0 5.0 - 8.0  ? Protein, UA Negative Negative  ? Urobilinogen, UA 0.2 0.2 or 1.0 E.U./dL  ? Nitrite, UA Negative   ? Leukocytes, UA Trace (A) Negative  ? Appearance Dark Yellow   ? Odor Foul   ? ? ?   ?Assessment & Plan:  ? ?  ICD-10-CM   ?1. Dark urine  R82.998 POCT Urinalysis Dipstick  ?  Urine Culture  ?  COMPLETE METABOLIC PANEL WITH GFR  ?  Urinalysis, microscopic only  ?  CBC with Differential/Platelet  ?  ?2. Bilirubin in urine  S56.8 COMPLETE METABOLIC PANEL WITH GFR  ?  Urinalysis, microscopic only  ?  CBC with Differential/Platelet  ?  Bilirubin, fractionated (tot/dir/indir)  ?  ?3. Jaundice  L27 COMPLETE METABOLIC PANEL WITH GFR  ?  CBC with Differential/Platelet  ?  Bilirubin, fractionated (tot/dir/indir)  ?  INR/PT  ? visible in eyes, in mouth  ?  ?4. Fatigue, unspecified type  N17.00 COMPLETE METABOLIC PANEL WITH GFR  ?  CBC with Differential/Platelet  ?  ?5. Elevated blood-pressure reading without diagnosis of hypertension  R03.0   ? pt  visibly worried and anxious, no improvement with repeated measurements, will recheck at f/up appt  ?  ? ?Pt is 81 y/o female presents with CC of dark urine - upon further discussion and exam - reported and noted ach

## 2021-07-06 NOTE — Progress Notes (Signed)
Received results on some of labs prior to leaving for the day - showed them to Dr. Ancil Boozer ?She recommended imaging with f/up ?I reached out to [facility] to see if they could work her in urgently tomorrow - Dr. Marius Ditch asked if pt needed to be admitted  ?She was neurologically and hemodynamically stable in clinic, but I am concerned with transaminitis and bili that she could clinically worsen.   ?Labs routed to PCP/Dr. Ancil Boozer and I will follow closely for remaining labs and call and check on pt this evening. ?If any signs of worsening I will advise her to go to ED. ? ?Delsa Grana, PA-C ?07/06/21 5:22 PM ? ?

## 2021-07-06 NOTE — ED Triage Notes (Signed)
Pt presents to ER stating she was sent by her doctor for eval of her elevated bilirubin levels.  Bilirubin today was 11.7 per lab work done.  Pt denies any abd pain.  Pt endorses some chalky colored stools.  Pt denies weight gain, denies hx of liver problems.  Pt is A&O x4 at this time in NAD.   ?

## 2021-07-07 ENCOUNTER — Encounter
Admission: EM | Disposition: A | Discharge status: Home / Self Care | Payer: Self-pay | Source: Home / Self Care | Attending: Internal Medicine

## 2021-07-07 ENCOUNTER — Emergency Department: Payer: Medicare Other

## 2021-07-07 ENCOUNTER — Inpatient Hospital Stay: Payer: Medicare Other

## 2021-07-07 ENCOUNTER — Encounter: Payer: Self-pay | Admitting: Internal Medicine

## 2021-07-07 ENCOUNTER — Inpatient Hospital Stay: Payer: Medicare Other | Admitting: Anesthesiology

## 2021-07-07 ENCOUNTER — Inpatient Hospital Stay
Admission: EM | Admit: 2021-07-07 | Discharge: 2021-07-08 | DRG: 445 | Disposition: A | Discharge status: Home / Self Care | Payer: Medicare Other | Attending: Internal Medicine | Admitting: Internal Medicine

## 2021-07-07 DIAGNOSIS — Z806 Family history of leukemia: Secondary | ICD-10-CM | POA: Diagnosis not present

## 2021-07-07 DIAGNOSIS — R17 Unspecified jaundice: Secondary | ICD-10-CM | POA: Diagnosis not present

## 2021-07-07 DIAGNOSIS — Z833 Family history of diabetes mellitus: Secondary | ICD-10-CM | POA: Diagnosis not present

## 2021-07-07 DIAGNOSIS — I10 Essential (primary) hypertension: Secondary | ICD-10-CM | POA: Diagnosis present

## 2021-07-07 DIAGNOSIS — E785 Hyperlipidemia, unspecified: Secondary | ICD-10-CM | POA: Diagnosis present

## 2021-07-07 DIAGNOSIS — K869 Disease of pancreas, unspecified: Secondary | ICD-10-CM | POA: Diagnosis present

## 2021-07-07 DIAGNOSIS — Z8279 Family history of other congenital malformations, deformations and chromosomal abnormalities: Secondary | ICD-10-CM | POA: Diagnosis not present

## 2021-07-07 DIAGNOSIS — Z825 Family history of asthma and other chronic lower respiratory diseases: Secondary | ICD-10-CM | POA: Diagnosis not present

## 2021-07-07 DIAGNOSIS — K8689 Other specified diseases of pancreas: Secondary | ICD-10-CM

## 2021-07-07 DIAGNOSIS — K831 Obstruction of bile duct: Secondary | ICD-10-CM

## 2021-07-07 DIAGNOSIS — R109 Unspecified abdominal pain: Secondary | ICD-10-CM | POA: Diagnosis not present

## 2021-07-07 DIAGNOSIS — Z888 Allergy status to other drugs, medicaments and biological substances status: Secondary | ICD-10-CM | POA: Diagnosis not present

## 2021-07-07 DIAGNOSIS — M1990 Unspecified osteoarthritis, unspecified site: Secondary | ICD-10-CM | POA: Diagnosis present

## 2021-07-07 DIAGNOSIS — K141 Geographic tongue: Secondary | ICD-10-CM | POA: Diagnosis present

## 2021-07-07 DIAGNOSIS — Z8249 Family history of ischemic heart disease and other diseases of the circulatory system: Secondary | ICD-10-CM | POA: Diagnosis not present

## 2021-07-07 DIAGNOSIS — K219 Gastro-esophageal reflux disease without esophagitis: Secondary | ICD-10-CM

## 2021-07-07 DIAGNOSIS — R7401 Elevation of levels of liver transaminase levels: Secondary | ICD-10-CM

## 2021-07-07 DIAGNOSIS — Z885 Allergy status to narcotic agent status: Secondary | ICD-10-CM | POA: Diagnosis not present

## 2021-07-07 DIAGNOSIS — D649 Anemia, unspecified: Secondary | ICD-10-CM | POA: Diagnosis present

## 2021-07-07 DIAGNOSIS — Z8261 Family history of arthritis: Secondary | ICD-10-CM | POA: Diagnosis not present

## 2021-07-07 DIAGNOSIS — I700 Atherosclerosis of aorta: Secondary | ICD-10-CM | POA: Diagnosis not present

## 2021-07-07 DIAGNOSIS — Z79899 Other long term (current) drug therapy: Secondary | ICD-10-CM | POA: Diagnosis not present

## 2021-07-07 DIAGNOSIS — C250 Malignant neoplasm of head of pancreas: Secondary | ICD-10-CM | POA: Diagnosis present

## 2021-07-07 DIAGNOSIS — R935 Abnormal findings on diagnostic imaging of other abdominal regions, including retroperitoneum: Secondary | ICD-10-CM

## 2021-07-07 DIAGNOSIS — D259 Leiomyoma of uterus, unspecified: Secondary | ICD-10-CM | POA: Diagnosis present

## 2021-07-07 HISTORY — PX: ERCP: SHX5425

## 2021-07-07 LAB — COMPLETE METABOLIC PANEL WITH GFR
AG Ratio: 1.1 (calc) (ref 1.0–2.5)
ALT: 227 U/L — ABNORMAL HIGH (ref 6–29)
AST: 171 U/L — ABNORMAL HIGH (ref 10–35)
Albumin: 4 g/dL (ref 3.6–5.1)
Alkaline phosphatase (APISO): 835 U/L — ABNORMAL HIGH (ref 37–153)
BUN: 9 mg/dL (ref 7–25)
CO2: 28 mmol/L (ref 20–32)
Calcium: 10 mg/dL (ref 8.6–10.4)
Chloride: 103 mmol/L (ref 98–110)
Creat: 0.78 mg/dL (ref 0.60–1.00)
Globulin: 3.6 g/dL (calc) (ref 1.9–3.7)
Glucose, Bld: 131 mg/dL — ABNORMAL HIGH (ref 65–99)
Potassium: 3.6 mmol/L (ref 3.5–5.3)
Sodium: 139 mmol/L (ref 135–146)
Total Bilirubin: 11.7 mg/dL — ABNORMAL HIGH (ref 0.2–1.2)
Total Protein: 7.6 g/dL (ref 6.1–8.1)
eGFR: 77 mL/min/{1.73_m2} (ref >=60)

## 2021-07-07 LAB — URINE CULTURE
MICRO NUMBER:: 13183875
Result:: NO GROWTH
SPECIMEN QUALITY:: ADEQUATE

## 2021-07-07 LAB — CBC WITH DIFFERENTIAL/PLATELET
Absolute Monocytes: 466 cells/uL (ref 200–950)
Basophils Absolute: 11 cells/uL (ref 0–200)
Basophils Relative: 0.3 %
Eosinophils Absolute: 42 cells/uL (ref 15–500)
Eosinophils Relative: 1.2 %
HCT: 34 % — ABNORMAL LOW (ref 35.0–45.0)
Hemoglobin: 11.3 g/dL — ABNORMAL LOW (ref 11.7–15.5)
Lymphs Abs: 1162 cells/uL (ref 850–3900)
MCH: 28.8 pg (ref 27.0–33.0)
MCHC: 33.2 g/dL (ref 32.0–36.0)
MCV: 86.5 fL (ref 80.0–100.0)
MPV: 13.9 fL — ABNORMAL HIGH (ref 7.5–12.5)
Monocytes Relative: 13.3 %
Neutro Abs: 1820 cells/uL (ref 1500–7800)
Neutrophils Relative %: 52 %
Platelets: 249 10*3/uL (ref 140–400)
RBC: 3.93 10*6/uL (ref 3.80–5.10)
RDW: 14.7 % (ref 11.0–15.0)
Total Lymphocyte: 33.2 %
WBC: 3.5 10*3/uL — ABNORMAL LOW (ref 3.8–10.8)

## 2021-07-07 LAB — PROTIME-INR
INR: 0.9
INR: 1 (ref 0.8–1.2)
Prothrombin Time: 9.6 s (ref 9.0–11.5)
Prothrombin Time: 13 seconds (ref 11.4–15.2)

## 2021-07-07 LAB — BILIRUBIN, FRACTIONATED(TOT/DIR/INDIR)
Bilirubin, Direct: 7.1 mg/dL — ABNORMAL HIGH (ref 0.0–0.2)
Indirect Bilirubin: 4.6 mg/dL (calc) — ABNORMAL HIGH (ref 0.2–1.2)
Total Bilirubin: 11.7 mg/dL — ABNORMAL HIGH (ref 0.2–1.2)

## 2021-07-07 LAB — URINALYSIS, MICROSCOPIC ONLY
Bacteria, UA: NONE SEEN /HPF
Hyaline Cast: NONE SEEN /LPF
Squamous Epithelial / HPF: NONE SEEN /HPF (ref <=5)
WBC, UA: NONE SEEN /HPF (ref 0–5)

## 2021-07-07 LAB — TYPE AND SCREEN
ABO/RH(D): A POS
Antibody Screen: NEGATIVE

## 2021-07-07 LAB — LIPASE, BLOOD: Lipase: 60 U/L — ABNORMAL HIGH (ref 11–51)

## 2021-07-07 SURGERY — ERCP, WITH INTERVENTION IF INDICATED
Anesthesia: General

## 2021-07-07 MED ORDER — LACTATED RINGERS IV SOLN: INTRAVENOUS | Status: DC

## 2021-07-07 MED ORDER — IOHEXOL 300 MG/ML  SOLN
100.0000 mL | Freq: Once | INTRAMUSCULAR | Status: AC | PRN
  Administered 2021-07-07: 100 mL via INTRAVENOUS

## 2021-07-07 MED ORDER — INDOMETHACIN 50 MG RE SUPP
RECTAL | Status: AC
  Filled 2021-07-07: qty 2

## 2021-07-07 MED ORDER — PROPOFOL 10 MG/ML IV BOLUS
INTRAVENOUS | Status: DC | PRN
Start: 2021-07-07 — End: 2021-07-07
  Administered 2021-07-07: 20 mg via INTRAVENOUS

## 2021-07-07 MED ORDER — ACETAMINOPHEN 650 MG RE SUPP: 650.0000 mg | Freq: Four times a day (QID) | RECTAL | Status: DC | PRN

## 2021-07-07 MED ORDER — SODIUM CHLORIDE 0.9 % IV SOLN: INTRAVENOUS | Status: DC

## 2021-07-07 MED ORDER — ACETAMINOPHEN 325 MG PO TABS: 650.0000 mg | ORAL_TABLET | Freq: Four times a day (QID) | ORAL | Status: DC | PRN

## 2021-07-07 MED ORDER — MIDAZOLAM HCL 2 MG/2ML IJ SOLN
INTRAMUSCULAR | Status: DC | PRN
  Administered 2021-07-07: 1 mg via INTRAVENOUS

## 2021-07-07 MED ORDER — MORPHINE SULFATE (PF) 2 MG/ML IV SOLN: 2.0000 mg | INTRAVENOUS | Status: DC | PRN

## 2021-07-07 MED ORDER — ONDANSETRON HCL 4 MG PO TABS: 4.0000 mg | ORAL_TABLET | Freq: Four times a day (QID) | ORAL | Status: DC | PRN

## 2021-07-07 MED ORDER — PROPOFOL 500 MG/50ML IV EMUL
INTRAVENOUS | Status: DC | PRN
  Administered 2021-07-07: 200 ug/kg/min via INTRAVENOUS

## 2021-07-07 MED ORDER — PIPERACILLIN-TAZOBACTAM 3.375 G IVPB
3.3750 g | Freq: Three times a day (TID) | INTRAVENOUS | Status: DC
  Administered 2021-07-07 – 2021-07-08 (×4): 3.375 g via INTRAVENOUS
  Filled 2021-07-07 (×4): qty 50

## 2021-07-07 MED ORDER — MIDAZOLAM HCL 2 MG/2ML IJ SOLN
INTRAMUSCULAR | Status: AC
  Filled 2021-07-07: qty 2

## 2021-07-07 MED ORDER — INDOMETHACIN 50 MG RE SUPP
100.0000 mg | Freq: Once | RECTAL | Status: AC
  Administered 2021-07-07: 100 mg via RECTAL

## 2021-07-07 MED ORDER — HYDRALAZINE HCL 20 MG/ML IJ SOLN
10.0000 mg | INTRAMUSCULAR | Status: DC | PRN
  Administered 2021-07-07 (×2): 10 mg via INTRAVENOUS
  Filled 2021-07-07 (×2): qty 1

## 2021-07-07 MED ORDER — ONDANSETRON HCL 4 MG/2ML IJ SOLN: 4.0000 mg | Freq: Four times a day (QID) | INTRAMUSCULAR | Status: DC | PRN

## 2021-07-07 MED ORDER — LACTATED RINGERS IV SOLN
INTRAVENOUS | Status: DC
Start: 2021-07-07 — End: 2021-07-07
  Administered 2021-07-07: 1000 mL via INTRAVENOUS

## 2021-07-07 NOTE — Anesthesia Postprocedure Evaluation (Signed)
Anesthesia Post Note ? ?Patient: Meredith Wilcox ? ?Procedure(s) Performed: ENDOSCOPIC RETROGRADE CHOLANGIOPANCREATOGRAPHY (ERCP) ? ?Patient location during evaluation: Endoscopy ?Anesthesia Type: General ?Level of consciousness: awake and alert ?Pain management: pain level controlled ?Vital Signs Assessment: post-procedure vital signs reviewed and stable ?Respiratory status: spontaneous breathing, nonlabored ventilation, respiratory function stable and patient connected to nasal cannula oxygen ?Cardiovascular status: blood pressure returned to baseline and stable ?Postop Assessment: no apparent nausea or vomiting ?Anesthetic complications: no ? ? ?No notable events documented. ? ? ?Last Vitals:  ?Vitals:  ? 07/07/21 1723 07/07/21 1755  ?BP: (!) 148/59 (!) 132/50  ?Pulse: 71 70  ?Resp: 18 16  ?Temp:  37.1 ?C  ?SpO2: 100% 100%  ?  ?Last Pain:  ?Vitals:  ? 07/07/21 1755  ?TempSrc: Oral  ?PainSc: 0-No pain  ? ? ?  ?  ?  ?  ?  ?  ? ?Martha Clan ? ? ? ? ?

## 2021-07-07 NOTE — Progress Notes (Signed)
Admission profile updated. ?

## 2021-07-07 NOTE — Assessment & Plan Note (Signed)
Likely undiagnosed hypertension ?As needed IV hydralazine while n.p.o. ?

## 2021-07-07 NOTE — Op Note (Signed)
[facility] ?Gastroenterology ?Patient Name: Meredith Wilcox ?Procedure Date: 07/07/2021 3:49 PM ?MRN: [id number] ?Account #: [id number] ?Date of Birth: Oct 15, 1940 ?Admit Type: Outpatient ?Age: 81 ?Room: [facility] ENDO ROOM 4 ?Gender: Female ?Note Status: Finalized ?Instrument Name: [id number] ?Procedure:             ERCP ?Indications:           Tumor of the head of pancreas ?Providers:             [name] MD, MD ?Medicines:             Propofol per Anesthesia ?Complications:         No immediate complications. ?Procedure:             Pre-Anesthesia Assessment: ?                       - Prior to the procedure, a History and Physical was  ?                       performed, and patient medications and allergies were  ?                       reviewed. The patient's tolerance of previous  ?                       anesthesia was also reviewed. The risks and benefits  ?                       of the procedure and the sedation options and risks  ?                       were discussed with the patient. All questions were  ?                       answered, and informed consent was obtained. Prior  ?                       Anticoagulants: The patient has taken no previous  ?                       anticoagulant or antiplatelet agents. ASA Grade  ?                       Assessment: II - A patient with mild systemic disease.  ?                       After reviewing the risks and benefits, the patient  ?                       was deemed in satisfactory condition to undergo the  ?                       procedure. ?                       After obtaining informed consent, the scope was passed  ?                       under direct vision. Throughout the procedure, the  ?  patient's blood pressure, pulse, and oxygen  ?                       saturations were monitored continuously. The  ?                       Duodenoscope was introduced through the mouth, and  ?                       used to inject  contrast into and used to inject  ?                       contrast into the bile duct. The ERCP was accomplished  ?                       without difficulty. The patient tolerated the  ?                       procedure well. ?Findings: ?     The scout film was normal. The esophagus was successfully intubated  ?     under direct vision. The scope was advanced to a normal major papilla in  ?     the descending duodenum without detailed examination of the pharynx,  ?     larynx and associated structures, and upper GI tract. The upper GI tract  ?     was grossly normal. The bile duct was deeply cannulated with the  ?     short-nosed traction sphincterotome. Contrast was injected. I personally  ?     interpreted the bile duct images. There was brisk flow of contrast  ?     through the ducts. Image quality was excellent. Contrast extended to the  ?     hepatic ducts. The lower third of the main bile duct and middle third of  ?     the main bile duct were markedly dilated and diffusely dilated. A wire  ?     was passed into the biliary tree. A 10 mm biliary sphincterotomy was  ?     made with a traction (standard) sphincterotome using ERBE  ?     electrocautery. There was no post-sphincterotomy bleeding. One 10 Fr by  ?     9 cm plastic stent with a single external flap and a single internal  ?     flap was placed 8 cm into the common bile duct. Bile flowed through the  ?     stent. The stent was in good position. ?Impression:            - The middle third of the main bile duct and lower  ?                       third of the main bile duct were markedly dilated. ?                       - A biliary sphincterotomy was performed. ?                       - One plastic stent was placed into the common bile  ?  duct. ?Recommendation:        - Return patient to hospital ward for ongoing care. ?                       - Clear liquid diet today. ?                       - Return to this GI lab for stent exchange  at ERCP in  ?                       3 months. ?Procedure Code(s):     --- Professional --- ?                       [id number], Endoscopic retrograde cholangiopancreatography  ?                       (ERCP); with placement of endoscopic stent into  ?                       biliary or pancreatic duct, including pre- and  ?                       post-dilation and guide wire passage, when performed,  ?                       including sphincterotomy, when performed, each stent ?                       74328, Endoscopic catheterization of the biliary  ?                       ductal system, radiological supervision and  ?                       interpretation ?Diagnosis Code(s):     --- Professional --- ?                       D49.0, Neoplasm of unspecified behavior of digestive  ?                       system ?                       K83.8, Other specified diseases of biliary tract ?CPT copyright 2019 [employer]. All rights reserved. ?The codes documented in this report are preliminary and upon coder review may  ?be revised to meet current compliance requirements. ?[name] MD, MD ?07/07/2021 4:32:29 PM ?This report has been signed electronically. ?Number of Addenda: 0 ?Note Initiated On: 07/07/2021 3:49 PM ?Estimated Blood Loss:  Estimated blood loss: none. ?     [facility] ?

## 2021-07-07 NOTE — Assessment & Plan Note (Signed)
GI consult for ERCP ?

## 2021-07-07 NOTE — Anesthesia Preprocedure Evaluation (Signed)
Anesthesia Evaluation  ?Patient identified by MRN, date of birth, ID band ?Patient awake ? ? ? ?Reviewed: ?Allergy & Precautions, H&P , NPO status , Patient's Chart, lab work & pertinent test results ? ?History of Anesthesia Complications ?Negative for: history of anesthetic complications ? ?Airway ?Mallampati: III ? ?TM Distance: <3 FB ?Neck ROM: limited ? ? ? Dental ? ?(+) Poor Dentition, Chipped, Missing, Upper Dentures, Dental Advidsory Given ?  ?Pulmonary ?neg shortness of breath, neg COPD, Recent URI  (bronchitis recently, but cleared), Resolved,  ?  ? ? ? ? ? ? ? Cardiovascular ?Exercise Tolerance: Good ?(-) hypertension(-) angina(-) Past MI, (-) Cardiac Stents and (-) DOE + dysrhythmias Supra Ventricular Tachycardia (-) Valvular Problems/Murmurs ? ? ?  ?Neuro/Psych ?neg Seizures  Neuromuscular disease negative psych ROS  ? GI/Hepatic ?Neg liver ROS, GERD  Medicated and Controlled,  ?Endo/Other  ?negative endocrine ROS ? Renal/GU ?Renal disease  ? ?  ?Musculoskeletal ? ?(+) Arthritis ,  ? Abdominal ?  ?Peds ? Hematology ?negative hematology ROS ?(+)   ?Anesthesia Other Findings ?Past Medical History: ?No date: Anemia ?    Comment:  H/O DURING PREGNANCY ?No date: Arthritis ?No date: Chronic kidney disease ?    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    Comment:  PT STATES THAT SHE FEELS LIKE OCC HER HEART STARTS  ?             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 ?Past Surgical History: ?10/11/2016: BREAST BIOPSY; Left ?    Comment:  left breast stereo/ VASCULAR LESION ?12/02/2016: BREAST EXCISIONAL BIOPSY; Left ?    Comment:  left lumpectomy ?No date:  DILATION AND CURETTAGE OF UTERUS ?No date: dnc ?    Comment:  1983 ?No date: fatty tumor removal; Right ?    Comment:  hand ? ?BMI   ? Body Mass Index:  24.96 kg/m?  ?  ? ? Reproductive/Obstetrics ?negative OB ROS ? ?  ? ? ? ? ? ? ? ? ? ? ? ? ? ?  ?  ? ? ? ? ? ? ? ? ?Anesthesia Physical ? ?Anesthesia Plan ? ?ASA: III ? ?Anesthesia Plan: General  ? ?Post-op Pain Management:   ? ?Induction: Intravenous ? ?PONV Risk Score and Plan: 3 and Propofol infusion and TIVA ? ?Airway Management Planned: Natural Airway and Nasal Cannula ? ?Additional Equipment:  ? ?Intra-op Plan:  ? ?Post-operative Plan:  ? ?Informed Consent: I have reviewed the patients History and Physical, chart, labs and discussed the procedure including the risks, benefits and alternatives for the proposed anesthesia with the patient or authorized representative who has indicated his/her understanding and acceptance.  ? ? ? ?Dental Advisory Given ? ?Plan Discussed with: Anesthesiologist, CRNA and Surgeon ? ?Anesthesia Plan Comments: (Patient consented for risks of anesthesia including but not limited to:  ?- adverse reactions to medications ?- damage to teeth, lips or other oral mucosa ?- sore throat or hoarseness ?- Damage to heart, brain, lungs or loss of life ? ?Patient voiced understanding.)  ? ? ? ? ? ? ?Anesthesia Quick Evaluation ? ?

## 2021-07-07 NOTE — Consult Note (Signed)
? ? Inpatient Consultation ?  ?Patient ID: Meredith Wilcox is a 81 y.o. female. ? ?Requesting Provider: Judd Gaudier, MD ? ?Date of Admission: 07/07/2021  ?Date of Consult: 07/07/21  ? ?Reason for Consultation: Painless Jaundice  ? ?Patient's Chief Complaint:   ?Chief Complaint  ?Patient presents with  ? Abnormal Lab  ? ? ?81 year old female with history of H. pylori gastritis, CKD, hypertension, hyperlipidemia who presents to the emergency department at the direction of her PCP for abnormal labs.  Gastroenterology is consulted for painless jaundice. ? ?Patient had been in her normal state of health when over the last 2 weeks she noted darkening urine and pale-colored stools.  She presented to her primary care doctor and was noted to have elevated LFTs including hyperbilirubinemia.  Patient denies changes in appetite.  She has noted a few pounds of weight loss.  Denies any night sweats or abdominal pain, constipation diarrhea melena or hematochezia.  On presentation her total bilirubin was 12.6 ALT 241 AST 194 alk phos a 29.  INR is 1.0 and she is not encephalopathic. ? ?She denies fever or chills.  No reflux dysphagia or odynophagia.  She did not notice yellowing of her skin until approximately yesterday.  Had not previously noted discoloration of her eyes either. ? ?A CT with contrast was performed demonstrating a 15 mm hypodense lesion in the pancreatic head with abrupt CBD tapering.  There is diffuse intrahepatic dilation as well.  Gallbladder is distended without evidence of stones and there is no hepatic mass.  The pancreas body and tail appear to be normal.  Stomach and bowel also appeared normal aside from colonic diverticulosis. ? ?No history of tobacco use ? ?Denies NSAIDs, Anti-plt agents, and anticoagulants ?Denies family history of gastrointestinal disease and malignancy ?Previous Endoscopies: ?04/12/2010- colonoscopy- normal ?No egd ? ? ? ?Past Medical History:  ?Diagnosis Date  ? Anemia   ? H/O DURING  PREGNANCY  ? Arthritis   ? Chronic kidney disease   ? KIDNEY PROBLEMS AROUND AGE 30  ? Geographic tongue   ? GERD (gastroesophageal reflux disease)   ? H/O NO MEDS  ? Helicobacter pylori gastrointestinal tract infection   ? Hyperlipidemia   ? Incontinence   ? Indigestion   ? Prolapse of vaginal walls   ? Right sided sciatica   ? Tachycardia   ? Urethral prolapse   ? ? ?Past Surgical History:  ?Procedure Laterality Date  ? BREAST BIOPSY Left 10/11/2016  ? left breast stereo/ VASCULAR LESION  ? BREAST BIOPSY Left 12/02/2016  ? Procedure: NEEDLE LOCALIZATION;  Surgeon: [name], MD;  Location: [facility] ORS;  Service: General;  Laterality: Left;  ? BREAST EXCISIONAL BIOPSY Left 12/02/2016  ? left lumpectomy  ? BREAST LUMPECTOMY Left 12/02/2016  ? Procedure: EXCISION BREAST MASS;  Surgeon: [name], MD;  Location: [facility] ORS;  Service: General;  Laterality: Left;  ? DILATION AND CURETTAGE OF UTERUS    ? dnc    ? 26  ? fatty tumor removal Right   ? hand  ? ? ?Allergies  ?Allergen Reactions  ? Tussionex Pennkinetic Er [Hydrocod Poli-Chlorphe Poli Er] Other (See Comments)  ?  Real dizzy headed even after a while still walking every direction but straight, patient stated she stopped taking it because of that.  ? Hydrocodone   ?  Light headed and dizziness  ? ? ?Family History  ?Problem Relation Age of Onset  ? Diabetes Mother   ? Hypertension Mother   ?  Cardiomyopathy Sister   ? Down syndrome Sister   ? Leukemia Brother   ? Alcohol abuse Son   ?     youngest son  ? Asthma Son   ?     youngest son  ? Rheum arthritis Sister   ? Drug abuse Child 23  ? Breast cancer Neg Hx   ? Kidney cancer Neg Hx   ? Bladder Cancer Neg Hx   ? Prostate cancer Neg Hx   ? ? ?Social History  ? ?Tobacco Use  ? Smoking status: Never  ? Smokeless tobacco: Never  ? Tobacco comments:  ?  smoking cessation materials not required  ?Vaping Use  ? Vaping Use: Never used  ?Substance Use Topics  ? Alcohol use: No  ?  Alcohol/week: 0.0  standard drinks  ? Drug use: No  ?  ? ?Pertinent GI related history and allergies were reviewed with the patient ? ?Review of Systems  ?Constitutional:  Negative for activity change, appetite change, chills, diaphoresis, fatigue, fever and unexpected weight change.  ?HENT:  Negative for trouble swallowing and voice change.   ?Respiratory:  Negative for shortness of breath and wheezing.   ?Cardiovascular:  Negative for chest pain, palpitations and leg swelling.  ?Gastrointestinal:  Negative for abdominal distention, abdominal pain, anal bleeding, blood in stool, constipation, diarrhea, nausea, rectal pain and vomiting.  ?     Positive for acholic stools  ?Genitourinary:   ?     Dark urine  ?Musculoskeletal:  Negative for arthralgias and myalgias.  ?Skin:  Positive for color change. Negative for pallor.  ?Neurological:  Negative for dizziness, syncope and weakness.  ?Psychiatric/Behavioral:  Negative for confusion.   ?All other systems reviewed and are negative.  ? ?Medications ?Home Medications ?No current facility-administered medications on file prior to encounter.  ? ?Current Outpatient Medications on File Prior to Encounter  ?Medication Sig Dispense Refill  ? Ascorbic Acid (VITAMIN C) 1000 MG tablet Take 1,000 mg by mouth daily.    ? Cholecalciferol (VITAMIN D) 2000 UNITS tablet Take 2,000 Units by mouth daily. Reported on 06/04/2015    ? Multiple Vitamins-Minerals ([name] PO) Take 1 tablet by mouth daily.     ? Omega-3 Fatty Acids (OMEGA-3 PO) Take 1 capsule by mouth 2 (two) times daily. Once to twice daily (pt sometimes forgets 2nd dose)    ? TURMERIC PO Take 1 capsule by mouth daily.     ? azithromycin (ZITHROMAX) 250 MG tablet Take 1-2 tablets (250-500 mg total) by mouth daily. Take 2 tablets on day 1, then 1 tablet daily on days 2 through 5 (Patient not taking: Reported on 07/07/2021) 6 tablet 0  ? conjugated estrogens (PREMARIN) vaginal cream Apply pea size nightly (Patient not taking: Reported on  07/07/2021) 30 g 12  ? predniSONE (DELTASONE) 10 MG tablet Take 1 tablet (10 mg total) by mouth 2 (two) times daily with a meal. (Patient not taking: Reported on 07/07/2021) 10 tablet 0  ? ?Pertinent GI related medications were reviewed with the patient ? ?Inpatient Medications ? ?Current Facility-Administered Medications:  ?  acetaminophen (TYLENOL) tablet 650 mg, 650 mg, Oral, Q6H PRN **OR** acetaminophen (TYLENOL) suppository 650 mg, 650 mg, Rectal, Q6H PRN, Athena Masse, MD ?  hydrALAZINE (APRESOLINE) injection 10 mg, 10 mg, Intravenous, Q4H PRN, Athena Masse, MD ?  lactated ringers infusion, , Intravenous, Continuous, Alfred Levins, [place], MD, Last Rate: 125 mL/hr at 07/07/21 0331, New Bag at 07/07/21 0331 ?  morphine (PF) 2  MG/ML injection 2 mg, 2 mg, Intravenous, Q2H PRN, Athena Masse, MD ?  ondansetron (ZOFRAN) tablet 4 mg, 4 mg, Oral, Q6H PRN **OR** ondansetron (ZOFRAN) injection 4 mg, 4 mg, Intravenous, Q6H PRN, Athena Masse, MD ? ?Current Outpatient Medications:  ?  Ascorbic Acid (VITAMIN C) 1000 MG tablet, Take 1,000 mg by mouth daily., Disp: , Rfl:  ?  Cholecalciferol (VITAMIN D) 2000 UNITS tablet, Take 2,000 Units by mouth daily. Reported on 06/04/2015, Disp: , Rfl:  ?  Multiple Vitamins-Minerals ([name] PO), Take 1 tablet by mouth daily. , Disp: , Rfl:  ?  Omega-3 Fatty Acids (OMEGA-3 PO), Take 1 capsule by mouth 2 (two) times daily. Once to twice daily (pt sometimes forgets 2nd dose), Disp: , Rfl:  ?  TURMERIC PO, Take 1 capsule by mouth daily. , Disp: , Rfl:  ?  azithromycin (ZITHROMAX) 250 MG tablet, Take 1-2 tablets (250-500 mg total) by mouth daily. Take 2 tablets on day 1, then 1 tablet daily on days 2 through 5 (Patient not taking: Reported on 07/07/2021), Disp: 6 tablet, Rfl: 0 ?  conjugated estrogens (PREMARIN) vaginal cream, Apply pea size nightly (Patient not taking: Reported on 07/07/2021), Disp: 30 g, Rfl: 12 ?  predniSONE (DELTASONE) 10 MG tablet, Take 1 tablet (10 mg  total) by mouth 2 (two) times daily with a meal. (Patient not taking: Reported on 07/07/2021), Disp: 10 tablet, Rfl: 0 ? lactated ringers 125 mL/hr at 07/07/21 0331  ?  ?acetaminophen **OR** acetaminophen, hydrALAZ

## 2021-07-07 NOTE — Assessment & Plan Note (Signed)
IV Protonix as needed while n.p.o. ?

## 2021-07-07 NOTE — H&P (Signed)
?History and Physical  ? ? ?Patient: Meredith Wilcox VOH:[id number] DOB: 06/07/1940 ?DOA: 07/07/2021 ?DOS: the patient was seen and examined on 07/07/2021 ?PCP: [name], MD  ?Patient coming from: Home ? ?Chief Complaint:  ?Chief Complaint  ?Patient presents with  ? Abnormal Lab  ? ? ?HPI: Meredith Wilcox is a 81 y.o. female with medical history significant for Arthritis, pelvic floor dysfunction, GERD with prior positive H. pylori who was sent by her PCP with abnormal liver function test.  Patient initially presented because she noticed that for the last 10 days her urine was tea colored and her stool was like a light-colored chalk.  She denies abdominal pain, nausea or vomiting.  She does have some indigestion that is not beyond her baseline.  Denies unintentional weight loss.  Appetite is essentially unchanged. ?ED course: BP 206/76, temp 99 with otherwise normal vitals blood work significant for hemoglobin of 11.5 but mostly for abnormal liver function with AST/ALT of 194/241, alk phos 829, total bili 12.6, direct 7.7 and indirect 4.8.  Urine was amber-colored.  CT abdomen and pelvis with contrast showed the following: ?IMPRESSION: ?Diffuse intrahepatic and extrahepatic biliary ductal dilatation with ?abrupt tapering in the region of the head of the pancreas. Adjacent ?to this there is a 15 mm hypodense lesion within the head of the ?pancreas suspicious for pancreatic neoplasm. ERCP with tissue ?sampling as well as EUS may be helpful for further diagnosis. ?  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 ?  ?Uterine fibroid ? ? ?Patient placed NPO.  Hospitalist consulted for admission.  ?The ED provider discussed findings with patient's children on the telephone and at bedside ? ?Review of Systems: As mentioned in the history of present illness. All other systems reviewed and are negative. ?Past Medical History:  ?Diagnosis Date  ? Anemia   ? H/O DURING PREGNANCY  ? Arthritis   ? Chronic kidney disease   ?  KIDNEY PROBLEMS AROUND AGE 21  ? Geographic tongue   ? GERD (gastroesophageal reflux disease)   ? H/O NO MEDS  ? Helicobacter pylori gastrointestinal tract infection   ? Hyperlipidemia   ? Incontinence   ? Indigestion   ? Prolapse of vaginal walls   ? Right sided sciatica   ? Tachycardia   ? Urethral prolapse   ? ?Past Surgical History:  ?Procedure Laterality Date  ? BREAST BIOPSY Left 10/11/2016  ? left breast stereo/ VASCULAR LESION  ? BREAST BIOPSY Left 12/02/2016  ? Procedure: NEEDLE LOCALIZATION;  Surgeon: [name], MD;  Location: [facility] ORS;  Service: General;  Laterality: Left;  ? BREAST EXCISIONAL BIOPSY Left 12/02/2016  ? left lumpectomy  ? BREAST LUMPECTOMY Left 12/02/2016  ? Procedure: EXCISION BREAST MASS;  Surgeon: [name], MD;  Location: [facility] ORS;  Service: General;  Laterality: Left;  ? DILATION AND CURETTAGE OF UTERUS    ? dnc    ? 80  ? fatty tumor removal Right   ? hand  ? ?Social History:  reports that she has never smoked. She has never used smokeless tobacco. She reports that she does not drink alcohol and does not use drugs. ? ?Allergies  ?Allergen Reactions  ? Tussionex Pennkinetic Er [Hydrocod Poli-Chlorphe Poli Er] Other (See Comments)  ?  Real dizzy headed even after a while still walking every direction but straight, patient stated she stopped taking it because of that.  ? Hydrocodone   ?  Light headed and dizziness  ? ? ?Family History  ?Problem Relation  Age of Onset  ? Diabetes Mother   ? Hypertension Mother   ? Cardiomyopathy Sister   ? Down syndrome Sister   ? Leukemia Brother   ? Alcohol abuse Son   ?     youngest son  ? Asthma Son   ?     youngest son  ? Rheum arthritis Sister   ? Drug abuse Child 23  ? Breast cancer Neg Hx   ? Kidney cancer Neg Hx   ? Bladder Cancer Neg Hx   ? Prostate cancer Neg Hx   ? ? ?Prior to Admission medications   ?Medication Sig Start Date End Date Taking? Authorizing Provider  ?Ascorbic Acid (VITAMIN C) 1000 MG tablet Take 1,000  mg by mouth daily.   Yes [provider]  ?Cholecalciferol (VITAMIN D) 2000 UNITS tablet Take 2,000 Units by mouth daily. Reported on 06/04/2015 12/30/10  Yes [provider]  ?Multiple Vitamins-Minerals ([name] PO) Take 1 tablet by mouth daily.    Yes [provider]  ?Omega-3 Fatty Acids (OMEGA-3 PO) Take 1 capsule by mouth 2 (two) times daily. Once to twice daily (pt sometimes forgets 2nd dose)   Yes [provider]  ?TURMERIC PO Take 1 capsule by mouth daily.    Yes [provider]  ?azithromycin (ZITHROMAX) 250 MG tablet Take 1-2 tablets (250-500 mg total) by mouth daily. Take 2 tablets on day 1, then 1 tablet daily on days 2 through 5 ?Patient not taking: Reported on 07/07/2021 05/19/21   [name], MD  ?conjugated estrogens (PREMARIN) vaginal cream Apply pea size nightly ?Patient not taking: Reported on 07/07/2021 02/05/19   Zara Council A, PA-C  ?predniSONE (DELTASONE) 10 MG tablet Take 1 tablet (10 mg total) by mouth 2 (two) times daily with a meal. ?Patient not taking: Reported on 07/07/2021 05/19/21   [name], MD  ? ? ?Physical Exam: ?Vitals:  ? 07/06/21 2052 07/07/21 0100 07/07/21 0330  ?BP: (!) 206/76 (!) 168/66 (!) 195/79  ?Pulse: 79 70 73  ?Resp: 17    ?Temp: 99 ?F (37.2 ?C)    ?TempSrc: Oral    ?SpO2: 100% 93% 100%  ? ?Physical Exam ?Vitals and nursing note reviewed.  ?Constitutional:   ?   General: She is not in acute distress. ?HENT:  ?   Head: Normocephalic and atraumatic.  ?Eyes:  ?   Comments: jaundice  ?Cardiovascular:  ?   Rate and Rhythm: Normal rate and regular rhythm.  ?   Pulses: Normal pulses.  ?   Heart sounds: Normal heart sounds.  ?Pulmonary:  ?   Effort: Pulmonary effort is normal.  ?   Breath sounds: Normal breath sounds.  ?Abdominal:  ?   Palpations: Abdomen is soft.  ?   Tenderness: There is no abdominal tenderness.  ?Neurological:  ?   Mental Status: Mental status is at baseline.  ? ? ? ?Data Reviewed: ?Relevant notes  from primary care and specialist visits, past discharge summaries as available in EHR, including Care Everywhere. ?Prior diagnostic testing as pertinent to current admission diagnoses ?Updated medications and problem lists for reconciliation ?ED course, including vitals, labs, imaging, treatment and response to treatment ?Triage notes, nursing and pharmacy notes and ED provider's notes ?Notable results as noted in HPI ? ? ?Assessment and Plan: ?* Obstructive jaundice ?GI consult for ERCP ? ?Elevated blood pressure reading with diagnosis of hypertension ?Likely undiagnosed hypertension ?As needed IV hydralazine while n.p.o. ? ?GERD (gastroesophageal reflux disease) ?IV Protonix as  needed while n.p.o. ? ?Abnormal CT of the pancreas ?Findings concerning for pancreatic neoplasm ?Findings discussed with patient ? ? ? ? ? ? ?Advance Care Planning:   Code Status: Not on file  ? ?Consults:, Dr. Virgina Jock, GI ? ?Family Communication: none ? ?Severity of Illness: ?The appropriate patient status for this patient is INPATIENT. Inpatient status is judged to be reasonable and necessary in order to provide the required intensity of service to ensure the patient's safety. The patient's presenting symptoms, physical exam findings, and initial radiographic and laboratory data in the context of their chronic comorbidities is felt to place them at high risk for further clinical deterioration. Furthermore, it is not anticipated that the patient will be medically stable for discharge from the hospital within 2 midnights of admission.  ? ?* I certify that at the point of admission it is my clinical judgment that the patient will require inpatient hospital care spanning beyond 2 midnights from the point of admission due to high intensity of service, high risk for further deterioration and high frequency of surveillance required.* ? ?Author: ?Athena Masse, MD ?07/07/2021 3:49 AM ? ?For on call review [email or web address].  ?

## 2021-07-07 NOTE — ED Provider Notes (Signed)
? ?[facility] ?Provider Note ? ? ? Event Date/Time  ? First MD Initiated Contact with Patient 07/07/21 0020   ?  (approximate) ? ? ?History  ? ?Abnormal Lab ? ? ?HPI ? ?Meredith Wilcox is a 81 y.o. female with a history of anemia, chronic kidney disease, hyperlipidemia who was sent to the ED by her primary care doctor for abnormal labs.  Patient noticed that over the last 10 days her urine has been tea colored and her stool has been colored chalk.  She schedule an appointment with her primary care doctor and was seen yesterday.  She received a phone call that her labs were abnormal recommended that she come to the ER for evaluation.  More specific her LFTs, T. bili were extremely elevated.  Patient denies any abdominal pain, night sweats, unintentional weight loss, nausea, vomiting.  She denies any family history of pancreatic cancer or liver failure.  She denies any history of alcohol abuse. ?  ? ? ?Past Medical History:  ?Diagnosis Date  ? Anemia   ? H/O DURING PREGNANCY  ? Arthritis   ? Chronic kidney disease   ? KIDNEY PROBLEMS AROUND AGE 29  ? Geographic tongue   ? GERD (gastroesophageal reflux disease)   ? H/O NO MEDS  ? Helicobacter pylori gastrointestinal tract infection   ? Hyperlipidemia   ? Incontinence   ? Indigestion   ? Prolapse of vaginal walls   ? Right sided sciatica   ? Tachycardia   ? Urethral prolapse   ? ? ?Past Surgical History:  ?Procedure Laterality Date  ? BREAST BIOPSY Left 10/11/2016  ? left breast stereo/ VASCULAR LESION  ? BREAST BIOPSY Left 12/02/2016  ? Procedure: NEEDLE LOCALIZATION;  Surgeon: [name], MD;  Location: [facility] ORS;  Service: General;  Laterality: Left;  ? BREAST EXCISIONAL BIOPSY Left 12/02/2016  ? left lumpectomy  ? BREAST LUMPECTOMY Left 12/02/2016  ? Procedure: EXCISION BREAST MASS;  Surgeon: [name], MD;  Location: [facility] ORS;  Service: General;  Laterality: Left;  ? DILATION AND CURETTAGE OF UTERUS    ? dnc    ? 31  ?  fatty tumor removal Right   ? hand  ? ? ? ?Physical Exam  ? ?Triage Vital Signs: ?ED Triage Vitals [07/06/21 2052]  ?Enc Vitals Group  ?   BP (!) 206/76  ?   Pulse Rate 79  ?   Resp 17  ?   Temp 99 ?F (37.2 ?C)  ?   Temp Source Oral  ?   SpO2 100 %  ?   Weight   ?   Height   ?   Head Circumference   ?   Peak Flow   ?   Pain Score 0  ?   Pain Loc   ?   Pain Edu?   ?   Excl. in [place]?   ? ? ?Most recent vital signs: ?Vitals:  ? 07/06/21 2052 07/07/21 0100  ?BP: (!) 206/76 (!) 168/66  ?Pulse: 79 70  ?Resp: 17   ?Temp: 99 ?F (37.2 ?C)   ?SpO2: 100% 93%  ? ? ? ?Constitutional: Alert and oriented. Well appearing and in no apparent distress. ?HEENT: ?     Head: Normocephalic and atraumatic.    ?     Eyes: Conjunctivae are normal. Sclera is icteric.  ?     Mouth/Throat: Mucous membranes are moist.  ?     Neck: Supple with no signs of meningismus. ?  Cardiovascular: Regular rate and rhythm. No murmurs, gallops, or rubs. 2+ symmetrical distal pulses are present in all extremities.  ?Respiratory: Normal respiratory effort. Lungs are clear to auscultation bilaterally.  ?Gastrointestinal: Soft, non tender, and non distended with positive bowel sounds. No rebound or guarding. ?Genitourinary: No CVA tenderness. ?Musculoskeletal:  No edema, cyanosis, or erythema of extremities. ?Neurologic: Normal speech and language. Face is symmetric. Moving all extremities. No gross focal neurologic deficits are appreciated. ?Skin: Skin is warm, dry and intact. No rash noted. ?Psychiatric: Mood and affect are normal. Speech and behavior are normal. ? ?ED Results / Procedures / Treatments  ? ?Labs ?(all labs ordered are listed, but only abnormal results are displayed) ?Labs Reviewed  ?CBC WITH DIFFERENTIAL/PLATELET - Abnormal; Notable for the following components:  ?    Result Value  ? Hemoglobin 11.5 (*)   ? HCT 34.4 (*)   ? RDW 18.5 (*)   ? All other components within normal limits  ?URINALYSIS, ROUTINE W REFLEX MICROSCOPIC - Abnormal; Notable for  the following components:  ? Color, Urine AMBER (*)   ? APPearance CLEAR (*)   ? Bilirubin Urine SMALL (*)   ? All other components within normal limits  ?BASIC METABOLIC PANEL - Abnormal; Notable for the following components:  ? Potassium 3.4 (*)   ? Glucose, Bld 131 (*)   ? All other components within normal limits  ?HEPATIC FUNCTION PANEL - Abnormal; Notable for the following components:  ? Total Protein 8.7 (*)   ? AST 194 (*)   ? ALT 241 (*)   ? Alkaline Phosphatase 829 (*)   ? Total Bilirubin 12.6 (*)   ? Bilirubin, Direct 7.7 (*)   ? Indirect Bilirubin 4.9 (*)   ? All other components within normal limits  ?LIPASE, BLOOD - Abnormal; Notable for the following components:  ? Lipase 60 (*)   ? All other components within normal limits  ?PROTIME-INR  ?TYPE AND SCREEN  ? ? ? ?EKG ? ?none ? ? ?RADIOLOGY ?I, Rudene Re, attending MD, have personally viewed and interpreted the images obtained during this visit as below: ? ?CT showing a mass on the head of the pancreas with diffuse biliary ductal dilation ? ? ?___________________________________________________ ?Interpretation by Radiologist:  ?[place] ? ?Result Date: 07/07/2021 ?CLINICAL DATA:  Abdominal pain and elevated bilirubin, initial encounter EXAM: CT ABDOMEN AND PELVIS WITH CONTRAST TECHNIQUE: Multidetector CT imaging of the abdomen and pelvis was performed using the standard protocol following bolus administration of intravenous contrast. RADIATION DOSE REDUCTION: This exam was performed according to the departmental dose-optimization program which includes automated exposure control, adjustment of the mA and/or kV according to patient size and/or use of iterative reconstruction technique. CONTRAST:  165m OMNIPAQUE IOHEXOL 300 MG/ML  SOLN COMPARISON:  None. FINDINGS: Lower chest: No acute abnormality. Hepatobiliary: Gallbladder is well distended without definitive cholelithiasis. Diffuse intrahepatic biliary ductal dilatation is  seen. No hepatic mass is seen. Pancreas: Body and tail of the pancreas are within normal limits. The pancreatic head is mildly prominent and there is a focal 15 mm hypodense partially enhancing lesion in the head of the pancreas best seen on image number 40 of series 2. This lies adjacent to the abrupt tapering of the common bile duct in the pancreatic head and is suspicious for pancreatic neoplasm. Spleen: Normal in size without focal abnormality. Adrenals/Urinary Tract: Adrenal glands are within normal limits. Kidneys demonstrate a normal enhancement pattern bilaterally. No renal calculi are noted. Normal excretion  is noted on delayed images. The bladder is partially distended. Stomach/Bowel: Scattered diverticular change of the colon is noted. The appendix is within normal limits. No obstructive changes are seen. Small bowel and stomach are within normal limits. Vascular/Lymphatic: Aortic atherosclerosis. No enlarged abdominal or pelvic lymph nodes. Reproductive: Large calcified uterine fibroid is noted. No definitive adnexal mass is seen. Other: No abdominal wall hernia or abnormality. No abdominopelvic ascites. Musculoskeletal: Degenerative changes of lumbar spine are noted. No compression deformity is seen. IMPRESSION: Diffuse intrahepatic and extrahepatic biliary ductal dilatation with abrupt tapering in the region of the head of the pancreas. Adjacent to this there is a 15 mm hypodense lesion within the head of the pancreas suspicious for pancreatic neoplasm. ERCP with tissue sampling as well as EUS may be helpful for further diagnosis. 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 Uterine fibroid Electronically Signed   By: Inez Catalina M.D.   On: 07/07/2021 02:19   ? ? ? ?PROCEDURES: ? ?Critical Care performed: No ? ?Procedures ? ? ? ?IMPRESSION / MDM / ASSESSMENT AND PLAN / ED COURSE  ?I reviewed the triage vital signs and the nursing notes. ? ?81 y.o. female with a history of anemia, chronic  kidney disease, hyperlipidemia who was sent to the ED by her primary care doctor for abnormal labs.  Patient with 10 days of tea colored urine and chalky colored stool, sent here by her PCP for abnormal LFT

## 2021-07-07 NOTE — Transfer of Care (Signed)
Immediate Anesthesia Transfer of Care Note ? ?Patient: Meredith Wilcox ? ?Procedure(s) Performed: ENDOSCOPIC RETROGRADE CHOLANGIOPANCREATOGRAPHY (ERCP) ? ?Patient Location: PACU ? ?Anesthesia Type:General ? ?Level of Consciousness: sedated ? ?Airway & Oxygen Therapy: Patient Spontanous Breathing and Patient connected to nasal cannula oxygen ? ?Post-op Assessment: Report given to RN and Post -op Vital signs reviewed and stable ? ?Post vital signs: Reviewed and stable ? ?Last Vitals:  ?Vitals Value Taken Time  ?BP 155/62 07/07/21 1632  ?Temp    ?Pulse 77 07/07/21 1632  ?Resp 15 07/07/21 1632  ?SpO2 100 % 07/07/21 1632  ? ? ?Last Pain:  ?Vitals:  ? 07/07/21 1632  ?TempSrc:   ?PainSc: Asleep  ?   ? ?Patients Stated Pain Goal: 0 (07/07/21 1557) ? ?Complications: No notable events documented. ?

## 2021-07-07 NOTE — Anesthesia Procedure Notes (Signed)
Date/Time: 07/07/2021 4:23 PM ?Performed by: Nelda Marseille, CRNA ?Pre-anesthesia Checklist: Patient identified, Emergency Drugs available, Suction available, Patient being monitored and Timeout performed ?Oxygen Delivery Method: Nasal cannula ? ? ? ? ?

## 2021-07-07 NOTE — Assessment & Plan Note (Signed)
Findings concerning for pancreatic neoplasm ?Findings discussed with patient ?

## 2021-07-07 NOTE — ED Notes (Signed)
Dr. Alfred Levins in with pt and family at this time ? ?

## 2021-07-07 NOTE — Progress Notes (Signed)
No charge progress note. ? ?Meredith Wilcox is a 81 y.o. female with medical history significant for Arthritis, pelvic floor dysfunction, GERD with prior positive H. pylori who was sent by her PCP with abnormal liver function test.  Patient initially presented because she noticed that for the last 10 days her urine was tea colored and her stool was like a light-colored chalk.  She denies abdominal pain, nausea or vomiting.  She does have some indigestion that is not beyond her baseline.  Denies unintentional weight loss.  Appetite is essentially unchanged. ?ED course: BP 206/76, temp 99 with otherwise normal vitals blood work significant for hemoglobin of 11.5 but mostly for abnormal liver function with AST/ALT of 194/241, alk phos 829, total bili 12.6, direct 7.7 and indirect 4.8.  Urine was amber-colored.  CT abdomen and pelvis with contrast showed the following: ?IMPRESSION: ?Diffuse intrahepatic and extrahepatic biliary ductal dilatation with ?abrupt tapering in the region of the head of the pancreas. Adjacent ?to this there is a 15 mm hypodense lesion within the head of the ?pancreas suspicious for pancreatic neoplasm. ERCP with tissue ?sampling as well as EUS may be helpful for further diagnosis. ?  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 ?  ?Uterine fibroid ?  ?GI was consulted for ERCP.  Patient will need EUS, which can be done as an outpatient. ? ?GI started her on Zosyn for concern of biliary dilatation.  Patient with no symptoms and no leukocytosis. ?Worsening liver enzymes and T. Bili. ? ?Per patient she thinks that she is at her baseline and was feeling hungry when seen today.  She was waiting for her procedure and wanted to go home ASAP. ? ?ERCP is planned for later today. ?We will keep her n.p.o. and then start diet after the procedure. ? ?If remains stable, she can be discharged home and follow-up with gastroenterology as an outpatient. ?Labs sent for CA 19-9 and CEA. ?High suspicion for  pancreatic malignancy. ? ?

## 2021-07-08 ENCOUNTER — Encounter: Payer: Self-pay | Admitting: Gastroenterology

## 2021-07-08 DIAGNOSIS — K831 Obstruction of bile duct: Secondary | ICD-10-CM | POA: Diagnosis not present

## 2021-07-08 LAB — COMPREHENSIVE METABOLIC PANEL
ALT: 217 U/L — ABNORMAL HIGH (ref 0–44)
AST: 188 U/L — ABNORMAL HIGH (ref 15–41)
Albumin: 3.1 g/dL — ABNORMAL LOW (ref 3.5–5.0)
Alkaline Phosphatase: 711 U/L — ABNORMAL HIGH (ref 38–126)
Anion gap: 9 (ref 5–15)
BUN: 11 mg/dL (ref 8–23)
CO2: 27 mmol/L (ref 22–32)
Calcium: 9.4 mg/dL (ref 8.9–10.3)
Chloride: 102 mmol/L (ref 98–111)
Creatinine, Ser: 0.69 mg/dL (ref 0.44–1.00)
GFR, Estimated: >60 mL/min (ref >60)
Glucose, Bld: 90 mg/dL (ref 70–99)
Potassium: 3.6 mmol/L (ref 3.5–5.1)
Sodium: 138 mmol/L (ref 135–145)
Total Bilirubin: 8.2 mg/dL — ABNORMAL HIGH (ref 0.3–1.2)
Total Protein: 7.5 g/dL (ref 6.5–8.1)

## 2021-07-08 LAB — CANCER ANTIGEN 19-9: CA 19-9: 94 U/mL — ABNORMAL HIGH (ref 0–35)

## 2021-07-08 LAB — CEA: CEA: 3.2 ng/mL (ref 0.0–4.7)

## 2021-07-08 MED ORDER — ONDANSETRON HCL 4 MG PO TABS: 4.0000 mg | ORAL_TABLET | Freq: Four times a day (QID) | ORAL | 0 refills | Status: DC | PRN

## 2021-07-08 NOTE — Progress Notes (Addendum)
? ?GI Inpatient Follow-up Note ? ?Patient Identification: ?Meredith Wilcox is a 81 y.o. female admitted for painless jaundice and 2 weeks of darkening urine and pale stool with a few pound weight loss. CT showed 28m hypodense lesion pancreatic head with abrupt CBD tapering and diffuse intrahepatic dilation as well. Started on Zoysn and had ERCP yesterday. ? ?Subjective: No abd pain overnight. Ate cup of broth about 6am without abd pain, nausea, vomiting. No issues w/ oral intake. Denies n/v. No BM overnight. Ready to go home.  ? ? ? ? ?Continuous Inpatient Infusions: ?  ? piperacillin-tazobactam (ZOSYN)  IV 3.375 g (07/08/21 00626  ? ? ?PRN Inpatient Medications:  ?acetaminophen **OR** acetaminophen, hydrALAZINE, morphine injection, ondansetron **OR** ondansetron (ZOFRAN) IV ? ?Review of Systems: ?Constitutional: Weight is stable.  ?Eyes: No changes in vision. ?ENT: No oral lesions, sore throat.  ?GI: see HPI.  ?Heme/Lymph: No easy bruising.  ?CV: No chest pain.  ?GU: No hematuria.  ?Integumentary: No rashes.  ?Neuro: No headaches.  ?Psych: No depression/anxiety.  ?Endocrine: No heat/cold intolerance.  ?Allergic/Immunologic: No urticaria.  ?Resp: No cough, SOB.  ?Musculoskeletal: No joint swelling.  ?  ?Physical Examination: ?BP 140/61 (BP Location: Right Arm)   Pulse 70   Temp 98 ?F (36.7 ?C)   Resp 16   Ht '5\' 6"'$  (1.676 m)   Wt 66.7 kg   SpO2 100%   BMI 23.73 kg/m?  ?Gen: NAD, alert and oriented x 4. Resting in bed ?HEENT: PEERLA, EOMI, ?Neck: supple, no JVD or thyromegaly ?Chest: CTA bilaterally, no wheezes, crackles, or other adventitious sounds ?CV: RRR, no m/g/c/r ?Abd: soft, NT, ND, +BS in all four quadrants; no HSM, guarding, ridigity, or rebound tenderness ?Ext: no edema, well perfused with 2+ pulses, ?Skin: no rash or lesions noted ?Lymph: no LAD ? ?Data: ?Lab Results  ?Component Value Date  ? WBC 4.0 07/06/2021  ? HGB 11.5 (L) 07/06/2021  ? HCT 34.4 (L) 07/06/2021  ? MCV 84.7 07/06/2021  ? PLT 262  07/06/2021  ? ?Recent Labs  ?Lab 07/06/21 ?1007 07/06/21 ?2059  ?HGB 11.3* 11.5*  ? ?Lab Results  ?Component Value Date  ? NA 138 07/08/2021  ? K 3.6 07/08/2021  ? CL 102 07/08/2021  ? CO2 27 07/08/2021  ? BUN 11 07/08/2021  ? CREATININE 0.69 07/08/2021  ? ?Lab Results  ?Component Value Date  ? ALT 217 (H) 07/08/2021  ? AST 188 (H) 07/08/2021  ? ALKPHOS 711 (H) 07/08/2021  ? BILITOT 8.2 (H) 07/08/2021  ? ?Recent Labs  ?Lab 07/07/21 ?0333  ?INR 1.0  ? ?ERCP yesterday: ? ? ?Assessment/Plan: ?Meredith Wilcox a 81y.o. female admitted for painless jaundice.  ? ?Obstructive jaundice - CT imaing on admission w/ 15 mm lesion in the pancreatic head with abrupt tapering of the CBD suspicious for pancreatic malignancy leading to biliary obstruction. Elevated CA 19-9 at 94. Had ERCP yesterday w/ sphincterotomy and T bili trending down this morning. Will ultimately need EUS as outpatient for biopsy of pancreatic head lesion and follow up oncology care after discharge ?2. Pancreatic head lesion ? ?Recommendations: ?Slowly advance diet as tolerated ?Will arrange outpatient oncology f/up and outpatient EUS  ?Will need follow up ERCP 3 months for stent exchange ?Okay to discontinue ABX ?Supportive care per primary team ? ?GI to sign off. Please call with questions or concerns. Case discussed w/ Dr. RVirgina Jock Business card provided for office contact and follow-up. ? ? ? ?JRonney Asters PA-C ?[facility]  ? ? ? ?

## 2021-07-08 NOTE — Consult Note (Signed)
?[facility]  ?[phone] [phone] ? ?IDKathryne Wilcox OB: 05-29-1940  MR#: [id number]  IRS#:[id number] ? ?Patient Care Team: ?[name], MD as PCP - General (Family Medicine) ?Birder Robson, MD as Consulting Physician (Ophthalmology) ? ?CHIEF COMPLAINT: Pancreatic mass ? ?INTERVAL HISTORY: Patient is a 81 year old female who presented with painless jaundice.  Subsequent imaging and ERCP revealed a pancreatic mass highly suspicious for underlying malignancy.  Currently, she feels well and back to her baseline.  She has no neurologic complaints.  She denies any recent fevers or illnesses.  She has a fair appetite, but denies weight loss.  She has no chest pain, shortness of breath, cough, or hemoptysis.  She has no nausea, vomiting, constipation, or diarrhea.  She has no abdominal pain.  She has no urinary complaints.  She denies any further icterus or jaundice.  Patient offers no specific complaints today. ? ?REVIEW OF SYSTEMS:   ?Review of Systems  ?Constitutional: Negative.  Negative for fever, malaise/fatigue and weight loss.  ?Respiratory: Negative.  Negative for cough, hemoptysis and shortness of breath.   ?Cardiovascular: Negative.  Negative for chest pain and leg swelling.  ?Gastrointestinal: Negative.  Negative for abdominal pain, nausea and vomiting.  ?Genitourinary: Negative.  Negative for dysuria.  ?Musculoskeletal: Negative.  Negative for back pain.  ?Skin: Negative.  Negative for rash.  ?Neurological: Negative.  Negative for dizziness, focal weakness, weakness and headaches.  ?Psychiatric/Behavioral: Negative.  The patient is not nervous/anxious.   ? ?As per HPI. Otherwise, a complete review of systems is negative. ? ?PAST MEDICAL HISTORY: ?Past Medical History:  ?Diagnosis Date  ? Anemia   ? H/O DURING PREGNANCY  ? Arthritis   ? Chronic kidney disease   ? KIDNEY PROBLEMS AROUND AGE 20  ? Geographic tongue   ? GERD (gastroesophageal reflux disease)   ?  H/O NO MEDS  ? Helicobacter pylori gastrointestinal tract infection   ? Hyperlipidemia   ? Incontinence   ? Indigestion   ? Prolapse of vaginal walls   ? Right sided sciatica   ? Tachycardia   ? Urethral prolapse   ? ? ?PAST SURGICAL HISTORY: ?Past Surgical History:  ?Procedure Laterality Date  ? BREAST BIOPSY Left 10/11/2016  ? left breast stereo/ VASCULAR LESION  ? BREAST BIOPSY Left 12/02/2016  ? Procedure: NEEDLE LOCALIZATION;  Surgeon: [name], MD;  Location: [facility] ORS;  Service: General;  Laterality: Left;  ? BREAST EXCISIONAL BIOPSY Left 12/02/2016  ? left lumpectomy  ? BREAST LUMPECTOMY Left 12/02/2016  ? Procedure: EXCISION BREAST MASS;  Surgeon: [name], MD;  Location: [facility] ORS;  Service: General;  Laterality: Left;  ? DILATION AND CURETTAGE OF UTERUS    ? dnc    ? 37  ? ERCP N/A 07/07/2021  ? Procedure: ENDOSCOPIC RETROGRADE CHOLANGIOPANCREATOGRAPHY (ERCP);  Surgeon: [name], MD;  Location: [facility] ENDOSCOPY;  Service: Endoscopy;  Laterality: N/A;  ? fatty tumor removal Right   ? hand  ? ? ?FAMILY HISTORY: ?Family History  ?Problem Relation Age of Onset  ? Diabetes Mother   ? Hypertension Mother   ? Cardiomyopathy Sister   ? Down syndrome Sister   ? Leukemia Brother   ? Alcohol abuse Son   ?     youngest son  ? Asthma Son   ?     youngest son  ? Rheum arthritis Sister   ? Drug abuse Child 23  ? Breast cancer Neg Hx   ? Kidney cancer Neg Hx   ?  Bladder Cancer Neg Hx   ? Prostate cancer Neg Hx   ? ? ?ADVANCED DIRECTIVES (Y/N):  '@ADVDIR'$ @ ? ?HEALTH MAINTENANCE: ?Social History  ? ?Tobacco Use  ? Smoking status: Never  ? Smokeless tobacco: Never  ? Tobacco comments:  ?  smoking cessation materials not required  ?Vaping Use  ? Vaping Use: Never used  ?Substance Use Topics  ? Alcohol use: No  ?  Alcohol/week: 0.0 standard drinks  ? Drug use: No  ? ? ? Colonoscopy: ? PAP: ? Bone density: ? Lipid panel: ? ?Allergies  ?Allergen Reactions  ? Tussionex Pennkinetic Er [Hydrocod  Poli-Chlorphe Poli Er] Other (See Comments)  ?  Real dizzy headed even after a while still walking every direction but straight, patient stated she stopped taking it because of that.  ? Hydrocodone   ?  Light headed and dizziness  ? ? ?Current Facility-Administered Medications  ?Medication Dose Route Frequency Provider Last Rate Last Admin  ? acetaminophen (TYLENOL) tablet 650 mg  650 mg Oral Q6H PRN Athena Masse, MD      ? Or  ? acetaminophen (TYLENOL) suppository 650 mg  650 mg Rectal Q6H PRN Athena Masse, MD      ? hydrALAZINE (APRESOLINE) injection 10 mg  10 mg Intravenous Q4H PRN Athena Masse, MD   10 mg at 07/07/21 1514  ? morphine (PF) 2 MG/ML injection 2 mg  2 mg Intravenous Q2H PRN Athena Masse, MD      ? ondansetron [facility]) tablet 4 mg  4 mg Oral Q6H PRN Athena Masse, MD      ? Or  ? ondansetron [facility]) injection 4 mg  4 mg Intravenous Q6H PRN Athena Masse, MD      ? piperacillin-tazobactam (ZOSYN) IVPB 3.375 g  3.375 g Intravenous Q8H Annamaria Helling, DO 12.5 mL/hr at 07/08/21 0624 3.375 g at 07/08/21 6144  ? ? ?OBJECTIVE: ?Vitals:  ? 07/08/21 0418 07/08/21 0743  ?BP: (!) 146/63 140/61  ?Pulse: 75 70  ?Resp: 18 16  ?Temp: 98.3 ?F (36.8 ?C) 98 ?F (36.7 ?C)  ?SpO2: 100% 100%  ?   Body mass index is 23.73 kg/m?Marland Kitchen    ECOG FS:0 - Asymptomatic ? ?General: Well-developed, well-nourished, no acute distress. ?Eyes: Pink conjunctiva, anicteric sclera. ?HEENT: Normocephalic, moist mucous membranes. ?Lungs: No audible wheezing or coughing. ?Heart: Regular rate and rhythm. ?Abdomen: Soft, nontender, no obvious distention. ?Musculoskeletal: No edema, cyanosis, or clubbing. ?Neuro: Alert, answering all questions appropriately. Cranial nerves grossly intact. ?Skin: No rashes or petechiae noted. ?Psych: Normal affect. ?Lymphatics: No cervical, calvicular, axillary or inguinal LAD. ? ? ?LAB RESULTS: ? ?Lab Results  ?Component Value Date  ? NA 138 07/08/2021  ? K 3.6 07/08/2021  ? CL 102 07/08/2021   ? CO2 27 07/08/2021  ? GLUCOSE 90 07/08/2021  ? BUN 11 07/08/2021  ? CREATININE 0.69 07/08/2021  ? CALCIUM 9.4 07/08/2021  ? PROT 7.5 07/08/2021  ? ALBUMIN 3.1 (L) 07/08/2021  ? AST 188 (H) 07/08/2021  ? ALT 217 (H) 07/08/2021  ? ALKPHOS 711 (H) 07/08/2021  ? BILITOT 8.2 (H) 07/08/2021  ? GFRNONAA >60 07/08/2021  ? GFRAA 84 08/13/2020  ? ? ?Lab Results  ?Component Value Date  ? WBC 4.0 07/06/2021  ? NEUTROABS 2.2 07/06/2021  ? HGB 11.5 (L) 07/06/2021  ? HCT 34.4 (L) 07/06/2021  ? MCV 84.7 07/06/2021  ? PLT 262 07/06/2021  ? ? ? ?STUDIES: ?CT ABDOMEN PELVIS W CONTRAST ? ?Result Date:  07/07/2021 ?CLINICAL DATA:  Abdominal pain and elevated bilirubin, initial encounter EXAM: CT ABDOMEN AND PELVIS WITH CONTRAST TECHNIQUE: Multidetector CT imaging of the abdomen and pelvis was performed using the standard protocol following bolus administration of intravenous contrast. RADIATION DOSE REDUCTION: This exam was performed according to the departmental dose-optimization program which includes automated exposure control, adjustment of the mA and/or kV according to patient size and/or use of iterative reconstruction technique. CONTRAST:  187m OMNIPAQUE IOHEXOL 300 MG/ML  SOLN COMPARISON:  None. FINDINGS: Lower chest: No acute abnormality. Hepatobiliary: Gallbladder is well distended without definitive cholelithiasis. Diffuse intrahepatic biliary ductal dilatation is seen. No hepatic mass is seen. Pancreas: Body and tail of the pancreas are within normal limits. The pancreatic head is mildly prominent and there is a focal 15 mm hypodense partially enhancing lesion in the head of the pancreas best seen on image number 40 of series 2. This lies adjacent to the abrupt tapering of the common bile duct in the pancreatic head and is suspicious for pancreatic neoplasm. Spleen: Normal in size without focal abnormality. Adrenals/Urinary Tract: Adrenal glands are within normal limits. Kidneys demonstrate a normal enhancement pattern  bilaterally. No renal calculi are noted. Normal excretion is noted on delayed images. The bladder is partially distended. Stomach/Bowel: Scattered diverticular change of the colon is noted. The appendix is within no

## 2021-07-08 NOTE — Progress Notes (Signed)
Pt d/c to home via husband. IV's removed intact. VSS. Education completed. All questions answered. All belongings sent with pt. ?

## 2021-07-08 NOTE — Discharge Summary (Signed)
?Physician Discharge Summary ?  ?Patient: Meredith Wilcox MRN: [id number] DOB: 1940-10-11  ?Admit date:     07/07/2021  ?Discharge date: 07/08/21  ?Discharge Physician: Lorella Nimrod  ? ?PCP: [name], MD  ? ?Recommendations at discharge:  ?Please obtain CMP and CBC in a week ?Follow-up with gastroenterology and they will arrange EUS. ?Follow-up with oncology. ?She will also need a repeat ERCP in 54-monthfor stent exchange. ? ?Discharge Diagnoses: ?Principal Problem: ?  Obstructive jaundice ?Active Problems: ?  Abnormal CT of the pancreas ?  GERD (gastroesophageal reflux disease) ?  Elevated blood pressure reading with diagnosis of hypertension ?  Pancreatic mass ? ?Hospital Course: ?Meredith Cavendishis a 81y.o. female with medical history significant for Arthritis, pelvic floor dysfunction, GERD with prior positive H. pylori who was sent by her PCP with abnormal liver function test.  Patient initially presented because she noticed that for the last 10 days her urine was tea colored and her stool was like a light-colored chalk.  She denies abdominal pain, nausea or vomiting.  She does have some indigestion that is not beyond her baseline.  Denies unintentional weight loss.  Appetite is essentially unchanged. ?ED course: BP 206/76, temp 99 with otherwise normal vitals blood work significant for hemoglobin of 11.5 but mostly for abnormal liver function with AST/ALT of 194/241, alk phos 829, total bili 12.6, direct 7.7 and indirect 4.8.  Urine was amber-colored.  CT abdomen and pelvis with contrast showed the following: ?IMPRESSION: ?Diffuse intrahepatic and extrahepatic biliary ductal dilatation with ?abrupt tapering in the region of the head of the pancreas. Adjacent ?to this there is a 15 mm hypodense lesion within the head of the ?pancreas suspicious for pancreatic neoplasm. ERCP with tissue ?sampling as well as EUS may be helpful for further diagnosis. ?  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 ?   ?Uterine fibroid ?  ?GI was consulted for ERCP, patient had her procedure on 07/07/2021 with sphincterotomy and a stent placement with GI.  Resulted in improvement in liver functions and T. Bili. ?Patient will need a repeat ERCP in 365-monthor stent exchange. ?She also need EUS which will be arranged by her GI as an outpatient for pancreatic biopsy and definitive diagnosis as she has a suspicious lesion and very high risk for malignancy. ?CEA came back normal but CA 19-9 was elevated at 94. ? ?Dr. FiGrayland Ormondrom oncology was also consulted and they will arrange a close outpatient follow-up for further recommendations and management. ? ?Patient remained pain-free, no nausea or vomiting.  Able to tolerate diet and is being discharged home. ? ?She will continue her home medications and follow-up with her providers. ? ?Assessment and Plan: ?* Obstructive jaundice ?GI consult for ERCP ? ?Elevated blood pressure reading with diagnosis of hypertension ?Likely undiagnosed hypertension ?As needed IV hydralazine while n.p.o. ? ?GERD (gastroesophageal reflux disease) ?IV Protonix as needed while n.p.o. ? ?Abnormal CT of the pancreas ?Findings concerning for pancreatic neoplasm ?Findings discussed with patient ? ? ?Consultants: Gastroenterology, oncology ?Procedures performed: ERCP ?Disposition: Home ?Diet recommendation:  ?Discharge Diet Orders (From admission, onward)  ? ?  Start     Ordered  ? 07/08/21 0000  Diet - low sodium heart healthy       ? 07/08/21 1001  ? ?  ?  ? ?  ? ?Regular diet ?DISCHARGE MEDICATION: ?Allergies as of 07/08/2021   ? ?   Reactions  ? Tussionex Pennkinetic Er [hydrocod Poli-chlorphe Poli Er] Other (See Comments)  ?  Real dizzy headed even after a while still walking every direction but straight, patient stated she stopped taking it because of that.  ? Hydrocodone   ? Light headed and dizziness  ? ?  ? ?  ?Medication List  ?  ? ?STOP taking these medications   ? ?azithromycin 250 MG tablet ?Commonly  known as: ZITHROMAX ?  ?predniSONE 10 MG tablet ?Commonly known as: DELTASONE ?  ?Premarin vaginal cream ?Generic drug: conjugated estrogens ?  ? ?  ? ?TAKE these medications   ? ?[name] PO ?Take 1 tablet by mouth daily. ?  ?OMEGA-3 PO ?Take 1 capsule by mouth 2 (two) times daily. Once to twice daily (pt sometimes forgets 2nd dose) ?  ?ondansetron 4 MG tablet ?Commonly known as: ZOFRAN ?Take 1 tablet (4 mg total) by mouth every 6 (six) hours as needed for nausea. ?  ?TURMERIC PO ?Take 1 capsule by mouth daily. ?  ?vitamin C 1000 MG tablet ?Take 1,000 mg by mouth daily. ?  ?Vitamin D 50 MCG (2000 UT) tablet ?Take 2,000 Units by mouth daily. Reported on 06/04/2015 ?  ? ?  ? ? Follow-up Information   ? ? [name], MD. Schedule an appointment as soon as possible for a visit in 1 week(s).   ?Specialty: Family Medicine ?Contact information: ?[address] ?[address] ?[place] [address] ?[phone] ? ? ?  ?  ? ? Annamaria Helling, DO. Schedule an appointment as soon as possible for a visit in 1 week(s).   ?Specialty: Gastroenterology ?Contact information: ?[facility] [address] ?[phone] ? ? ?  ?  ? ? Lloyd Huger, MD. Schedule an appointment as soon as possible for a visit in 1 week(s).   ?Specialty: Oncology ?Contact information: ?[address] ?[place] [address] ?[phone] ? ? ?  ?  ? ?  ?  ? ?  ? ?Discharge Exam: ?Filed Weights  ? 07/07/21 1534 07/07/21 1557  ?Weight: 66.7 kg 66.7 kg  ? ?General.     In no acute distress. ?Pulmonary.  Lungs clear bilaterally, normal respiratory effort. ?CV.  Regular rate and rhythm, no JVD, rub or murmur. ?Abdomen.  Soft, nontender, nondistended, BS positive. ?CNS.  Alert and oriented x3.  No focal neurologic deficit. ?Extremities.  No edema, no cyanosis, pulses intact and symmetrical. ?Psychiatry.  Judgment and insight appears normal.  ? ?Condition at discharge: stable ? ?The results of significant  diagnostics from this hospitalization (including imaging, microbiology, ancillary and laboratory) are listed below for reference.  ? ?Imaging Studies: ?CT ABDOMEN PELVIS W CONTRAST ? ?Result Date: 07/07/2021 ?CLINICAL DATA:  Abdominal pain and elevated bilirubin, initial encounter EXAM: CT ABDOMEN AND PELVIS WITH CONTRAST TECHNIQUE: Multidetector CT imaging of the abdomen and pelvis was performed using the standard protocol following bolus administration of intravenous contrast. RADIATION DOSE REDUCTION: This exam was performed according to the departmental dose-optimization program which includes automated exposure control, adjustment of the mA and/or kV according to patient size and/or use of iterative reconstruction technique. CONTRAST:  162m OMNIPAQUE IOHEXOL 300 MG/ML  SOLN COMPARISON:  None. FINDINGS: Lower chest: No acute abnormality. Hepatobiliary: Gallbladder is well distended without definitive cholelithiasis. Diffuse intrahepatic biliary ductal dilatation is seen. No hepatic mass is seen. Pancreas: Body and tail of the pancreas are within normal limits. The pancreatic head is mildly prominent and there is a focal 15 mm hypodense partially enhancing lesion in the head of the pancreas best seen on image number  40 of series 2. This lies adjacent to the abrupt tapering of the common bile duct in the pancreatic head and is suspicious for pancreatic neoplasm. Spleen: Normal in size without focal abnormality. Adrenals/Urinary Tract: Adrenal glands are within normal limits. Kidneys demonstrate a normal enhancement pattern bilaterally. No renal calculi are noted. Normal excretion is noted on delayed images. The bladder is partially distended. Stomach/Bowel: Scattered diverticular change of the colon is noted. The appendix is within normal limits. No obstructive changes are seen. Small bowel and stomach are within normal limits. Vascular/Lymphatic: Aortic atherosclerosis. No enlarged abdominal or pelvic lymph  nodes. Reproductive: Large calcified uterine fibroid is noted. No definitive adnexal mass is seen. Other: No abdominal wall hernia or abnormality. No abdominopelvic ascites. Musculoskeletal: Degenerative changes

## 2021-07-08 NOTE — Clinical Social Work Note (Signed)
?  Transition of Care (TOC) Screening Note ? ? ?Patient Details  ?Name: Meredith Wilcox ?Date of Birth: 08-17-1940 ? ? ?Transition of Care (TOC) CM/SW Contact:    ?Galilea Quito A Marquis Down, LCSW ?Phone Number:[phone] ?07/08/2021, 2:48 PM ? ? ? ?Transition of Care Department [facility]) has reviewed patient and no TOC needs have been identified at this time. We will continue to monitor patient advancement through interdisciplinary progression rounds. If new patient transition needs arise, please place a TOC consult. ? ? ?

## 2021-07-10 ENCOUNTER — Ambulatory Visit: Payer: Self-pay

## 2021-07-10 NOTE — Telephone Encounter (Signed)
?  Chief Complaint: indigestion ?Symptoms: feels like gas pain that comes and goes under chest ?Frequency: 1 day ?Pertinent Negatives: chest pain that sharp  ?Disposition: '[]'$ ED /'[]'$ Urgent Care (no appt availability in office) / '[]'$ Appointment(In office/virtual)/ '[]'$  [place] [facility]/ '[x]'$ Home Care/ '[]'$ Refused Recommended Disposition /'[]'$ [facility]/ '[]'$  Follow-up with PCP ?Additional Notes: pt states she has a dull sensation under her breast that comes and goes and feels like she needs to burp and the feeling would be gone. Pt denies having issues with bowels. Advised pt to get some Mylanta or Gas-x and see if that will relieve symptoms. Advised if doesn't improve by Monday to call back to schedule an appt.  ? ? ?Reason for Disposition ? [1] Patient says chest pain feels exactly the same as previously diagnosed "heartburn" AND [2] describes burning in chest AND [3] accompanying sour taste in mouth ? ?Answer Assessment - Initial Assessment Questions ?1. LOCATION: "Where does it hurt?"   ?    Under the breast  ?2. RADIATION: "Does the pain go anywhere else?" (e.g., into neck, jaw, arms, back) ?    Into chest  ?3. ONSET: "When did the chest pain begin?" (Minutes, hours or days)  ?    Last night ?4. PATTERN "Does the pain come and go, or has it been constant since it started?"  "Does it get worse with exertion?"  ?    Comes and goes ?6. SEVERITY: "How bad is the pain?"  (e.g., Scale 1-10; mild, moderate, or severe) ?   - MILD (1-3): doesn't interfere with normal activities  ?   - MODERATE (4-7): interferes with normal activities or awakens from sleep ?   - SEVERE (8-10): excruciating pain, unable to do any normal activities   ?    dullness ?9. CAUSE: "What do you think is causing the chest pain?" ?    Feels like gas  ?10. OTHER SYMPTOMS: "Do you have any other symptoms?" (e.g., dizziness, nausea, vomiting, sweating, fever, difficulty breathing, cough) ?      No ? ?Protocols used: Chest Pain-A-AH ? ?

## 2021-07-13 ENCOUNTER — Ambulatory Visit: Payer: Medicare Other | Admitting: Family Medicine

## 2021-07-13 ENCOUNTER — Telehealth: Payer: Self-pay

## 2021-07-13 NOTE — Telephone Encounter (Signed)
Received referral for EUS. No availability at [facility] until 07/30/2021. EUS referral sent to [facility] for scheduling. They will contact Ms. Brusca with the date/time/instructions. Encouraged to call with any needs. ?

## 2021-07-15 DIAGNOSIS — R935 Abnormal findings on diagnostic imaging of other abdominal regions, including retroperitoneum: Secondary | ICD-10-CM | POA: Diagnosis not present

## 2021-07-15 DIAGNOSIS — R7989 Other specified abnormal findings of blood chemistry: Secondary | ICD-10-CM | POA: Diagnosis not present

## 2021-07-16 ENCOUNTER — Inpatient Hospital Stay: Payer: Medicare Other | Admitting: Family Medicine

## 2021-07-20 NOTE — Progress Notes (Signed)
?[place]  ?[phone]) [phone] [phone] ? ?IDKathryne Wilcox OB: November 21, 1940  MR#: [id number]  QHU#:[id number] ? ?Patient Care Team: ?[name], MD as PCP - General (Family Medicine) ?Birder Robson, MD as Consulting Physician (Ophthalmology) ? ?CHIEF COMPLAINT: Pancreatic mass ? ?INTERVAL HISTORY: Patient returns to clinic today for hospital follow-up and further evaluation. She has symptoms of reflux, but otherwise feels well.  She denies any further abdominal pain. She has no neurologic complaints.  She denies any recent fevers or illnesses.  She has a fair appetite, but denies weight loss.  She has no chest pain, shortness of breath, cough, or hemoptysis.  She has no nausea, vomiting, constipation, or diarrhea. She has no urinary complaints.  Patient offers no further specific complaints today.   ? ?REVIEW OF SYSTEMS:   ?Review of Systems  ?Constitutional: Negative.  Negative for fever, malaise/fatigue and weight loss.  ?Respiratory: Negative.  Negative for cough, hemoptysis and shortness of breath.   ?Cardiovascular: Negative.  Negative for chest pain and leg swelling.  ?Gastrointestinal:  Positive for heartburn. Negative for abdominal pain.  ?Genitourinary: Negative.  Negative for dysuria.  ?Musculoskeletal: Negative.  Negative for back pain.  ?Skin: Negative.  Negative for rash.  ?Neurological: Negative.  Negative for dizziness, seizures, weakness and headaches.  ?Psychiatric/Behavioral: Negative.  The patient is not nervous/anxious.   ? ?As per HPI. Otherwise, a complete review of systems is negative. ? ?PAST MEDICAL HISTORY: ?Past Medical History:  ?Diagnosis Date  ? Anemia   ? H/O DURING PREGNANCY  ? Arthritis   ? Chronic kidney disease   ? KIDNEY PROBLEMS AROUND AGE 18  ? Geographic tongue   ? GERD (gastroesophageal reflux disease)   ? H/O NO MEDS  ? Helicobacter pylori gastrointestinal tract infection   ? Hyperlipidemia   ? Incontinence   ? Indigestion   ?  Prolapse of vaginal walls   ? Right sided sciatica   ? Tachycardia   ? Urethral prolapse   ? ? ?PAST SURGICAL HISTORY: ?Past Surgical History:  ?Procedure Laterality Date  ? BREAST BIOPSY Left 10/11/2016  ? left breast stereo/ VASCULAR LESION  ? BREAST BIOPSY Left 12/02/2016  ? Procedure: NEEDLE LOCALIZATION;  Surgeon: [name], MD;  Location: [facility] ORS;  Service: General;  Laterality: Left;  ? BREAST EXCISIONAL BIOPSY Left 12/02/2016  ? left lumpectomy  ? BREAST LUMPECTOMY Left 12/02/2016  ? Procedure: EXCISION BREAST MASS;  Surgeon: [name], MD;  Location: [facility] ORS;  Service: General;  Laterality: Left;  ? DILATION AND CURETTAGE OF UTERUS    ? dnc    ? 51  ? ERCP N/A 07/07/2021  ? Procedure: ENDOSCOPIC RETROGRADE CHOLANGIOPANCREATOGRAPHY (ERCP);  Surgeon: [name], MD;  Location: [facility] ENDOSCOPY;  Service: Endoscopy;  Laterality: N/A;  ? fatty tumor removal Right   ? hand  ? ? ?FAMILY HISTORY: ?Family History  ?Problem Relation Age of Onset  ? Diabetes Mother   ? Hypertension Mother   ? Cardiomyopathy Sister   ? Down syndrome Sister   ? Leukemia Brother   ? Alcohol abuse Son   ?     youngest son  ? Asthma Son   ?     youngest son  ? Rheum arthritis Sister   ? Drug abuse Child 23  ? Breast cancer Neg Hx   ? Kidney cancer Neg Hx   ? Bladder Cancer Neg Hx   ? Prostate cancer Neg Hx   ? ? ?ADVANCED DIRECTIVES (Y/N):  N ? ?  HEALTH MAINTENANCE: ?Social History  ? ?Tobacco Use  ? Smoking status: Never  ? Smokeless tobacco: Never  ? Tobacco comments:  ?  smoking cessation materials not required  ?Vaping Use  ? Vaping Use: Never used  ?Substance Use Topics  ? Alcohol use: No  ?  Alcohol/week: 0.0 standard drinks  ? Drug use: No  ? ? ? Colonoscopy: ? PAP: ? Bone density: ? Lipid panel: ? ?Allergies  ?Allergen Reactions  ? Tussionex Pennkinetic Er [Hydrocod Poli-Chlorphe Poli Er] Other (See Comments)  ?  Real dizzy headed even after a while still walking every direction but straight, patient  stated she stopped taking it because of that.  ? Hydrocodone   ?  Light headed and dizziness  ? ? ?Current Outpatient Medications  ?Medication Sig Dispense Refill  ? Ascorbic Acid (VITAMIN C) 1000 MG tablet Take 1,000 mg by mouth daily. (Patient not taking: Reported on 07/22/2021)    ? Cholecalciferol (VITAMIN D) 2000 UNITS tablet Take 2,000 Units by mouth daily. Reported on 06/04/2015 (Patient not taking: Reported on 07/22/2021)    ? Multiple Vitamins-Minerals ([name] PO) Take 1 tablet by mouth daily.  (Patient not taking: Reported on 07/22/2021)    ? Omega-3 Fatty Acids (OMEGA-3 PO) Take 1 capsule by mouth 2 (two) times daily. Once to twice daily (pt sometimes forgets 2nd dose) (Patient not taking: Reported on 07/22/2021)    ? ondansetron (ZOFRAN) 4 MG tablet Take 1 tablet (4 mg total) by mouth every 6 (six) hours as needed for nausea. (Patient not taking: Reported on 07/22/2021) 20 tablet 0  ? TURMERIC PO Take 1 capsule by mouth daily.  (Patient not taking: Reported on 07/22/2021)    ? ?No current facility-administered medications for this visit.  ? ? ?OBJECTIVE: ?Vitals:  ? 07/22/21 1124  ?BP: (!) 181/74  ?Pulse: 81  ?Resp: 16  ?Temp: 98.2 ?F (36.8 ?C)  ?SpO2: 100%  ?   Body mass index is 22.69 kg/m?Marland Kitchen    ECOG FS:0 - Asymptomatic ? ?General: Well-developed, well-nourished, no acute distress. ?Eyes: Pink conjunctiva, anicteric sclera. ?HEENT: Normocephalic, moist mucous membranes. ?Lungs: No audible wheezing or coughing. ?Heart: Regular rate and rhythm. ?Abdomen: Soft, nontender, no obvious distention. ?Musculoskeletal: No edema, cyanosis, or clubbing. ?Neuro: Alert, answering all questions appropriately. Cranial nerves grossly intact. ?Skin: No rashes or petechiae noted. ?Psych: Normal affect. ?Lymphatics: No cervical, calvicular, axillary or inguinal LAD. ? ? ?LAB RESULTS: ? ?Lab Results  ?Component Value Date  ? NA 136 07/22/2021  ? K 4.2 07/22/2021  ? CL 99 07/22/2021  ? CO2 30 07/22/2021  ? GLUCOSE 122 (H)  07/22/2021  ? BUN 12 07/22/2021  ? CREATININE 0.74 07/22/2021  ? CALCIUM 9.6 07/22/2021  ? PROT 8.7 (H) 07/22/2021  ? ALBUMIN 3.7 07/22/2021  ? AST 93 (H) 07/22/2021  ? ALT 102 (H) 07/22/2021  ? ALKPHOS 437 (H) 07/22/2021  ? BILITOT 2.6 (H) 07/22/2021  ? GFRNONAA >60 07/22/2021  ? GFRAA 84 08/13/2020  ? ? ?Lab Results  ?Component Value Date  ? WBC 3.4 (L) 07/22/2021  ? NEUTROABS 1.9 07/22/2021  ? HGB 12.3 07/22/2021  ? HCT 37.6 07/22/2021  ? MCV 90.0 07/22/2021  ? PLT 309 07/22/2021  ? ? ? ?STUDIES: ?CT ABDOMEN PELVIS W CONTRAST ? ?Result Date: 07/07/2021 ?CLINICAL DATA:  Abdominal pain and elevated bilirubin, initial encounter EXAM: CT ABDOMEN AND PELVIS WITH CONTRAST TECHNIQUE: Multidetector CT imaging of the abdomen and pelvis was performed using the standard protocol following bolus  administration of intravenous contrast. RADIATION DOSE REDUCTION: This exam was performed according to the departmental dose-optimization program which includes automated exposure control, adjustment of the mA and/or kV according to patient size and/or use of iterative reconstruction technique. CONTRAST:  168m OMNIPAQUE IOHEXOL 300 MG/ML  SOLN COMPARISON:  None. FINDINGS: Lower chest: No acute abnormality. Hepatobiliary: Gallbladder is well distended without definitive cholelithiasis. Diffuse intrahepatic biliary ductal dilatation is seen. No hepatic mass is seen. Pancreas: Body and tail of the pancreas are within normal limits. The pancreatic head is mildly prominent and there is a focal 15 mm hypodense partially enhancing lesion in the head of the pancreas best seen on image number 40 of series 2. This lies adjacent to the abrupt tapering of the common bile duct in the pancreatic head and is suspicious for pancreatic neoplasm. Spleen: Normal in size without focal abnormality. Adrenals/Urinary Tract: Adrenal glands are within normal limits. Kidneys demonstrate a normal enhancement pattern bilaterally. No renal calculi are noted.  Normal excretion is noted on delayed images. The bladder is partially distended. Stomach/Bowel: Scattered diverticular change of the colon is noted. The appendix is within normal limits. No obstructive changes ar

## 2021-07-22 ENCOUNTER — Inpatient Hospital Stay: Payer: Medicare Other

## 2021-07-22 ENCOUNTER — Encounter: Payer: Self-pay | Admitting: Oncology

## 2021-07-22 ENCOUNTER — Inpatient Hospital Stay: Payer: Medicare Other | Attending: Oncology | Admitting: Oncology

## 2021-07-22 VITALS — BP 181/74 | HR 81 | Temp 98.2°F | Resp 16 | Ht 66.0 in | Wt 140.6 lb

## 2021-07-22 DIAGNOSIS — R978 Other abnormal tumor markers: Secondary | ICD-10-CM

## 2021-07-22 DIAGNOSIS — K869 Disease of pancreas, unspecified: Secondary | ICD-10-CM | POA: Insufficient documentation

## 2021-07-22 DIAGNOSIS — D72819 Decreased white blood cell count, unspecified: Secondary | ICD-10-CM | POA: Insufficient documentation

## 2021-07-22 DIAGNOSIS — K8689 Other specified diseases of pancreas: Secondary | ICD-10-CM

## 2021-07-22 DIAGNOSIS — K838 Other specified diseases of biliary tract: Secondary | ICD-10-CM | POA: Diagnosis not present

## 2021-07-22 DIAGNOSIS — R7401 Elevation of levels of liver transaminase levels: Secondary | ICD-10-CM | POA: Diagnosis not present

## 2021-07-22 LAB — COMPREHENSIVE METABOLIC PANEL
ALT: 102 U/L — ABNORMAL HIGH (ref 0–44)
AST: 93 U/L — ABNORMAL HIGH (ref 15–41)
Albumin: 3.7 g/dL (ref 3.5–5.0)
Alkaline Phosphatase: 437 U/L — ABNORMAL HIGH (ref 38–126)
Anion gap: 7 (ref 5–15)
BUN: 12 mg/dL (ref 8–23)
CO2: 30 mmol/L (ref 22–32)
Calcium: 9.6 mg/dL (ref 8.9–10.3)
Chloride: 99 mmol/L (ref 98–111)
Creatinine, Ser: 0.74 mg/dL (ref 0.44–1.00)
GFR, Estimated: >60 mL/min (ref >60)
Glucose, Bld: 122 mg/dL — ABNORMAL HIGH (ref 70–99)
Potassium: 4.2 mmol/L (ref 3.5–5.1)
Sodium: 136 mmol/L (ref 135–145)
Total Bilirubin: 2.6 mg/dL — ABNORMAL HIGH (ref 0.3–1.2)
Total Protein: 8.7 g/dL — ABNORMAL HIGH (ref 6.5–8.1)

## 2021-07-22 LAB — CBC WITH DIFFERENTIAL/PLATELET
Abs Immature Granulocytes: 0.01 10*3/uL (ref 0.00–0.07)
Basophils Absolute: 0 10*3/uL (ref 0.0–0.1)
Basophils Relative: 1 %
Eosinophils Absolute: 0.1 10*3/uL (ref 0.0–0.5)
Eosinophils Relative: 2 %
HCT: 37.6 % (ref 36.0–46.0)
Hemoglobin: 12.3 g/dL (ref 12.0–15.0)
Immature Granulocytes: 0 %
Lymphocytes Relative: 33 %
Lymphs Abs: 1.1 10*3/uL (ref 0.7–4.0)
MCH: 29.4 pg (ref 26.0–34.0)
MCHC: 32.7 g/dL (ref 30.0–36.0)
MCV: 90 fL (ref 80.0–100.0)
Monocytes Absolute: 0.3 10*3/uL (ref 0.1–1.0)
Monocytes Relative: 10 %
Neutro Abs: 1.9 10*3/uL (ref 1.7–7.7)
Neutrophils Relative %: 54 %
Platelets: 309 10*3/uL (ref 150–400)
RBC: 4.18 MIL/uL (ref 3.87–5.11)
RDW: 15.5 % (ref 11.5–15.5)
WBC: 3.4 10*3/uL — ABNORMAL LOW (ref 4.0–10.5)
nRBC: 0 % (ref 0.0–0.2)

## 2021-07-22 NOTE — Progress Notes (Signed)
Pt has questions about the ERCP. Pt also reports belching without relief from OTC meds. Pt reports fatigue relieved by rest.  ?

## 2021-07-23 LAB — CANCER ANTIGEN 19-9: CA 19-9: 62 U/mL — ABNORMAL HIGH (ref 0–35)

## 2021-07-31 DIAGNOSIS — K829 Disease of gallbladder, unspecified: Secondary | ICD-10-CM | POA: Diagnosis not present

## 2021-07-31 DIAGNOSIS — Z9689 Presence of other specified functional implants: Secondary | ICD-10-CM | POA: Diagnosis not present

## 2021-07-31 DIAGNOSIS — R935 Abnormal findings on diagnostic imaging of other abdominal regions, including retroperitoneum: Secondary | ICD-10-CM | POA: Diagnosis not present

## 2021-07-31 DIAGNOSIS — K838 Other specified diseases of biliary tract: Secondary | ICD-10-CM | POA: Diagnosis not present

## 2021-07-31 DIAGNOSIS — D649 Anemia, unspecified: Secondary | ICD-10-CM | POA: Diagnosis not present

## 2021-07-31 DIAGNOSIS — K831 Obstruction of bile duct: Secondary | ICD-10-CM | POA: Diagnosis not present

## 2021-07-31 DIAGNOSIS — Z4659 Encounter for fitting and adjustment of other gastrointestinal appliance and device: Secondary | ICD-10-CM | POA: Diagnosis not present

## 2021-07-31 DIAGNOSIS — K828 Other specified diseases of gallbladder: Secondary | ICD-10-CM | POA: Diagnosis not present

## 2021-07-31 DIAGNOSIS — K8689 Other specified diseases of pancreas: Secondary | ICD-10-CM | POA: Diagnosis not present

## 2021-07-31 DIAGNOSIS — Z9889 Other specified postprocedural states: Secondary | ICD-10-CM | POA: Diagnosis not present

## 2021-07-31 NOTE — Progress Notes (Deleted)
[place]  Telephone:([phone] Fax:(336) [phone]  ID: Meredith Wilcox OB: 1940/09/01  MR#: [id number]  CSN#:[id number]  Patient Care Team: [name], MD as PCP - General (Family Medicine) Birder Robson, MD as Consulting Physician (Ophthalmology)  CHIEF COMPLAINT: Pancreatic mass  INTERVAL HISTORY: Patient returns to clinic today for hospital follow-up and further evaluation. She has symptoms of reflux, but otherwise feels well.  She denies any further abdominal pain. She has no neurologic complaints.  She denies any recent fevers or illnesses.  She has a fair appetite, but denies weight loss.  She has no chest pain, shortness of breath, cough, or hemoptysis.  She has no nausea, vomiting, constipation, or diarrhea. She has no urinary complaints.  Patient offers no further specific complaints today.    REVIEW OF SYSTEMS:   Review of Systems  Constitutional: Negative.  Negative for fever, malaise/fatigue and weight loss.  Respiratory: Negative.  Negative for cough, hemoptysis and shortness of breath.   Cardiovascular: Negative.  Negative for chest pain and leg swelling.  Gastrointestinal:  Positive for heartburn. Negative for abdominal pain.  Genitourinary: Negative.  Negative for dysuria.  Musculoskeletal: Negative.  Negative for back pain.  Skin: Negative.  Negative for rash.  Neurological: Negative.  Negative for dizziness, seizures, weakness and headaches.  Psychiatric/Behavioral: Negative.  The patient is not nervous/anxious.    As per HPI. Otherwise, a complete review of systems is negative.  PAST MEDICAL HISTORY: Past Medical History:  Diagnosis Date   Anemia    H/O DURING PREGNANCY   Arthritis    Chronic kidney disease    KIDNEY PROBLEMS AROUND AGE 21   Geographic tongue    GERD (gastroesophageal reflux disease)    H/O NO MEDS   Helicobacter pylori gastrointestinal tract infection    Hyperlipidemia    Incontinence    Indigestion     Prolapse of vaginal walls    Right sided sciatica    Tachycardia    Urethral prolapse     PAST SURGICAL HISTORY: Past Surgical History:  Procedure Laterality Date   BREAST BIOPSY Left 10/11/2016   left breast stereo/ VASCULAR LESION   BREAST BIOPSY Left 12/02/2016   Procedure: NEEDLE LOCALIZATION;  Surgeon: [name], MD;  Location: [facility] ORS;  Service: General;  Laterality: Left;   BREAST EXCISIONAL BIOPSY Left 12/02/2016   left lumpectomy   BREAST LUMPECTOMY Left 12/02/2016   Procedure: EXCISION BREAST MASS;  Surgeon: [name], MD;  Location: [facility] ORS;  Service: General;  Laterality: Left;   DILATION AND CURETTAGE OF UTERUS     dnc     1983   ERCP N/A 07/07/2021   Procedure: ENDOSCOPIC RETROGRADE CHOLANGIOPANCREATOGRAPHY (ERCP);  Surgeon: [name], MD;  Location: [facility] ENDOSCOPY;  Service: Endoscopy;  Laterality: N/A;   fatty tumor removal Right    hand    FAMILY HISTORY: Family History  Problem Relation Age of Onset   Diabetes Mother    Hypertension Mother    Cardiomyopathy Sister    Down syndrome Sister    Leukemia Brother    Alcohol abuse Son        youngest son   Asthma Son        youngest son   Rheum arthritis Sister    Drug abuse Child 61   Breast cancer Neg Hx    Kidney cancer Neg Hx    Bladder Cancer Neg Hx    Prostate cancer Neg Hx     ADVANCED DIRECTIVES (Y/N):  N  HEALTH MAINTENANCE: Social History   Tobacco Use   Smoking status: Never   Smokeless tobacco: Never   Tobacco comments:    smoking cessation materials not required  Vaping Use   Vaping Use: Never used  Substance Use Topics   Alcohol use: No    Alcohol/week: 0.0 standard drinks   Drug use: No     Colonoscopy:  PAP:  Bone density:  Lipid panel:  Allergies  Allergen Reactions   Tussionex Pennkinetic Er [Hydrocod Poli-Chlorphe Poli Er] Other (See Comments)    Real dizzy headed even after a while still walking every direction but straight, patient  stated she stopped taking it because of that.   Hydrocodone     Light headed and dizziness    Current Outpatient Medications  Medication Sig Dispense Refill   Ascorbic Acid (VITAMIN C) 1000 MG tablet Take 1,000 mg by mouth daily. (Patient not taking: Reported on 07/22/2021)     Cholecalciferol (VITAMIN D) 2000 UNITS tablet Take 2,000 Units by mouth daily. Reported on 06/04/2015 (Patient not taking: Reported on 07/22/2021)     Multiple Vitamins-Minerals ([name] PO) Take 1 tablet by mouth daily.  (Patient not taking: Reported on 07/22/2021)     Omega-3 Fatty Acids (OMEGA-3 PO) Take 1 capsule by mouth 2 (two) times daily. Once to twice daily (pt sometimes forgets 2nd dose) (Patient not taking: Reported on 07/22/2021)     ondansetron (ZOFRAN) 4 MG tablet Take 1 tablet (4 mg total) by mouth every 6 (six) hours as needed for nausea. (Patient not taking: Reported on 07/22/2021) 20 tablet 0   TURMERIC PO Take 1 capsule by mouth daily.  (Patient not taking: Reported on 07/22/2021)     No current facility-administered medications for this visit.    OBJECTIVE: There were no vitals filed for this visit.    There is no height or weight on file to calculate BMI.    ECOG FS:0 - Asymptomatic  General: Well-developed, well-nourished, no acute distress. Eyes: Pink conjunctiva, anicteric sclera. HEENT: Normocephalic, moist mucous membranes. Lungs: No audible wheezing or coughing. Heart: Regular rate and rhythm. Abdomen: Soft, nontender, no obvious distention. Musculoskeletal: No edema, cyanosis, or clubbing. Neuro: Alert, answering all questions appropriately. Cranial nerves grossly intact. Skin: No rashes or petechiae noted. Psych: Normal affect. Lymphatics: No cervical, calvicular, axillary or inguinal LAD.   LAB RESULTS:  Lab Results  Component Value Date   NA 136 07/22/2021   K 4.2 07/22/2021   CL 99 07/22/2021   CO2 30 07/22/2021   GLUCOSE 122 (H) 07/22/2021   BUN 12 07/22/2021    CREATININE 0.74 07/22/2021   CALCIUM 9.6 07/22/2021   PROT 8.7 (H) 07/22/2021   ALBUMIN 3.7 07/22/2021   AST 93 (H) 07/22/2021   ALT 102 (H) 07/22/2021   ALKPHOS 437 (H) 07/22/2021   BILITOT 2.6 (H) 07/22/2021   GFRNONAA >60 07/22/2021   GFRAA 84 08/13/2020    Lab Results  Component Value Date   WBC 3.4 (L) 07/22/2021   NEUTROABS 1.9 07/22/2021   HGB 12.3 07/22/2021   HCT 37.6 07/22/2021   MCV 90.0 07/22/2021   PLT 309 07/22/2021     STUDIES: CT ABDOMEN PELVIS W CONTRAST  Result Date: 07/07/2021 CLINICAL DATA:  Abdominal pain and elevated bilirubin, initial encounter EXAM: CT ABDOMEN AND PELVIS WITH CONTRAST TECHNIQUE: Multidetector CT imaging of the abdomen and pelvis was performed using the standard protocol following bolus administration of intravenous contrast. RADIATION DOSE REDUCTION: This exam was performed according  to the departmental dose-optimization program which includes automated exposure control, adjustment of the mA and/or kV according to patient size and/or use of iterative reconstruction technique. CONTRAST:  117m OMNIPAQUE IOHEXOL 300 MG/ML  SOLN COMPARISON:  None. FINDINGS: Lower chest: No acute abnormality. Hepatobiliary: Gallbladder is well distended without definitive cholelithiasis. Diffuse intrahepatic biliary ductal dilatation is seen. No hepatic mass is seen. Pancreas: Body and tail of the pancreas are within normal limits. The pancreatic head is mildly prominent and there is a focal 15 mm hypodense partially enhancing lesion in the head of the pancreas best seen on image number 40 of series 2. This lies adjacent to the abrupt tapering of the common bile duct in the pancreatic head and is suspicious for pancreatic neoplasm. Spleen: Normal in size without focal abnormality. Adrenals/Urinary Tract: Adrenal glands are within normal limits. Kidneys demonstrate a normal enhancement pattern bilaterally. No renal calculi are noted. Normal excretion is noted on delayed  images. The bladder is partially distended. Stomach/Bowel: Scattered diverticular change of the colon is noted. The appendix is within normal limits. No obstructive changes are seen. Small bowel and stomach are within normal limits. Vascular/Lymphatic: Aortic atherosclerosis. No enlarged abdominal or pelvic lymph nodes. Reproductive: Large calcified uterine fibroid is noted. No definitive adnexal mass is seen. Other: No abdominal wall hernia or abnormality. No abdominopelvic ascites. Musculoskeletal: Degenerative changes of lumbar spine are noted. No compression deformity is seen. IMPRESSION: Diffuse intrahepatic and extrahepatic biliary ductal dilatation with abrupt tapering in the region of the head of the pancreas. Adjacent to this there is a 15 mm hypodense lesion within the head of the pancreas suspicious for pancreatic neoplasm. ERCP with tissue sampling as well as EUS may be helpful for further diagnosis. 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 Uterine fibroid Electronically Signed   By: MInez CatalinaM.D.   On: 07/07/2021 02:19   DG C-Arm 1-60 Min-No Report  Result Date: 07/07/2021 Fluoroscopy was utilized by the requesting physician.  No radiographic interpretation.    ASSESSMENT: Pancreatic mass.  PLAN:    1.  Pancreatic mass: CT scan results from July 07, 2021 reviewed independently and reported as above with a 1.5 cm lesion in the head of the pancreas suspicious for underlying malignancy.  Patient underwent ERCP with stenting with improvement of her hyperbilirubinemia.  [facility]  CA 19-9 is only mildly elevated at 94, repeat testing from today is pending at time of dictation.  Return to clinic on August 06, 2021 for further evaluation and discussion of her biopsy results.  2.  Hyperbilirubinemia: Although patient's bilirubin remains elevated at 2.6, this is significantly improved since discharge.  Continue to monitor.   Stenting as above.  3.  Transaminitis: AST and ALT also remain elevated, but improved since discharge.  4.  Leukopenia: Mild, monitor.   Patient expressed understanding and was in agreement with this plan. She also understands that She can call clinic at any time with any questions, concerns, or complaints.    Cancer Staging  No matching staging information was found for the patient.  TLloyd Huger MD   07/31/2021 5:15 PM

## 2021-08-04 ENCOUNTER — Ambulatory Visit: Payer: Medicare Other | Admitting: Oncology

## 2021-08-05 ENCOUNTER — Ambulatory Visit: Payer: Self-pay

## 2021-08-05 NOTE — Telephone Encounter (Signed)
Summary: chills. no appetite  ? Patient has no appetite, experiencing chills and nausea and  spouse seeking clinical advice   ?  ?Attempted to call pt. Phone rang but no VM to LM on. ?

## 2021-08-05 NOTE — Telephone Encounter (Signed)
Attempted to contact pt and got a recording to check the number and try again. ?

## 2021-08-05 NOTE — Telephone Encounter (Signed)
Patient's husband called, no answer, call could not be completed at this time per recording. Unable to reach patient after 3 attempts by [facility] NT, routing to the provider for resolution per protocol. ? ? ? ? ?Summary: chills. no appetite  ? Patient has no appetite, experiencing chills and nausea and  spouse seeking clinical advice   ?  ? ?

## 2021-08-06 ENCOUNTER — Inpatient Hospital Stay: Payer: Medicare Other | Admitting: Oncology

## 2021-08-06 DIAGNOSIS — K8689 Other specified diseases of pancreas: Secondary | ICD-10-CM

## 2021-08-06 NOTE — Telephone Encounter (Signed)
Tried calling the home phone and it just rang no vm came on then I tried the cell and lvm stating to give [name] a call and we would try and set up an appt.

## 2021-08-07 ENCOUNTER — Telehealth: Payer: Self-pay

## 2021-08-07 ENCOUNTER — Other Ambulatory Visit: Payer: Self-pay

## 2021-08-07 DIAGNOSIS — R978 Other abnormal tumor markers: Secondary | ICD-10-CM

## 2021-08-07 DIAGNOSIS — K8689 Other specified diseases of pancreas: Secondary | ICD-10-CM

## 2021-08-07 NOTE — Telephone Encounter (Signed)
Call placed to Meredith Wilcox to review recommendations following EUS. We will arrange repeat imaging in 6-8 weeks/lab/med onc follow up. No answer with no voicemail available. Will call again at a later time. ?

## 2021-08-10 ENCOUNTER — Telehealth: Payer: Self-pay

## 2021-08-10 NOTE — Telephone Encounter (Signed)
Second call placed to Meredith Wilcox on home and mobile number. No answer. No voicemail. Will continue to try and contact to arrange follow up imaging/md visit. ?

## 2021-08-11 ENCOUNTER — Telehealth: Payer: Self-pay

## 2021-08-11 NOTE — Telephone Encounter (Signed)
Spoke with Meredith Wilcox. She was made aware of recommendation for repeat imaging 6-8 weeks following EUS. Orders have been placed and she will see Dr. Grayland Ormond for results and repeat tumor marker. ?

## 2021-08-12 NOTE — Progress Notes (Signed)
Name: Meredith Wilcox   MRN: [id number]    DOB: 09-22-1940   Date:08/13/2021 ? ?     Progress Note ? ?Subjective ? ?Chief Complaint ? ?Follow up  ? ?HPI ? ?Anemia: last HCT done at [facility] on 07/15/21 and it was 11.5, white count has improved, but long history of leucopenia  ? 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 Seen by GI at [facility] and had Endocosopic [name] but I am unable to see results. She has follow up with oncologist in one month and repeat CT already scheduled  ? ?Malnutrition: she has lost 12 lbs since Feb 2023, discussed high calorie diet, getting evaluated for pancreatic mass , almost 10 % in 3 months  ?  ?Cystocele : does not like pessary, seen by Urologist last visit 05/2019 she is using estrogen cream three times a week,, but stopped due to pancreatic mass, she saw  Dr. Matilde Sprang discussed hysterectomy and cystocele repair. She states she is not ready for surgery at this time. She still has urinary urgency and frequency ?  ?History of DVT: Spring of 2019, resolved on repeat doppler US 10/2017. No problems since. She denies leg pain, swelling or SOB. Unchanged ? ?Seasonal allergies: she does not like taking medications, only drinking tea and lemon, she has to clear her throat  ? ?Left  knee OA: symptoms are mild intermittent, not taking medication, no swelling, redness and is not affecting her level of activity . Feeling well  ? ?She continues to refuse vaccinations, mammograms or bone density  ? ?Patient Active Problem List  ? Diagnosis Date Noted  ? Obstructive jaundice 07/07/2021  ? Abnormal CT of the pancreas 07/07/2021  ? GERD (gastroesophageal reflux disease) 07/07/2021  ? Elevated blood pressure reading with diagnosis of  hypertension 07/07/2021  ? Pancreatic mass   ? Vaginal discharge 01/27/2017  ? 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 Vaginal atrophy 12/21/2016  ? 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 History of Helicobacter pylori infection 11/16/2014  ? 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 Incontinence 11/16/2014  ? 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 ? ?Past Surgical History:  ?Procedure Laterality Date  ? BREAST BIOPSY Left 10/11/2016  ? left breast stereo/ VASCULAR LESION  ? BREAST BIOPSY Left 12/02/2016  ? Procedure: NEEDLE LOCALIZATION;  Surgeon: [name], MD;  Location: [facility] ORS;  Service: General;  Laterality: Left;  ? BREAST EXCISIONAL BIOPSY Left 12/02/2016  ? left lumpectomy  ? BREAST LUMPECTOMY Left 12/02/2016  ? Procedure: EXCISION BREAST MASS;  Surgeon: [name], MD;  Location: [facility] ORS;  Service: General;  Laterality: Left;  ? DILATION AND CURETTAGE OF UTERUS    ? dnc    ? 10  ? ERCP N/A 07/07/2021  ? Procedure: ENDOSCOPIC RETROGRADE CHOLANGIOPANCREATOGRAPHY (ERCP);  Surgeon: [name], MD;  Location: [facility] ENDOSCOPY;  Service: Endoscopy;  Laterality: N/A;  ? fatty tumor removal Right   ? hand  ? ? ?Family History  ?Problem Relation Age of Onset  ? Diabetes Mother   ? Hypertension Mother   ? Cardiomyopathy Sister   ? Down syndrome Sister   ? Leukemia Brother   ? Alcohol  abuse Son   ?     youngest son  ? Asthma Son   ?     youngest son  ? Rheum arthritis Sister   ? Drug abuse Child 23  ? Breast cancer Neg Hx   ? Kidney cancer Neg Hx   ? Bladder Cancer Neg Hx   ? Prostate cancer Neg Hx   ? ? ?Social History  ? ?Tobacco Use  ? Smoking status: Never  ? Smokeless tobacco: Never  ? Tobacco comments:  ?  smoking cessation materials not required  ?Substance Use Topics  ? Alcohol use: No  ?  Alcohol/week: 0.0 standard drinks  ? ? ?No current outpatient medications on file. ? ?Allergies  ?Allergen  Reactions  ? Tussionex Pennkinetic Er [Hydrocod Poli-Chlorphe Poli Er] Other (See Comments)  ?  Real dizzy headed even after a while still walking every direction but straight, patient stated she stopped taking it because of that.  ? Hydrocodone   ?  Light headed and dizziness  ? ? ?I personally reviewed active problem list, medication list, allergies, family history, social history with the patient/caregiver today. ? ? ?ROS ? ?Constitutional: Negative for fever , positive for weight change.  ?Respiratory: Negative for cough and shortness of breath.   ?Cardiovascular: Negative for chest pain or palpitations.  ?Gastrointestinal: Negative for abdominal pain, no bowel changes.  ?Musculoskeletal: Negative for gait problem or joint swelling.  ?Skin: Negative for rash.  ?Neurological: Negative for dizziness or headache.  ?No other specific complaints in a complete review of systems (except as listed in HPI above).  ? ?Objective ? ?Vitals:  ? 08/13/21 0958  ?BP: (!) 162/72  ?Pulse: 74  ?Resp: 14  ?Temp: (!) 97.5 ?F (36.4 ?C)  ?TempSrc: Oral  ?SpO2: 94%  ?Weight: 138 lb 11.2 oz (62.9 kg)  ?Height: '5\' 6"'$  (1.676 m)  ? ? ?Body mass index is 22.39 kg/m?. ? ?Physical Exam ? ?Constitutional: Patient appears well-developed and well-nourished.  No distress.  ?HEENT: head atraumatic, normocephalic, pupils equal and reactive to light, neck supple ?Cardiovascular: Normal rate, regular rhythm and normal heart sounds.  No murmur heard. No BLE edema. ?Pulmonary/Chest: Effort normal and breath sounds normal. No respiratory distress. ?Abdominal: Soft.  There is no tenderness. ?Psychiatric: Patient has a normal mood and affect. behavior is normal. Judgment and thought content normal.  ? ?Recent Results (from the past 2160 hour(s))  ?Novel Coronavirus, NAA ([facility])     Status: None  ? Collection Time: 05/18/21 12:00 AM  ? Specimen: Nasopharyngeal(NP) swabs in vial transport medium  ? Nasopharynge  Previous  ?Result Value Ref Range  ?  SARS-CoV-2, NAA Not Detected Not Detected  ?  Comment: This nucleic acid amplification test was developed and its performance ?characteristics determined by [employer]. Nucleic acid ?amplification tests include RT-PCR and TMA. This test has not been ?FDA cleared or approved. This test has been authorized by FDA under ?an Emergency Use Authorization (EUA). This test is only authorized ?for the duration of time the declaration that circumstances exist ?justifying the authorization of the emergency use of in vitro ?diagnostic tests for detection of SARS-CoV-2 virus and/or diagnosis ?of COVID-19 infection under section 564(b)(1) of the Act, 21 U.S.C. ?360bbb-3(b) (1), unless the authorization is terminated or revoked ?sooner. ?When diagnostic testing is negative, the possibility of a false ?negative result should be considered in the context of a patient's ?recent exposures and the presence of clinical signs and symptoms ?consistent with COVID-19. An individual without symptoms of  COVID-19 ?and who is not shedding SARS-CoV-2 virus wo uld expect to have a ?negative (not detected) result in this assay. ?  ?SARS-COV-2, NAA 2 DAY TAT     Status: None  ? Collection Time: 05/18/21 12:00 AM  ? Nasopharynge  Previous  ?Result Value Ref Range  ? SARS-CoV-2, NAA 2 DAY TAT Performed   ?POCT Urinalysis Dipstick     Status: Abnormal  ? Collection Time: 07/06/21  9:08 AM  ?Result Value Ref Range  ? Color, UA Dark Yellow   ? Clarity, UA Dark cloudy   ? Glucose, UA Negative Negative  ? Bilirubin, UA Positive   ?  Comment: Large  ? Ketones, UA Negative   ? Spec Grav, UA 1.025 1.010 - 1.025  ? Blood, UA Trace   ? pH, UA 5.0 5.0 - 8.0  ? Protein, UA Negative Negative  ? Urobilinogen, UA 0.2 0.2 or 1.0 E.U./dL  ? Nitrite, UA Negative   ? Leukocytes, UA Trace (A) Negative  ? Appearance Dark Yellow   ? Odor Foul   ?Urine Culture     Status: None  ? Collection Time: 07/06/21 10:07 AM  ? Specimen: Urine  ?Result Value Ref Range  ?  MICRO NUMBER: [id number]   ? SPECIMEN QUALITY: Adequate   ? Sample Source URINE   ? STATUS: FINAL   ? Result: No Growth   ?COMPLETE METABOLIC PANEL WITH GFR     Status: Abnormal  ? Collection Time: 07/06/21 10:07 AM  ?Resul

## 2021-08-13 ENCOUNTER — Encounter: Payer: Self-pay | Admitting: Family Medicine

## 2021-08-13 ENCOUNTER — Ambulatory Visit (INDEPENDENT_AMBULATORY_CARE_PROVIDER_SITE_OTHER): Payer: Medicare Other | Admitting: Family Medicine

## 2021-08-13 VITALS — BP 156/74 | HR 74 | Temp 97.5°F | Resp 14 | Ht 66.0 in | Wt 138.7 lb

## 2021-08-13 DIAGNOSIS — E441 Mild protein-calorie malnutrition: Secondary | ICD-10-CM | POA: Diagnosis not present

## 2021-08-13 DIAGNOSIS — K8689 Other specified diseases of pancreas: Secondary | ICD-10-CM

## 2021-08-13 DIAGNOSIS — I10 Essential (primary) hypertension: Secondary | ICD-10-CM

## 2021-08-13 DIAGNOSIS — Z1231 Encounter for screening mammogram for malignant neoplasm of breast: Secondary | ICD-10-CM

## 2021-08-13 DIAGNOSIS — Z23 Encounter for immunization: Secondary | ICD-10-CM

## 2021-08-13 DIAGNOSIS — D708 Other neutropenia: Secondary | ICD-10-CM

## 2021-08-13 DIAGNOSIS — J302 Other seasonal allergic rhinitis: Secondary | ICD-10-CM | POA: Diagnosis not present

## 2021-08-13 DIAGNOSIS — Z13820 Encounter for screening for osteoporosis: Secondary | ICD-10-CM

## 2021-08-13 DIAGNOSIS — Z1159 Encounter for screening for other viral diseases: Secondary | ICD-10-CM

## 2021-08-13 DIAGNOSIS — N8189 Other female genital prolapse: Secondary | ICD-10-CM

## 2021-08-13 DIAGNOSIS — D649 Anemia, unspecified: Secondary | ICD-10-CM

## 2021-08-13 MED ORDER — AMLODIPINE BESYLATE 2.5 MG PO TABS: 2.5000 mg | ORAL_TABLET | Freq: Every evening | ORAL | 0 refills | Status: DC

## 2021-08-13 NOTE — Patient Instructions (Signed)
High-Protein and High-Calorie Diet Eating high-protein and high-calorie foods can help you to gain weight, heal after an injury, and recover after an illness or surgery. The specific amount of daily protein and calories you need depends on: Your body weight. The reason this diet is recommended for you. Generally, a high-protein, high-calorie diet involves: Eating 250-500 extra calories each day. Making sure that you get enough of your daily calories from protein. Ask your health care provider how many of your calories should come from protein. Talk with a health care provider or a dietitian about how much protein and how many calories you need each day. Follow the diet as directed by your health care provider. What are tips for following this plan?  Reading food labels Check the nutrition facts label for calories, grams of fat and protein. Items with more than 4 grams of protein are high-protein foods. Preparing meals Add whole milk, half-and-half, or heavy cream to cereal, pudding, soup, or hot cocoa. Add whole milk to instant breakfast drinks. Add peanut butter to oatmeal or smoothies. Add powdered milk to baked goods, smoothies, or milkshakes. Add powdered milk, cream, or butter to mashed potatoes. Add cheese to cooked vegetables. Make whole-milk yogurt parfaits. Top them with granola, fruit, or nuts. Add cottage cheese to fruit. Add avocado, cheese, or both to sandwiches or salads. Add avocado to smoothies. Add meat, poultry, or seafood to rice, pasta, casseroles, salads, and soups. Use mayonnaise when making egg salad, chicken salad, or tuna salad. Use peanut butter as a dip for fruits and vegetables or as a topping for pretzels, celery, or crackers. Add beans to casseroles, dips, and spreads. Add pureed beans to sauces and soups. Replace calorie-free drinks with calorie-containing drinks, such as milk and fruit juice. Replace water with milk or heavy cream when making foods such as  oatmeal, pudding, or cocoa. Add oil or butter to cooked vegetables and grains. Add cream cheese to sandwiches or as a topping on crackers and bread. Make cream-based pastas and soups. General information Ask your health care provider if you should take a nutritional supplement. Try to eat six small meals each day instead of three large meals. A general goal is to eat every 2 to 3 hours. Eat a balanced diet. In each meal, include one food that is high in protein and one food with fat in it. Keep nutritious snacks available, such as nuts, trail mixes, dried fruit, and yogurt. If you have kidney disease or diabetes, talk with your health care provider about how much protein is safe for you. Too much protein may put extra stress on your kidneys. Drink your calories. Choose high-calorie drinks and have them after your meals. Consider setting a timer to remind you to eat. You will want to eat even if you do not feel very hungry. What high-protein foods should I eat?  Vegetables Soybeans. Peas. Grains Quinoa. Bulgur wheat. Buckwheat. Meats and other proteins Beef, pork, and poultry. Fish and seafood. Eggs. Tofu. Textured vegetable protein (TVP). Peanut butter. Nuts and seeds. Dried beans. Protein powders. Hummus. Dairy Whole milk. Whole-milk yogurt. Powdered milk. Cheese. Cottage Cheese. Eggnog. Beverages High-protein supplement drinks. Soy milk. Other foods Protein bars. The items listed above may not be a complete list of foods and beverages you can eat and drink. Contact a dietitian for more information. What high-calorie foods should I eat? Fruits Dried fruit. Fruit leather. Canned fruit in syrup. Fruit juice. Avocado. Vegetables Vegetables cooked in oil or butter. Fried potatoes. Grains   Pasta. Quick breads. Muffins. Pancakes. Ready-to-eat cereal. Meats and other proteins Peanut butter. Nuts and seeds. Dairy Heavy cream. Whipped cream. Cream cheese. Sour cream. Ice cream. Custard.  Pudding. Whole milk dairy products. Beverages Meal-replacement beverages. Nutrition shakes. Fruit juice. Seasonings and condiments Salad dressing. Mayonnaise. Alfredo sauce. Fruit preserves or jelly. Honey. Syrup. Sweets and desserts Cake. Cookies. Pie. Pastries. Candy bars. Chocolate. Fats and oils Butter or margarine. Oil. Gravy. Other foods Meal-replacement bars. The items listed above may not be a complete list of foods and beverages you can eat and drink. Contact a dietitian for more information. Summary A high-protein, high-calorie diet can help you gain weight or heal faster after an injury, illness, or surgery. To increase your protein and calories, add ingredients such as whole milk, peanut butter, cheese, beans, meat, or seafood to meal items. To get enough extra calories each day, include high-calorie foods and beverages at each meal. Adding a high-calorie drink or shake can be an easy way to help you get enough calories each day. Talk with your healthcare provider or dietitian about the best options for you. This information is not intended to replace advice given to you by your health care provider. Make sure you discuss any questions you have with your health care provider. Document Revised: 03/02/2020 Document Reviewed: 03/02/2020 Elsevier Patient Education  2023 Elsevier Inc.  

## 2021-09-14 ENCOUNTER — Ambulatory Visit
Admission: RE | Admit: 2021-09-14 | Discharge: 2021-09-14 | Disposition: A | Discharge status: Home / Self Care | Payer: Medicare Other | Source: Ambulatory Visit | Attending: Oncology | Admitting: Oncology

## 2021-09-14 DIAGNOSIS — K8689 Other specified diseases of pancreas: Secondary | ICD-10-CM | POA: Diagnosis not present

## 2021-09-14 DIAGNOSIS — R978 Other abnormal tumor markers: Secondary | ICD-10-CM

## 2021-09-14 DIAGNOSIS — D259 Leiomyoma of uterus, unspecified: Secondary | ICD-10-CM | POA: Diagnosis not present

## 2021-09-14 DIAGNOSIS — K5730 Diverticulosis of large intestine without perforation or abscess without bleeding: Secondary | ICD-10-CM | POA: Diagnosis not present

## 2021-09-14 LAB — POCT I-STAT CREATININE: Creatinine, Ser: 0.7 mg/dL (ref 0.44–1.00)

## 2021-09-14 MED ORDER — IOHEXOL 300 MG/ML  SOLN
100.0000 mL | Freq: Once | INTRAMUSCULAR | Status: AC | PRN
  Administered 2021-09-14: 80 mL via INTRAVENOUS

## 2021-09-14 NOTE — Progress Notes (Unsigned)
[place]  Telephone:(336) [phone] Fax:([phone]) [phone]  ID: Meredith Wilcox OB: July 30, 1940  MR#: [id number]  AQT#:[id number]  Patient Care Team: [name], MD as PCP - General (Family Medicine) Birder Robson, MD as Consulting Physician (Ophthalmology)  CHIEF COMPLAINT: Pancreatic mass  INTERVAL HISTORY: Patient returns to clinic today for hospital follow-up and further evaluation. She has symptoms of reflux, but otherwise feels well.  She denies any further abdominal pain. She has no neurologic complaints.  She denies any recent fevers or illnesses.  She has a fair appetite, but denies weight loss.  She has no chest pain, shortness of breath, cough, or hemoptysis.  She has no nausea, vomiting, constipation, or diarrhea. She has no urinary complaints.  Patient offers no further specific complaints today.    REVIEW OF SYSTEMS:   Review of Systems  Constitutional: Negative.  Negative for fever, malaise/fatigue and weight loss.  Respiratory: Negative.  Negative for cough, hemoptysis and shortness of breath.   Cardiovascular: Negative.  Negative for chest pain and leg swelling.  Gastrointestinal:  Positive for heartburn. Negative for abdominal pain.  Genitourinary: Negative.  Negative for dysuria.  Musculoskeletal: Negative.  Negative for back pain.  Skin: Negative.  Negative for rash.  Neurological: Negative.  Negative for dizziness, seizures, weakness and headaches.  Psychiatric/Behavioral: Negative.  The patient is not nervous/anxious.    As per HPI. Otherwise, a complete review of systems is negative.  PAST MEDICAL HISTORY: Past Medical History:  Diagnosis Date   Anemia    H/O DURING PREGNANCY   Arthritis    Chronic kidney disease    KIDNEY PROBLEMS AROUND AGE 6   Geographic tongue    GERD (gastroesophageal reflux disease)    H/O NO MEDS   Helicobacter pylori gastrointestinal tract infection    Hyperlipidemia    Incontinence    Indigestion     Prolapse of vaginal walls    Right sided sciatica    Tachycardia    Urethral prolapse     PAST SURGICAL HISTORY: Past Surgical History:  Procedure Laterality Date   BREAST BIOPSY Left 10/11/2016   left breast stereo/ VASCULAR LESION   BREAST BIOPSY Left 12/02/2016   Procedure: NEEDLE LOCALIZATION;  Surgeon: [name], MD;  Location: [facility] ORS;  Service: General;  Laterality: Left;   BREAST EXCISIONAL BIOPSY Left 12/02/2016   left lumpectomy   BREAST LUMPECTOMY Left 12/02/2016   Procedure: EXCISION BREAST MASS;  Surgeon: [name], MD;  Location: [facility] ORS;  Service: General;  Laterality: Left;   DILATION AND CURETTAGE OF UTERUS     dnc     1983   ERCP N/A 07/07/2021   Procedure: ENDOSCOPIC RETROGRADE CHOLANGIOPANCREATOGRAPHY (ERCP);  Surgeon: [name], MD;  Location: [facility] ENDOSCOPY;  Service: Endoscopy;  Laterality: N/A;   fatty tumor removal Right    hand    FAMILY HISTORY: Family History  Problem Relation Age of Onset   Diabetes Mother    Hypertension Mother    Cardiomyopathy Sister    Down syndrome Sister    Leukemia Brother    Alcohol abuse Son        youngest son   Asthma Son        youngest son   Rheum arthritis Sister    Drug abuse Child 13   Breast cancer Neg Hx    Kidney cancer Neg Hx    Bladder Cancer Neg Hx    Prostate cancer Neg Hx     ADVANCED DIRECTIVES (Y/N):  N  HEALTH MAINTENANCE: Social History   Tobacco Use   Smoking status: Never   Smokeless tobacco: Never   Tobacco comments:    smoking cessation materials not required  Vaping Use   Vaping Use: Never used  Substance Use Topics   Alcohol use: No    Alcohol/week: 0.0 standard drinks   Drug use: No     Colonoscopy:  PAP:  Bone density:  Lipid panel:  Allergies  Allergen Reactions   Tussionex Pennkinetic Er [Hydrocod Poli-Chlorphe Poli Er] Other (See Comments)    Real dizzy headed even after a while still walking every direction but straight, patient  stated she stopped taking it because of that.   Hydrocodone     Light headed and dizziness    Current Outpatient Medications  Medication Sig Dispense Refill   amLODipine (NORVASC) 2.5 MG tablet Take 1 tablet (2.5 mg total) by mouth every evening. 90 tablet 0   No current facility-administered medications for this visit.    OBJECTIVE: There were no vitals filed for this visit.    There is no height or weight on file to calculate BMI.    ECOG FS:0 - Asymptomatic  General: Well-developed, well-nourished, no acute distress. Eyes: Pink conjunctiva, anicteric sclera. HEENT: Normocephalic, moist mucous membranes. Lungs: No audible wheezing or coughing. Heart: Regular rate and rhythm. Abdomen: Soft, nontender, no obvious distention. Musculoskeletal: No edema, cyanosis, or clubbing. Neuro: Alert, answering all questions appropriately. Cranial nerves grossly intact. Skin: No rashes or petechiae noted. Psych: Normal affect. Lymphatics: No cervical, calvicular, axillary or inguinal LAD.   LAB RESULTS:  Lab Results  Component Value Date   NA 136 07/22/2021   K 4.2 07/22/2021   CL 99 07/22/2021   CO2 30 07/22/2021   GLUCOSE 122 (H) 07/22/2021   BUN 12 07/22/2021   CREATININE 0.70 09/14/2021   CALCIUM 9.6 07/22/2021   PROT 8.7 (H) 07/22/2021   ALBUMIN 3.7 07/22/2021   AST 93 (H) 07/22/2021   ALT 102 (H) 07/22/2021   ALKPHOS 437 (H) 07/22/2021   BILITOT 2.6 (H) 07/22/2021   GFRNONAA >60 07/22/2021   GFRAA 84 08/13/2020    Lab Results  Component Value Date   WBC 3.4 (L) 07/22/2021   NEUTROABS 1.9 07/22/2021   HGB 12.3 07/22/2021   HCT 37.6 07/22/2021   MCV 90.0 07/22/2021   PLT 309 07/22/2021     STUDIES: No results found.  ASSESSMENT: Pancreatic mass.  PLAN:    1.  Pancreatic mass: CT scan results from July 07, 2021 reviewed independently and reported as above with a 1.5 cm lesion in the head of the pancreas suspicious for underlying malignancy.  Patient  underwent ERCP with stenting with improvement of her hyperbilirubinemia.  EUS for biopsy is scheduled for July 31, 2021 at [facility]  CA 19-9 is only mildly elevated at 94, repeat testing from today is pending at time of dictation.  Return to clinic on August 06, 2021 for further evaluation and discussion of her biopsy results.  2.  Hyperbilirubinemia: Although patient's bilirubin remains elevated at 2.6, this is significantly improved since discharge.  Continue to monitor.  Stenting as above.  3.  Transaminitis: AST and ALT also remain elevated, but improved since discharge.  4.  Leukopenia: Mild, monitor.   Patient expressed understanding and was in agreement with this plan. She also understands that She can call clinic at any time with any questions, concerns, or complaints.    Cancer Staging  No matching staging information was found  for the patient.  Lloyd Huger, MD   09/14/2021 10:16 AM

## 2021-09-15 ENCOUNTER — Other Ambulatory Visit: Payer: Self-pay

## 2021-09-15 DIAGNOSIS — K8689 Other specified diseases of pancreas: Secondary | ICD-10-CM

## 2021-09-16 ENCOUNTER — Encounter: Payer: Self-pay | Admitting: Oncology

## 2021-09-16 ENCOUNTER — Inpatient Hospital Stay: Payer: Medicare Other | Attending: Oncology

## 2021-09-16 ENCOUNTER — Other Ambulatory Visit: Payer: Self-pay

## 2021-09-16 ENCOUNTER — Inpatient Hospital Stay (HOSPITAL_BASED_OUTPATIENT_CLINIC_OR_DEPARTMENT_OTHER): Payer: Medicare Other | Admitting: Oncology

## 2021-09-16 VITALS — BP 141/68 | HR 79 | Temp 97.6°F | Resp 17 | Wt 137.0 lb

## 2021-09-16 DIAGNOSIS — K8689 Other specified diseases of pancreas: Secondary | ICD-10-CM

## 2021-09-16 DIAGNOSIS — R978 Other abnormal tumor markers: Secondary | ICD-10-CM | POA: Diagnosis not present

## 2021-09-16 DIAGNOSIS — K5730 Diverticulosis of large intestine without perforation or abscess without bleeding: Secondary | ICD-10-CM | POA: Diagnosis not present

## 2021-09-16 DIAGNOSIS — D72819 Decreased white blood cell count, unspecified: Secondary | ICD-10-CM | POA: Diagnosis not present

## 2021-09-16 DIAGNOSIS — R7401 Elevation of levels of liver transaminase levels: Secondary | ICD-10-CM | POA: Diagnosis not present

## 2021-09-16 DIAGNOSIS — K869 Disease of pancreas, unspecified: Secondary | ICD-10-CM | POA: Diagnosis not present

## 2021-09-16 LAB — CBC WITH DIFFERENTIAL/PLATELET
Abs Immature Granulocytes: 0.01 10*3/uL (ref 0.00–0.07)
Basophils Absolute: 0 10*3/uL (ref 0.0–0.1)
Basophils Relative: 1 %
Eosinophils Absolute: 0.1 10*3/uL (ref 0.0–0.5)
Eosinophils Relative: 3 %
HCT: 37.4 % (ref 36.0–46.0)
Hemoglobin: 12.2 g/dL (ref 12.0–15.0)
Immature Granulocytes: 0 %
Lymphocytes Relative: 39 %
Lymphs Abs: 1.3 10*3/uL (ref 0.7–4.0)
MCH: 29.2 pg (ref 26.0–34.0)
MCHC: 32.6 g/dL (ref 30.0–36.0)
MCV: 89.5 fL (ref 80.0–100.0)
Monocytes Absolute: 0.4 10*3/uL (ref 0.1–1.0)
Monocytes Relative: 12 %
Neutro Abs: 1.5 10*3/uL — ABNORMAL LOW (ref 1.7–7.7)
Neutrophils Relative %: 45 %
Platelets: 203 10*3/uL (ref 150–400)
RBC: 4.18 MIL/uL (ref 3.87–5.11)
RDW: 13.9 % (ref 11.5–15.5)
WBC: 3.2 10*3/uL — ABNORMAL LOW (ref 4.0–10.5)
nRBC: 0 % (ref 0.0–0.2)

## 2021-09-16 LAB — COMPREHENSIVE METABOLIC PANEL
ALT: 53 U/L — ABNORMAL HIGH (ref 0–44)
AST: 116 U/L — ABNORMAL HIGH (ref 15–41)
Albumin: 3.8 g/dL (ref 3.5–5.0)
Alkaline Phosphatase: 272 U/L — ABNORMAL HIGH (ref 38–126)
Anion gap: 7 (ref 5–15)
BUN: 11 mg/dL (ref 8–23)
CO2: 30 mmol/L (ref 22–32)
Calcium: 9.1 mg/dL (ref 8.9–10.3)
Chloride: 98 mmol/L (ref 98–111)
Creatinine, Ser: 0.85 mg/dL (ref 0.44–1.00)
GFR, Estimated: >60 mL/min (ref >60)
Glucose, Bld: 97 mg/dL (ref 70–99)
Potassium: 3.7 mmol/L (ref 3.5–5.1)
Sodium: 135 mmol/L (ref 135–145)
Total Bilirubin: 0.8 mg/dL (ref 0.3–1.2)
Total Protein: 8.3 g/dL — ABNORMAL HIGH (ref 6.5–8.1)

## 2021-09-16 NOTE — Progress Notes (Signed)
Patient here for oncology follow-up appointment, expresses no new complaints or concerns at this time.   

## 2021-09-18 LAB — CANCER ANTIGEN 19-9: CA 19-9: 92 U/mL — ABNORMAL HIGH (ref 0–35)

## 2021-09-29 ENCOUNTER — Telehealth: Payer: Self-pay | Admitting: Gastroenterology

## 2021-09-29 NOTE — Telephone Encounter (Signed)
Pt received letter in reference to her upcoming CT scan as well and was confused if it was from [place] office as well

## 2021-09-29 NOTE — Telephone Encounter (Signed)
Please call patient to verify appointment for ERCP. Patient called very confused and did not understand me.

## 2021-10-01 ENCOUNTER — Other Ambulatory Visit: Payer: Self-pay

## 2021-10-01 ENCOUNTER — Ambulatory Visit: Payer: Medicare Other

## 2021-10-01 ENCOUNTER — Ambulatory Visit: Payer: Medicare Other | Admitting: Certified Registered"

## 2021-10-01 ENCOUNTER — Encounter
Admission: RE | Disposition: A | Discharge status: Home / Self Care | Payer: Self-pay | Source: Home / Self Care | Attending: Gastroenterology

## 2021-10-01 ENCOUNTER — Ambulatory Visit
Admission: RE | Admit: 2021-10-01 | Discharge: 2021-10-01 | Disposition: A | Discharge status: Home / Self Care | Payer: Medicare Other | Attending: Gastroenterology | Admitting: Gastroenterology

## 2021-10-01 ENCOUNTER — Encounter: Payer: Self-pay | Admitting: Gastroenterology

## 2021-10-01 DIAGNOSIS — D490 Neoplasm of unspecified behavior of digestive system: Secondary | ICD-10-CM | POA: Insufficient documentation

## 2021-10-01 DIAGNOSIS — K219 Gastro-esophageal reflux disease without esophagitis: Secondary | ICD-10-CM | POA: Diagnosis not present

## 2021-10-01 DIAGNOSIS — K8689 Other specified diseases of pancreas: Secondary | ICD-10-CM | POA: Diagnosis not present

## 2021-10-01 HISTORY — PX: ERCP: SHX5425

## 2021-10-01 SURGERY — ERCP, WITH INTERVENTION IF INDICATED
Anesthesia: General

## 2021-10-01 MED ORDER — DICLOFENAC SUPPOSITORY 100 MG: 100.0000 mg | Freq: Once | RECTAL | Status: DC

## 2021-10-01 MED ORDER — LIDOCAINE HCL (CARDIAC) PF 100 MG/5ML IV SOSY
PREFILLED_SYRINGE | INTRAVENOUS | Status: DC | PRN
  Administered 2021-10-01: 80 mg via INTRAVENOUS

## 2021-10-01 MED ORDER — PROPOFOL 10 MG/ML IV BOLUS
INTRAVENOUS | Status: AC
  Filled 2021-10-01: qty 40

## 2021-10-01 MED ORDER — PROPOFOL 10 MG/ML IV BOLUS
INTRAVENOUS | Status: DC | PRN
  Administered 2021-10-01: 80 mg via INTRAVENOUS
  Administered 2021-10-01 (×4): 40 mg via INTRAVENOUS

## 2021-10-01 MED ORDER — LIDOCAINE HCL (PF) 2 % IJ SOLN
INTRAMUSCULAR | Status: AC
  Filled 2021-10-01: qty 5

## 2021-10-01 MED ORDER — SUCCINYLCHOLINE CHLORIDE 200 MG/10ML IV SOSY
PREFILLED_SYRINGE | INTRAVENOUS | Status: AC
  Filled 2021-10-01: qty 10

## 2021-10-01 MED ORDER — PROPOFOL 10 MG/ML IV BOLUS
INTRAVENOUS | Status: AC
  Filled 2021-10-01: qty 20

## 2021-10-01 MED ORDER — FENTANYL CITRATE (PF) 100 MCG/2ML IJ SOLN
INTRAMUSCULAR | Status: AC
  Filled 2021-10-01: qty 2

## 2021-10-01 MED ORDER — SODIUM CHLORIDE 0.9 % IV SOLN: INTRAVENOUS | Status: DC

## 2021-10-01 NOTE — Transfer of Care (Signed)
Immediate Anesthesia Transfer of Care Note  Patient: Meredith Wilcox  Procedure(s) Performed: ENDOSCOPIC RETROGRADE CHOLANGIOPANCREATOGRAPHY (ERCP)  Patient Location: Endoscopy Unit  Anesthesia Type:General  Level of Consciousness: drowsy  Airway & Oxygen Therapy: Patient Spontanous Breathing and Patient connected to nasal cannula oxygen  Post-op Assessment: Report given to RN, Post -op Vital signs reviewed and stable and Patient moving all extremities  Post vital signs: Reviewed and stable  Last Vitals:  Vitals Value Taken Time  BP 129/59 10/01/21 1133  Temp 36.4 C 10/01/21 1133  Pulse 66 10/01/21 1137  Resp 18 10/01/21 1133  SpO2 100 % 10/01/21 1137  Vitals shown include unvalidated device data.  Last Pain:  Vitals:   10/01/21 1133  TempSrc: Temporal  PainSc: Asleep         Complications: No notable events documented.

## 2021-10-01 NOTE — H&P (Signed)
Meredith Lame, MD [address] [place], [place] [address] Phone:[phone] Fax : [phone]  Primary Care Physician:  [name], MD Primary Gastroenterologist:  Dr. Allen Norris  Pre-Procedure History & Physical: HPI:  Meredith Wilcox is a 81 y.o. female is here for an ERCP.   Past Medical History:  Diagnosis Date   Anemia    H/O DURING PREGNANCY   Arthritis    Chronic kidney disease    KIDNEY PROBLEMS AROUND AGE 89   Geographic tongue    GERD (gastroesophageal reflux disease)    H/O NO MEDS   Helicobacter pylori gastrointestinal tract infection    Hyperlipidemia    Incontinence    Indigestion    Prolapse of vaginal walls    Right sided sciatica    Tachycardia    Urethral prolapse     Past Surgical History:  Procedure Laterality Date   BREAST BIOPSY Left 10/11/2016   left breast stereo/ VASCULAR LESION   BREAST BIOPSY Left 12/02/2016   Procedure: NEEDLE LOCALIZATION;  Surgeon: [name], MD;  Location: [facility] ORS;  Service: General;  Laterality: Left;   BREAST EXCISIONAL BIOPSY Left 12/02/2016   left lumpectomy   BREAST LUMPECTOMY Left 12/02/2016   Procedure: EXCISION BREAST MASS;  Surgeon: [name], MD;  Location: [facility] ORS;  Service: General;  Laterality: Left;   DILATION AND CURETTAGE OF UTERUS     dnc     1983   ERCP N/A 07/07/2021   Procedure: ENDOSCOPIC RETROGRADE CHOLANGIOPANCREATOGRAPHY (ERCP);  Surgeon: Meredith Lame, MD;  Location: [facility] ENDOSCOPY;  Service: Endoscopy;  Laterality: N/A;   fatty tumor removal Right    hand    Prior to Admission medications   Medication Sig Start Date End Date Taking? Authorizing Provider  amLODipine (NORVASC) 2.5 MG tablet Take 1 tablet (2.5 mg total) by mouth every evening. 08/13/21   [name], MD    Allergies as of 09/16/2021 - Review Complete 09/16/2021  Allergen Reaction Noted   Tussionex pennkinetic er Jerene Canny poli-chlorphe poli er] Other (See Comments) 07/10/2015    Hydrocodone  11/11/2015    Family History  Problem Relation Age of Onset   Diabetes Mother    Hypertension Mother    Cardiomyopathy Sister    Down syndrome Sister    Leukemia Brother    Alcohol abuse Son        youngest son   Asthma Son        youngest son   Rheum arthritis Sister    Drug abuse Child 63   Breast cancer Neg Hx    Kidney cancer Neg Hx    Bladder Cancer Neg Hx    Prostate cancer Neg Hx     Social History   Socioeconomic History   Marital status: Married    Spouse name: Meredith Wilcox   Number of children: 4   Years of education: some college   Highest education level: 12th grade  Occupational History   Occupation: Retired  Tobacco Use   Smoking status: Never   Smokeless tobacco: Never   Tobacco comments:    smoking cessation materials not required  Vaping Use   Vaping Use: Never used  Substance and Sexual Activity   Alcohol use: No    Alcohol/week: 0.0 standard drinks of alcohol   Drug use: No   Sexual activity: Not Currently    Birth control/protection: Post-menopausal  Other Topics Concern   Not on file  Social History Narrative   Not on file  Social Determinants of Health   Financial Resource Strain: Low Risk  (11/08/2019)   Overall Financial Resource Strain (CARDIA)    Difficulty of Paying Living Expenses: Not hard at all  Food Insecurity: No Food Insecurity (11/08/2019)   Hunger Vital Sign    Worried About Running Out of Food in the Last Year: Never true    Ran Out of Food in the Last Year: Never true  Transportation Needs: No Transportation Needs (11/08/2019)   PRAPARE - Hydrologist (Medical): No    Lack of Transportation (Non-Medical): No  Physical Activity: Sufficiently Active (11/08/2019)   Exercise Vital Sign    Days of Exercise per Week: 7 days    Minutes of Exercise per Session: 30 min  Stress: No Stress Concern Present (11/08/2019)   [place]    Feeling of Stress : Not at all  Social Connections: Moderately Integrated (11/08/2019)   Social Connection and Isolation Panel [NHANES]    Frequency of Communication with Friends and Family: More than three times a week    Frequency of Social Gatherings with Friends and Family: Twice a week    Attends Religious Services: More than 4 times per year    Active Member of [employer] or Organizations: No    Attends Archivist Meetings: Never    Marital Status: Married  Human resources officer Violence: Not At Risk (11/08/2019)   Humiliation, Afraid, Rape, and Kick questionnaire    Fear of Current or Ex-Partner: No    Emotionally Abused: No    Physically Abused: No    Sexually Abused: No    Review of Systems: See HPI, otherwise negative ROS  Physical Exam: BP (!) 156/69   Pulse 80   Temp (!) 97.1 F (36.2 C) (Temporal)   Resp 20   Ht '5\' 5"'$  (1.651 m)   Wt 62.6 kg   SpO2 100%   BMI 22.96 kg/m  General:   Alert,  pleasant and cooperative in NAD Head:  Normocephalic and atraumatic. Neck:  Supple; no masses or thyromegaly. Lungs:  Clear throughout to auscultation.    Heart:  Regular rate and rhythm. Abdomen:  Soft, nontender and nondistended. Normal bowel sounds, without guarding, and without rebound.   Neurologic:  Alert and  oriented x4;  grossly normal neurologically.  Impression/Plan: Lucianne Smestad is here for an ERCP to be performed for stent change  Risks, benefits, limitations, and alternatives regarding  ERCP have been reviewed with the patient.  Questions have been answered.  All parties agreeable.   Meredith Lame, MD  10/01/2021, 10:45 AM

## 2021-10-01 NOTE — Anesthesia Postprocedure Evaluation (Signed)
Anesthesia Post Note  Patient: Meredith Wilcox  Procedure(s) Performed: ENDOSCOPIC RETROGRADE CHOLANGIOPANCREATOGRAPHY (ERCP)  Patient location during evaluation: PACU Anesthesia Type: General Level of consciousness: awake and alert Pain management: pain level controlled Vital Signs Assessment: post-procedure vital signs reviewed and stable Respiratory status: spontaneous breathing, nonlabored ventilation, respiratory function stable and patient connected to nasal cannula oxygen Cardiovascular status: blood pressure returned to baseline and stable Postop Assessment: no apparent nausea or vomiting Anesthetic complications: no   No notable events documented.   Last Vitals:  Vitals:   10/01/21 1153 10/01/21 1203  BP: 137/73 (!) 142/66  Pulse: 71 68  Resp: 18 20  Temp:    SpO2: 100% 99%    Last Pain:  Vitals:   10/01/21 1203  TempSrc:   PainSc: 0-No pain                 Molli Barrows

## 2021-10-01 NOTE — Anesthesia Preprocedure Evaluation (Signed)
Anesthesia Evaluation  Patient identified by MRN, date of birth, ID band Patient awake    Reviewed: Allergy & Precautions, H&P , NPO status , Patient's Chart, lab work & pertinent test results, reviewed documented beta blocker date and time   Airway Mallampati: II   Neck ROM: full    Dental  (+) Poor Dentition   Pulmonary neg pulmonary ROS,    Pulmonary exam normal        Cardiovascular negative cardio ROS Normal cardiovascular exam Rhythm:regular Rate:Normal     Neuro/Psych  Neuromuscular disease negative psych ROS   GI/Hepatic Neg liver ROS, GERD  Medicated,  Endo/Other  negative endocrine ROS  Renal/GU CRFRenal disease  negative genitourinary   Musculoskeletal   Abdominal   Peds  Hematology  (+) Blood dyscrasia, anemia ,   Anesthesia Other Findings Past Medical History: No date: Anemia     Comment:  H/O DURING PREGNANCY No date: Arthritis No date: Chronic kidney disease     Comment:  KIDNEY PROBLEMS AROUND AGE 41 No date: Geographic tongue No date: GERD (gastroesophageal reflux disease)     Comment:  H/O NO MEDS No date: Helicobacter pylori gastrointestinal tract infection No date: Hyperlipidemia No date: Incontinence No date: Indigestion No date: Prolapse of vaginal walls No date: Right sided sciatica No date: Tachycardia No date: Urethral prolapse Past Surgical History: 10/11/2016: BREAST BIOPSY; Left     Comment:  left breast stereo/ VASCULAR LESION 12/02/2016: BREAST BIOPSY; Left     Comment:  Procedure: NEEDLE LOCALIZATION;  Surgeon: [name], MD;  Location: [facility] ORS;  Service:               General;  Laterality: Left; 12/02/2016: BREAST EXCISIONAL BIOPSY; Left     Comment:  left lumpectomy 12/02/2016: BREAST LUMPECTOMY; Left     Comment:  Procedure: EXCISION BREAST MASS;  Surgeon: [name], MD;  Location: [facility] ORS;  Service:                General;  Laterality: Left; No date: DILATION AND CURETTAGE OF UTERUS No date: dnc     Comment:  1983 07/07/2021: ERCP; N/A     Comment:  Procedure: ENDOSCOPIC RETROGRADE               CHOLANGIOPANCREATOGRAPHY (ERCP);  Surgeon: [name],               MD;  Location: [facility] ENDOSCOPY;  Service: Endoscopy;                Laterality: N/A; No date: fatty tumor removal; Right     Comment:  hand BMI    Body Mass Index: 22.96 kg/m     Reproductive/Obstetrics negative OB ROS                             Anesthesia Physical Anesthesia Plan  ASA: 2  Anesthesia Plan: General   Post-op Pain Management:    Induction:   PONV Risk Score and Plan:   Airway Management Planned:   Additional Equipment:   Intra-op Plan:   Post-operative Plan:   Informed Consent: I have reviewed the patients History and Physical, chart, labs and discussed the procedure including the risks, benefits and alternatives for the proposed anesthesia with the patient or authorized representative  who has indicated his/her understanding and acceptance.     Dental Advisory Given  Plan Discussed with: CRNA  Anesthesia Plan Comments:         Anesthesia Quick Evaluation

## 2021-10-01 NOTE — Op Note (Signed)
[facility] Patient Name: Meredith Wilcox Procedure Date: 10/01/2021 10:45 AM MRN: [id number] Account #: [id number] Date of Birth: 1940/05/27 Admit Type: Outpatient Age: 81 Room: [facility] ENDO ROOM 4 Gender: Female Note Status: Finalized Instrument Name: Selinda Eon [id number] Procedure:             ERCP Indications:           Tumor of the head of pancreas Providers:             [name] MD, MD Referring MD:          Bethena Roys. Sowles, MD (Referring MD) Medicines:             Propofol per Anesthesia Complications:         No immediate complications. Procedure:             Pre-Anesthesia Assessment:                        - Prior to the procedure, a History and Physical was                         performed, and patient medications and allergies were                         reviewed. The patient's tolerance of previous                         anesthesia was also reviewed. The risks and benefits                         of the procedure and the sedation options and risks                         were discussed with the patient. All questions were                         answered, and informed consent was obtained. Prior                         Anticoagulants: The patient has taken no previous                         anticoagulant or antiplatelet agents. ASA Grade                         Assessment: II - A patient with mild systemic disease.                         After reviewing the risks and benefits, the patient                         was deemed in satisfactory condition to undergo the                         procedure.                        After obtaining informed consent, the scope was passed  under direct vision. Throughout the procedure, the                         patient's blood pressure, pulse, and oxygen                         saturations were monitored continuously. The                         Duodenoscope was introduced  through the mouth, and                         used to inject contrast into and used to inject                         contrast into the bile duct. The ERCP was accomplished                         without difficulty. The patient tolerated the                         procedure well. Findings:      A scout film of the abdomen was obtained. One stent ending in the main       bile duct was seen. One stent was removed from the biliary tree using a       snare. A wire was passed into the biliary tree. One 10 Fr by 9 cm       plastic stent was placed 7 cm into the common bile duct. Bile flowed       through the stent. The stent was in good position. Impression:            - One stent was removed from the biliary tree.                        - One plastic stent was placed into the common bile                         duct. Recommendation:        - Discharge patient to home.                        - Resume previous diet.                        - Repeat ERCP in 3 months to exchange stent. Procedure Code(s):     --- Professional ---                        [id number], Endoscopic retrograde cholangiopancreatography                         (ERCP); with removal and exchange of stent(s), biliary                         or pancreatic duct, including pre- and post-dilation                         and guide wire passage, when performed, including  sphincterotomy, when performed, each stent exchanged Diagnosis Code(s):     --- Professional ---                        D49.0, Neoplasm of unspecified behavior of digestive                         system                        Z46.59, Encounter for fitting and adjustment of other                         gastrointestinal appliance and device CPT copyright 2019 American Medical Association. All rights reserved. The codes documented in this report are preliminary and upon coder review may  be revised to meet current compliance  requirements. [name] MD, MD 10/01/2021 11:34:11 AM This report has been signed electronically. Number of Addenda: 0 Note Initiated On: 10/01/2021 10:45 AM Estimated Blood Loss:  Estimated blood loss: none.      [facility]

## 2021-10-02 ENCOUNTER — Encounter: Payer: Self-pay | Admitting: Gastroenterology

## 2021-11-13 NOTE — Progress Notes (Signed)
Name: Meredith Wilcox   MRN: [id number]    DOB: 1941/04/01   Date:11/16/2021       Progress Note  Subjective  Chief Complaint  Follow Up  HPI  Pancreatitis mass: she was admitted to [facility] on 07/07/2021, she had painless jaundice and acholic stools with dark urine ( that was the reason for her visit with Delsa Grana 03/27), she was diagnosed with a mass, elevated CA19-9, elevated LFT's, she had stent placed and symptoms improved, LFT's and bilirubin still high. She had another stent placed on 10/01/2021. She continues to feel bloated, lost another 5 lbs since last visit CA19.9 is 92 . She denies any pain. Discussed eating smaller portions more frequently to see if bloating will improve   Malnutrition: she has lost 27  lbs since Feb 2023, discussed high calorie diet, she states appetite is good, she is trying to eat more protein.    Cystocele : does not like pessary, seen by Urologist last visit 05/2019 she is using estrogen cream three times a week,, but stopped due to pancreatic mass, she saw  Dr. Matilde Sprang discussed hysterectomy and cystocele repair. She states she is not ready for surgery at this time. She still has urinary urgency and frequency. Unchanged    History of DVT: Spring of 2019, resolved on repeat doppler US 10/2017. No problems since. She denies leg pain, swelling or SOB. Unchanged   Left  knee OA: symptoms are mild intermittent, not taking medication, no swelling, redness and is not affecting her level of activity . Feeling well   She continues to refuse vaccinations, mammograms or bone density . 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 11/16/2014   Incontinence 11/16/2014   Prolapse of urethra 11/16/2014    Past Surgical History:  Procedure Laterality Date   BREAST BIOPSY Left 10/11/2016   left breast stereo/ VASCULAR LESION   BREAST BIOPSY Left 12/02/2016   Procedure: NEEDLE LOCALIZATION;  Surgeon: [name], MD;  Location: [facility] ORS;  Service: General;  Laterality: Left;   BREAST EXCISIONAL BIOPSY Left 12/02/2016   left lumpectomy   BREAST LUMPECTOMY Left 12/02/2016   Procedure: EXCISION BREAST MASS;  Surgeon: [name], MD;  Location: [facility] ORS;  Service: General;  Laterality: Left;   DILATION AND CURETTAGE OF UTERUS     dnc     1983   ERCP N/A 07/07/2021   Procedure: ENDOSCOPIC RETROGRADE CHOLANGIOPANCREATOGRAPHY (ERCP);  Surgeon: [name], MD;  Location: [facility] ENDOSCOPY;  Service: Endoscopy;  Laterality: N/A;   ERCP N/A 10/01/2021   Procedure: ENDOSCOPIC RETROGRADE CHOLANGIOPANCREATOGRAPHY (ERCP);  Surgeon: [name], MD;  Location: [facility] ENDOSCOPY;  Service: Endoscopy;  Laterality: N/A;   fatty tumor removal Right    hand    Family History  Problem Relation Age of Onset   Diabetes Mother    Hypertension Mother    Cardiomyopathy Sister    Down syndrome Sister    Leukemia Brother    Alcohol abuse Son  youngest son   Asthma Son        youngest son   Rheum arthritis Sister    Drug abuse Child 85   Breast cancer Neg Hx    Kidney cancer Neg Hx    Bladder Cancer Neg Hx    Prostate cancer Neg Hx     Social History   Tobacco Use   Smoking status: Never   Smokeless tobacco: Never   Tobacco comments:    smoking cessation materials not required  Substance Use Topics   Alcohol use: No    Alcohol/week: 0.0 standard drinks of alcohol     Current  Outpatient Medications:    amLODipine (NORVASC) 2.5 MG tablet, Take 1 tablet (2.5 mg total) by mouth every evening., Disp: 90 tablet, Rfl: 0  Allergies  Allergen Reactions   Tussionex Pennkinetic Er [Hydrocod Poli-Chlorphe Poli Er] Other (See Comments)    Real dizzy headed even after a while still walking every direction but straight, patient stated she stopped taking it because of that.   Hydrocodone     Light headed and dizziness    I personally reviewed active problem list, medication list, allergies, family history, social history, health maintenance with the patient/caregiver today.   ROS  Ten systems reviewed and is negative except as mentioned in HPI   Objective  Vitals:   11/16/21 0944  BP: 136/70  Pulse: 78  Resp: 14  Temp: 98.1 F (36.7 C)  TempSrc: Oral  SpO2: 99%  Weight: 133 lb 1.6 oz (60.4 kg)  Height: '5\' 5"'$  (1.651 m)    Body mass index is 22.15 kg/m.  Physical Exam  Constitutional: Patient appears well-developed and malnourished  No distress.  HEENT: head atraumatic, normocephalic, pupils equal and reactive to light,, neck supple, Cardiovascular: Normal rate, regular rhythm and normal heart sounds.  No murmur heard. No BLE edema. Pulmonary/Chest: Effort normal and breath sounds normal. No respiratory distress. Abdominal: Soft.  There is mild epigastric and LUQ  tenderness with some voluntary guarding but no rebound tenderness . Psychiatric: Patient has a normal mood and affect. behavior is normal. Judgment and thought content normal.   Recent Results (from the past 2160 hour(s))  I-STAT creatinine     Status: None   Collection Time: 09/14/21  9:25 AM  Result Value Ref Range   Creatinine, Ser 0.70 0.44 - 1.00 mg/dL  Comprehensive metabolic panel     Status: Abnormal   Collection Time: 09/16/21 10:04 AM  Result Value Ref Range   Sodium 135 135 - 145 mmol/L   Potassium 3.7 3.5 - 5.1 mmol/L   Chloride 98 98 - 111 mmol/L   CO2 30 22 - 32 mmol/L    Glucose, Bld 97 70 - 99 mg/dL    Comment: Glucose reference range applies only to samples taken after fasting for at least 8 hours.   BUN 11 8 - 23 mg/dL   Creatinine, Ser 0.85 0.44 - 1.00 mg/dL   Calcium 9.1 8.9 - 10.3 mg/dL   Total Protein 8.3 (H) 6.5 - 8.1 g/dL   Albumin 3.8 3.5 - 5.0 g/dL   AST 116 (H) 15 - 41 U/L   ALT 53 (H) 0 - 44 U/L   Alkaline Phosphatase 272 (H) 38 - 126 U/L   Total Bilirubin 0.8 0.3 - 1.2 mg/dL   GFR, Estimated >60 >60 mL/min    Comment: (NOTE) Calculated using the CKD-EPI Creatinine Equation (2021)    Anion gap 7 5 - 15  Comment: Performed at [facility], [address] [place], [place] [address]  CBC with Differential/Platelet     Status: Abnormal   Collection Time: 09/16/21 10:04 AM  Result Value Ref Range   WBC 3.2 (L) 4.0 - 10.5 K/uL   RBC 4.18 3.87 - 5.11 MIL/uL   Hemoglobin 12.2 12.0 - 15.0 g/dL   HCT 37.4 36.0 - 46.0 %   MCV 89.5 80.0 - 100.0 fL   MCH 29.2 26.0 - 34.0 pg   MCHC 32.6 30.0 - 36.0 g/dL   RDW 13.9 11.5 - 15.5 %   Platelets 203 150 - 400 K/uL   nRBC 0.0 0.0 - 0.2 %   Neutrophils Relative % 45 %   Neutro Abs 1.5 (L) 1.7 - 7.7 K/uL   Lymphocytes Relative 39 %   Lymphs Abs 1.3 0.7 - 4.0 K/uL   Monocytes Relative 12 %   Monocytes Absolute 0.4 0.1 - 1.0 K/uL   Eosinophils Relative 3 %   Eosinophils Absolute 0.1 0.0 - 0.5 K/uL   Basophils Relative 1 %   Basophils Absolute 0.0 0.0 - 0.1 K/uL   Immature Granulocytes 0 %   Abs Immature Granulocytes 0.01 0.00 - 0.07 K/uL    Comment: Performed at [facility], [address] [place], [place] [address]  Cancer antigen 19-9     Status: Abnormal   Collection Time: 09/16/21 10:04 AM  Result Value Ref Range   CA 19-9 92 (H) 0 - 35 U/mL    Comment: (NOTE) Roche Diagnostics Electrochemiluminescence Immunoassay (ECLIA) Values obtained with different assay methods or kits cannot be used interchangeably.  Results cannot be interpreted as absolute evidence of the presence  or absence of malignant disease. Performed At: [facility] [place], [place] [id number] Rush Farmer MD CV:[id number]     PHQ2/9:    11/16/2021    9:57 AM 08/13/2021    9:58 AM 07/06/2021    8:57 AM 05/18/2021    8:30 AM 08/13/2020   11:02 AM  Depression screen PHQ 2/9  Decreased Interest 0 0 0 0 0  Down, Depressed, Hopeless 0 0 0 0 0  PHQ - 2 Score 0 0 0 0 0  Altered sleeping 0 0 0 0   Tired, decreased energy 0 0 0 0   Change in appetite 0 0 0 0   Feeling bad or failure about yourself  0 0 0 0   Trouble concentrating 0 0 0 0   Moving slowly or fidgety/restless 0 0 0 0   Suicidal thoughts 0 0 0 0   PHQ-9 Score 0 0 0 0   Difficult doing work/chores   Not difficult at all      phq 9 is negative   Fall Risk:    11/16/2021    9:57 AM 08/13/2021    9:58 AM 07/06/2021    8:57 AM 05/18/2021    8:30 AM 08/13/2020   11:02 AM  Fall Risk   Falls in the past year? 0 0 0 0 0  Number falls in past yr:   0 0 0  Injury with Fall?   0 0 0  Risk for fall due to : No Fall Risks No Fall Risks No Fall Risks No Fall Risks   Follow up Falls prevention discussed Falls prevention discussed Falls prevention discussed Falls prevention discussed       Functional Status Survey: Is the patient deaf or have difficulty hearing?: No Does the patient have  difficulty seeing, even when wearing glasses/contacts?: Yes Does the patient have difficulty concentrating, remembering, or making decisions?: No Does the patient have difficulty walking or climbing stairs?: No Does the patient have difficulty dressing or bathing?: No Does the patient have difficulty doing errands alone such as visiting a doctor's office or shopping?: No    Assessment & Plan  1. Pancreatic mass  Keep follow ups with Dr. Durwin Reges and Grayland Ormond, advised to try gas-x for the bloating.   2. Other neutropenia ([place])  Monitoring   3. Mild protein-calorie malnutrition ([place])  Discussed smaller meals more frequently    4. Primary osteoarthritis of left knee   stable

## 2021-11-16 ENCOUNTER — Ambulatory Visit (INDEPENDENT_AMBULATORY_CARE_PROVIDER_SITE_OTHER): Payer: Medicare Other | Admitting: Family Medicine

## 2021-11-16 ENCOUNTER — Encounter: Payer: Self-pay | Admitting: Family Medicine

## 2021-11-16 VITALS — BP 136/70 | HR 78 | Temp 98.1°F | Resp 14 | Ht 65.0 in | Wt 133.1 lb

## 2021-11-16 DIAGNOSIS — D708 Other neutropenia: Secondary | ICD-10-CM | POA: Diagnosis not present

## 2021-11-16 DIAGNOSIS — K8689 Other specified diseases of pancreas: Secondary | ICD-10-CM | POA: Diagnosis not present

## 2021-11-16 DIAGNOSIS — M1712 Unilateral primary osteoarthritis, left knee: Secondary | ICD-10-CM | POA: Insufficient documentation

## 2021-11-16 DIAGNOSIS — E46 Unspecified protein-calorie malnutrition: Secondary | ICD-10-CM | POA: Insufficient documentation

## 2021-11-16 DIAGNOSIS — E441 Mild protein-calorie malnutrition: Secondary | ICD-10-CM | POA: Diagnosis not present

## 2021-11-16 NOTE — Patient Instructions (Signed)
Meals on Wheels [phone]

## 2021-11-18 ENCOUNTER — Telehealth: Payer: Self-pay

## 2021-11-18 NOTE — Telephone Encounter (Signed)
Spoke with Harle Stanford and gave referral per Dr. Ancil Boozer for patient to be accessed for Meals on Wheels program.

## 2021-12-31 ENCOUNTER — Ambulatory Visit (INDEPENDENT_AMBULATORY_CARE_PROVIDER_SITE_OTHER): Payer: Medicare Other

## 2021-12-31 DIAGNOSIS — Z0000 Encounter for general adult medical examination without abnormal findings: Secondary | ICD-10-CM

## 2021-12-31 NOTE — Progress Notes (Signed)
I connected with  Lois Slagel on 12/31/21 by a audio enabled telemedicine application and verified that I am speaking with the correct person using two identifiers.  Patient Location: Home  Provider Location: Office/Clinic  I discussed the limitations of evaluation and management by telemedicine. The patient expressed understanding and agreed to proceed.   Subjective:   Meredith Wilcox is a 81 y.o. female who presents for Medicare Annual (Subsequent) preventive examination.  Review of Systems    Per HPI unless specifically indicated below.  Cardiac Risk Factors include: advanced age (>28mn, >>29women);female gender        Objective:    Today's Vitals   12/31/21 1547  PainSc: 0-No pain   There is no height or weight on file to calculate BMI.     10/01/2021    9:41 AM 09/16/2021   10:55 AM 07/22/2021   11:15 AM 07/07/2021    7:59 AM 07/06/2021    8:53 PM 11/08/2019   10:17 AM 11/07/2018    9:25 AM  Advanced Directives  Does Patient Have a Medical Advance Directive? No No No  No No No  Would patient like information on creating a medical advance directive? No - Patient declined No - Patient declined No - Patient declined No - Patient declined  Yes (MAU/Ambulatory/Procedural Areas - Information given) Yes (MAU/Ambulatory/Procedural Areas - Information given)    Current Medications (verified) Outpatient Encounter Medications as of 12/31/2021  Medication Sig   amLODipine (NORVASC) 2.5 MG tablet Take 1 tablet (2.5 mg total) by mouth every evening.   No facility-administered encounter medications on file as of 12/31/2021.    Allergies (verified) Tussionex pennkinetic er [hydrocod poli-chlorphe poli er] and Hydrocodone   History: Past Medical History:  Diagnosis Date   Anemia    H/O DURING PREGNANCY   Arthritis    Chronic kidney disease    KIDNEY PROBLEMS AROUND AGE 30   Geographic tongue    GERD (gastroesophageal reflux disease)    H/O NO MEDS   Helicobacter pylori  gastrointestinal tract infection    Hyperlipidemia    Incontinence    Indigestion    Prolapse of vaginal walls    Right sided sciatica    Tachycardia    Urethral prolapse    Past Surgical History:  Procedure Laterality Date   BREAST BIOPSY Left 10/11/2016   left breast stereo/ VASCULAR LESION   BREAST BIOPSY Left 12/02/2016   Procedure: NEEDLE LOCALIZATION;  Surgeon: SChristene Lye MD;  Location: [facility] ORS;  Service: General;  Laterality: Left;   BREAST EXCISIONAL BIOPSY Left 12/02/2016   left lumpectomy   BREAST LUMPECTOMY Left 12/02/2016   Procedure: EXCISION BREAST MASS;  Surgeon: SChristene Lye MD;  Location: [facility] ORS;  Service: General;  Laterality: Left;   DILATION AND CURETTAGE OF UTERUS     dnc     1983   ERCP N/A 07/07/2021   Procedure: ENDOSCOPIC RETROGRADE CHOLANGIOPANCREATOGRAPHY (ERCP);  Surgeon: WLucilla Lame MD;  [facility];  Service: Endoscopy;  Laterality: N/A;   ERCP N/A 10/01/2021   Procedure: ENDOSCOPIC RETROGRADE CHOLANGIOPANCREATOGRAPHY (ERCP);  Surgeon: WLucilla Lame MD;  [facility];  Service: Endoscopy;  Laterality: N/A;   fatty tumor removal Right    hand   Family History  Problem Relation Age of Onset   Diabetes Mother    Hypertension Mother    Cardiomyopathy Sister    Down syndrome Sister    Leukemia Brother    Alcohol abuse Son  youngest son   Asthma Son        youngest son   Rheum arthritis Sister    Drug abuse Child 18   Breast cancer Neg Hx    Kidney cancer Neg Hx    Bladder Cancer Neg Hx    Prostate cancer Neg Hx    Social History   Socioeconomic History   Marital status: Married    Spouse name: [name]   Number of children: 4   Years of education: some college   Highest education level: 12th grade  Occupational History   Occupation: Retired  Tobacco Use   Smoking status: Never   Smokeless tobacco: Never   Tobacco comments:    smoking cessation materials not required  Vaping  Use   Vaping Use: Never used  Substance and Sexual Activity   Alcohol use: No    Alcohol/week: 0.0 standard drinks of alcohol   Drug use: No   Sexual activity: Not Currently    Birth control/protection: Post-menopausal  Other Topics Concern   Not on file  Social History Narrative   Not on file   Social Determinants of Health   Financial Resource Strain: Low Risk  (12/31/2021)   Overall Financial Resource Strain (CARDIA)    Difficulty of Paying Living Expenses: Not hard at all  Food Insecurity: No Food Insecurity (12/31/2021)   Hunger Vital Sign    Worried About Running Out of Food in the Last Year: Never true    [place] in the Last Year: Never true  Transportation Needs: No Transportation Needs (12/31/2021)   PRAPARE - Hydrologist (Medical): No    Lack of Transportation (Non-Medical): No  Physical Activity: Sufficiently Active (12/31/2021)   Exercise Vital Sign    Days of Exercise per Week: 7 days    Minutes of Exercise per Session: 30 min  Stress: No Stress Concern Present (12/31/2021)   [place]    Feeling of Stress : Not at all  Social Connections: [place] (12/31/2021)   Social Connection and Isolation Panel [NHANES]    Frequency of Communication with Friends and Family: More than three times a week    Frequency of Social Gatherings with Friends and Family: More than three times a week    Attends Religious Services: More than 4 times per year    Active Member of [employer] or Organizations: Yes    Attends Music therapist: More than 4 times per year    Marital Status: Married    Tobacco Counseling Counseling given: No Tobacco comments: smoking cessation materials not required   Clinical Intake:  Pre-visit preparation completed: No  Pain : No/denies pain Pain Score: 0-No pain     Nutritional Status: BMI of 19-24  Normal Nutritional Risks:  None Diabetes: No  How often do you need to have someone help you when you read instructions, pamphlets, or other written materials from your doctor or pharmacy?: 1 - Never  Diabetic?No Interpreter Needed?: No  Information entered by :: Donnie Mesa, CMA   Activities of Daily Living    12/31/2021    3:39 PM 11/16/2021    9:57 AM  In your present state of health, do you have any difficulty performing the following activities:  Hearing? 0 0  Vision? 1 1  Difficulty concentrating or making decisions? 0 0  Walking or climbing stairs? 1 0  Dressing or bathing? 0 0  Doing errands,  shopping? 0 0    Patient Care Team: [name], MD as PCP - General (Family Medicine) Birder Robson, MD as Consulting Physician (Ophthalmology)  Indicate any recent Medical Services you may have received from other than [facility] providers in the past year (date may be approximate).    The pt was hospitalized on 10/01/21 for Pancreatic Mass.  Assessment:   This is a routine wellness examination for [facility].  Hearing/Vision screen Denies any hearing issues. Overdue for annual Eye Exam, admits to blurry vision.  Dietary issues and exercise activities discussed: Current Exercise Habits: Structured exercise class, Type of exercise: walking, Time (Minutes): 20, Frequency (Times/Week): 7, Weekly Exercise (Minutes/Week): 140, Intensity: Moderate, Exercise limited by: cardiac condition(s)   Goals Addressed   None   Depression Screen    11/16/2021    9:57 AM 08/13/2021    9:58 AM 07/06/2021    8:57 AM 05/18/2021    8:30 AM 08/13/2020   11:02 AM 11/08/2019   10:16 AM 07/11/2019   10:01 AM  PHQ 2/9 Scores  PHQ - 2 Score 0 0 0 0 0 0 0  PHQ- 9 Score 0 0 0 0   0    Fall Risk    11/16/2021    9:57 AM 08/13/2021    9:58 AM 07/06/2021    8:57 AM 05/18/2021    8:30 AM 08/13/2020   11:02 AM  [place] in the past year? 0 0 0 0 0  Number falls in past yr:   0 0 0  Injury with Fall?   0 0 0  Risk for fall due  to : No Fall Risks No Fall Risks No Fall Risks No Fall Risks   Follow up Falls prevention discussed Falls prevention discussed Falls prevention discussed Falls prevention discussed     FALL RISK PREVENTION PERTAINING TO THE HOME:  Any stairs in or around the home? Yes  If so, are there any without handrails? No  Home free of loose throw rugs in walkways, pet beds, electrical cords, etc? Yes  Adequate lighting in your home to reduce risk of falls? Yes   ASSISTIVE DEVICES UTILIZED TO PREVENT FALLS:  Life alert? Yes  Use of a cane, walker or w/c? No  Grab bars in the bathroom? No  Shower chair or bench in shower? No  Elevated toilet seat or a handicapped toilet? No   TIMED UP AND GO:  Was the test performed?  unable to perform, because it is virtual appt  .     Cognitive Function:        12/31/2021    3:43 PM 11/08/2019   10:18 AM 11/07/2018    9:32 AM 11/01/2017    9:33 AM  6CIT Screen  What Year? 0 points 0 points 0 points 0 points  What month? 0 points 0 points 0 points 0 points  What time? 0 points 0 points 0 points 3 points  Count back from 20 0 points 0 points 0 points 0 points  Months in reverse 0 points 0 points 0 points 0 points  Repeat phrase 0 points 0 points 2 points 4 points  Total Score 0 points 0 points 2 points 7 points    Immunizations Immunization History  Administered Date(s) Administered   Tdap 01/24/2019    TDAP status: Up to date  Flu Vaccine status: Declined, Education has been provided regarding the importance of this vaccine but patient still declined. Advised may receive this vaccine at local  pharmacy or Health Dept. Aware to provide a copy of the vaccination record if obtained from local pharmacy or Health Dept. Verbalized acceptance and understanding.  Pneumococcal vaccine status: Declined,  Education has been provided regarding the importance of this vaccine but patient still declined. Advised may receive this vaccine at local pharmacy or  Health Dept. Aware to provide a copy of the vaccination record if obtained from local pharmacy or Health Dept. Verbalized acceptance and understanding.   Covid-19 vaccine status: Declined, Education has been provided regarding the importance of this vaccine but patient still declined. Advised may receive this vaccine at local pharmacy or Health Dept.or vaccine clinic. Aware to provide a copy of the vaccination record if obtained from local pharmacy or Health Dept. Verbalized acceptance and understanding.  Qualifies for Shingles Vaccine? Yes   Zostavax completed No   Shingrix Completed?: No.    Education has been provided regarding the importance of this vaccine. Patient has been advised to call insurance company to determine out of pocket expense if they have not yet received this vaccine. Advised may also receive vaccine at local pharmacy or Health Dept. Verbalized acceptance and understanding.  Screening Tests Health Maintenance  Topic Date Due   Zoster Vaccines- Shingrix (1 of 2) 02/13/2022 (Originally 01/16/1960)   Pneumonia Vaccine 46+ Years old (1 - PCV) 08/14/2022 (Originally 01/17/2007)   MAMMOGRAM  11/14/2022 (Originally 09/01/2017)   DEXA SCAN  11/14/2022 (Originally 01/17/2007)   Hepatitis C Screening  11/17/2022 (Originally 01/17/1959)   INFLUENZA VACCINE  03/02/2029 (Originally 11/10/2021)   TETANUS/TDAP  01/23/2029   HPV VACCINES  Aged Out   COVID-19 Vaccine  Discontinued    Health Maintenance  There are no preventive care reminders to display for this patient.  Colorectal cancer screening: No longer required.   Mammogram status: No longer required due to age.  DEXA Scan: declined   Lung Cancer Screening: (Low Dose CT Chest recommended if Age 29-80 years, 30 pack-year currently smoking OR have quit w/in 15years.) does not qualify.     Additional Screening:  Hepatitis C Screening: does not qualify Vision Screening: Recommended annual ophthalmology exams for early  detection of glaucoma and other disorders of the eye. Is the patient up to date with their annual eye exam?  No  Who is the provider or what is the name of the office in which the patient attends annual eye exams? [facility]  If pt is not established with a provider, would they like to be referred to a provider to establish care? No .   Dental Screening: Recommended annual dental exams for proper oral hygiene  Community Resource Referral / Chronic Care Management: CRR required this visit?  No   CCM required this visit?  No      Plan:     I have personally reviewed and noted the following in the patient's chart:   Medical and social history Use of alcohol, tobacco or illicit drugs  Current medications and supplements including opioid prescriptions. Patient is not currently taking opioid prescriptions. Functional ability and status Nutritional status Physical activity Advanced directives List of other physicians Hospitalizations, surgeries, and ER visits in previous 12 months Vitals Screenings to include cognitive, depression, and falls Referrals and appointments  In addition, I have reviewed and discussed with patient certain preventive protocols, quality metrics, and best practice recommendations. A written personalized care plan for preventive services as well as general preventive health recommendations were provided to patient.    Ms. Balzarini , Thank you  for taking time to come for your Medicare Wellness Visit. I appreciate your ongoing commitment to your health goals. Please review the following plan we discussed and let me know if I can assist you in the future.   These are the goals we discussed:  Goals      DIET - INCREASE WATER INTAKE     Recommend to drink at least 6-8 8oz glasses of water per day.        This is a list of the screening recommended for you and due dates:  Health Maintenance  Topic Date Due   Zoster (Shingles) Vaccine (1 of 2)  02/13/2022*   Pneumonia Vaccine (1 - PCV) 08/14/2022*   Mammogram  11/14/2022*   DEXA scan (bone density measurement)  11/14/2022*   Hepatitis C Screening: USPSTF Recommendation to screen - Ages 18-79 yo.  11/17/2022*   Flu Shot  03/02/2029*   Tetanus Vaccine  01/23/2029   HPV Vaccine  Aged Out   COVID-19 Vaccine  Discontinued  *Topic was postponed. The date shown is not the original due date.     Wilson Singer, [place]   12/31/2021   Nurse Notes: Approximately 30 minute Non-Face-to-Face

## 2022-01-14 ENCOUNTER — Ambulatory Visit: Payer: Medicare Other | Admitting: Oncology

## 2022-01-14 ENCOUNTER — Other Ambulatory Visit: Payer: Medicare Other

## 2022-01-15 ENCOUNTER — Telehealth: Payer: Self-pay | Admitting: Oncology

## 2022-01-15 ENCOUNTER — Ambulatory Visit: Payer: Medicare Other

## 2022-01-15 ENCOUNTER — Inpatient Hospital Stay: Payer: Medicare Other

## 2022-01-15 NOTE — Telephone Encounter (Signed)
Pt CT on Monday got moved to [place]. She wants to know if she can

## 2022-01-15 NOTE — Progress Notes (Signed)
  [place]  [phone] [phone]  ID: Nyiah Pianka OB: 08/31/1940  MR#: [id number]  XUJ#:[id number]  Patient Care Team: [name], MD as PCP - General (Family Medicine) Birder Robson, MD as Consulting Physician (Ophthalmology)   Lloyd Huger, MD   01/15/2022 7:19 AM    This encounter was created in error - please disregard.

## 2022-01-15 NOTE — Telephone Encounter (Signed)
ERROR

## 2022-01-18 ENCOUNTER — Inpatient Hospital Stay: Payer: Medicare Other

## 2022-01-18 ENCOUNTER — Ambulatory Visit
Admission: RE | Admit: 2022-01-18 | Discharge: 2022-01-18 | Disposition: A | Discharge status: Home / Self Care | Payer: Medicare Other | Source: Ambulatory Visit | Attending: Oncology | Admitting: Oncology

## 2022-01-18 DIAGNOSIS — K869 Disease of pancreas, unspecified: Secondary | ICD-10-CM | POA: Diagnosis not present

## 2022-01-18 DIAGNOSIS — K8689 Other specified diseases of pancreas: Secondary | ICD-10-CM

## 2022-01-18 DIAGNOSIS — M47816 Spondylosis without myelopathy or radiculopathy, lumbar region: Secondary | ICD-10-CM | POA: Diagnosis not present

## 2022-01-18 DIAGNOSIS — R590 Localized enlarged lymph nodes: Secondary | ICD-10-CM | POA: Diagnosis not present

## 2022-01-18 DIAGNOSIS — K8020 Calculus of gallbladder without cholecystitis without obstruction: Secondary | ICD-10-CM | POA: Diagnosis not present

## 2022-01-18 MED ORDER — IOHEXOL 300 MG/ML  SOLN
85.0000 mL | Freq: Once | INTRAMUSCULAR | Status: AC | PRN
  Administered 2022-01-18: 85 mL via INTRAVENOUS

## 2022-01-19 ENCOUNTER — Encounter: Payer: Self-pay | Admitting: Oncology

## 2022-01-19 ENCOUNTER — Inpatient Hospital Stay: Payer: Medicare Other | Admitting: Oncology

## 2022-01-19 ENCOUNTER — Telehealth: Payer: Medicare Other | Admitting: Oncology

## 2022-01-19 ENCOUNTER — Inpatient Hospital Stay: Payer: Medicare Other | Attending: Oncology

## 2022-01-19 VITALS — BP 146/69 | HR 79 | Temp 97.4°F | Resp 16 | Ht 65.0 in | Wt 128.0 lb

## 2022-01-19 DIAGNOSIS — K8689 Other specified diseases of pancreas: Secondary | ICD-10-CM

## 2022-01-19 DIAGNOSIS — R978 Other abnormal tumor markers: Secondary | ICD-10-CM | POA: Insufficient documentation

## 2022-01-19 DIAGNOSIS — D72819 Decreased white blood cell count, unspecified: Secondary | ICD-10-CM | POA: Insufficient documentation

## 2022-01-19 DIAGNOSIS — R7401 Elevation of levels of liver transaminase levels: Secondary | ICD-10-CM | POA: Insufficient documentation

## 2022-01-19 DIAGNOSIS — K869 Disease of pancreas, unspecified: Secondary | ICD-10-CM | POA: Insufficient documentation

## 2022-01-19 DIAGNOSIS — R634 Abnormal weight loss: Secondary | ICD-10-CM | POA: Diagnosis not present

## 2022-01-19 LAB — CBC WITH DIFFERENTIAL/PLATELET
Abs Immature Granulocytes: 0 10*3/uL (ref 0.00–0.07)
Basophils Absolute: 0 10*3/uL (ref 0.0–0.1)
Basophils Relative: 0 %
Eosinophils Absolute: 0 10*3/uL (ref 0.0–0.5)
Eosinophils Relative: 1 %
HCT: 38.6 % (ref 36.0–46.0)
Hemoglobin: 12.6 g/dL (ref 12.0–15.0)
Immature Granulocytes: 0 %
Lymphocytes Relative: 42 %
Lymphs Abs: 1.4 10*3/uL (ref 0.7–4.0)
MCH: 29 pg (ref 26.0–34.0)
MCHC: 32.6 g/dL (ref 30.0–36.0)
MCV: 88.9 fL (ref 80.0–100.0)
Monocytes Absolute: 0.4 10*3/uL (ref 0.1–1.0)
Monocytes Relative: 11 %
Neutro Abs: 1.5 10*3/uL — ABNORMAL LOW (ref 1.7–7.7)
Neutrophils Relative %: 46 %
Platelets: 223 10*3/uL (ref 150–400)
RBC: 4.34 MIL/uL (ref 3.87–5.11)
RDW: 14.6 % (ref 11.5–15.5)
WBC: 3.3 10*3/uL — ABNORMAL LOW (ref 4.0–10.5)
nRBC: 0 % (ref 0.0–0.2)

## 2022-01-19 LAB — COMPREHENSIVE METABOLIC PANEL
ALT: 51 U/L — ABNORMAL HIGH (ref 0–44)
AST: 43 U/L — ABNORMAL HIGH (ref 15–41)
Albumin: 3.7 g/dL (ref 3.5–5.0)
Alkaline Phosphatase: 543 U/L — ABNORMAL HIGH (ref 38–126)
Anion gap: 5 (ref 5–15)
BUN: 9 mg/dL (ref 8–23)
CO2: 30 mmol/L (ref 22–32)
Calcium: 9.2 mg/dL (ref 8.9–10.3)
Chloride: 102 mmol/L (ref 98–111)
Creatinine, Ser: 0.76 mg/dL (ref 0.44–1.00)
GFR, Estimated: >60 mL/min (ref >60)
Glucose, Bld: 144 mg/dL — ABNORMAL HIGH (ref 70–99)
Potassium: 3.5 mmol/L (ref 3.5–5.1)
Sodium: 137 mmol/L (ref 135–145)
Total Bilirubin: 0.4 mg/dL (ref 0.3–1.2)
Total Protein: 8.4 g/dL — ABNORMAL HIGH (ref 6.5–8.1)

## 2022-01-20 ENCOUNTER — Telehealth: Payer: Self-pay

## 2022-01-20 NOTE — Telephone Encounter (Signed)
Spoke with patients daughter Elmo Putt. She would like a call to schedule patients virtual appointment. Please call Elmo Putt to schedule to make sure she will be with patient to help her do the visit. This is to reschedule visit from yesterday to discuss CT scan results. Thank you!

## 2022-01-21 LAB — CANCER ANTIGEN 19-9: CA 19-9: 170 U/mL — ABNORMAL HIGH (ref 0–35)

## 2022-01-21 NOTE — Telephone Encounter (Signed)
Appt scheduled for 10/17

## 2022-01-21 NOTE — Progress Notes (Signed)
[place]  [phone] Fax:(336) [phone]  ID: Meredith Wilcox OB: [birth year]  MR#: [id number]  CSN#:[id number]  Patient Care Team: [name], MD as PCP - General (Family Medicine) Birder Robson, MD as Consulting Physician (Ophthalmology) Clent Jacks, RN as Oncology Nurse Navigator  I connected with Meredith Wilcox on 01/26/22 at  2:45 PM EDT by video enabled telemedicine visit and verified that I am speaking with the correct person using two identifiers.   I discussed the limitations, risks, security and privacy concerns of performing an evaluation and management service by telemedicine and the availability of in-person appointments. I also discussed with the patient that there may be a patient responsible charge related to this service. The patient expressed understanding and agreed to proceed.   Other persons participating in the visit and their role in the encounter: Patient, MD.  Patient's location: Home. Provider's location: Clinic.  CHIEF COMPLAINT: Pancreatic mass  INTERVAL HISTORY: Patient agreed to video assisted telemedicine visit for further evaluation and discussion of her imaging and laboratory results.  She complains of some unintentional weight loss, but otherwise feels well. She denies any abdominal pain.  She has no neurologic complaints.  She denies any recent fevers or illnesses. She has no chest pain, shortness of breath, cough, or hemoptysis.  She has no nausea, vomiting, constipation, or diarrhea. She has no urinary complaints.  Patient offers no further specific complaints today.  REVIEW OF SYSTEMS:   Review of Systems  Constitutional:  Positive for weight loss. Negative for fever and malaise/fatigue.  Respiratory: Negative.  Negative for cough, hemoptysis and shortness of breath.   Cardiovascular: Negative.  Negative for chest pain and leg swelling.  Gastrointestinal: Negative.  Negative for abdominal pain and heartburn.   Genitourinary: Negative.  Negative for dysuria.  Musculoskeletal: Negative.  Negative for back pain.  Skin: Negative.  Negative for rash.  Neurological: Negative.  Negative for dizziness, seizures, weakness and headaches.  Psychiatric/Behavioral: Negative.  The patient is not nervous/anxious.     As per HPI. Otherwise, a complete review of systems is negative.  PAST MEDICAL HISTORY: Past Medical History:  Diagnosis Date   Anemia    H/O DURING PREGNANCY   Arthritis    Chronic kidney disease    KIDNEY PROBLEMS AROUND AGE 24   Geographic tongue    GERD (gastroesophageal reflux disease)    H/O NO MEDS   Helicobacter pylori gastrointestinal tract infection    Hyperlipidemia    Incontinence    Indigestion    Prolapse of vaginal walls    Right sided sciatica    Tachycardia    Urethral prolapse     PAST SURGICAL HISTORY: Past Surgical History:  Procedure Laterality Date   BREAST BIOPSY Left 10/11/2016   left breast stereo/ VASCULAR LESION   BREAST BIOPSY Left 12/02/2016   Procedure: NEEDLE LOCALIZATION;  Surgeon: [name], MD;  Location: [facility] ORS;  Service: General;  Laterality: Left;   BREAST EXCISIONAL BIOPSY Left 12/02/2016   left lumpectomy   BREAST LUMPECTOMY Left 12/02/2016   Procedure: EXCISION BREAST MASS;  Surgeon: [name], MD;  Location: [facility] ORS;  Service: General;  Laterality: Left;   DILATION AND CURETTAGE OF UTERUS     dnc     1983   ERCP N/A 07/07/2021   Procedure: ENDOSCOPIC RETROGRADE CHOLANGIOPANCREATOGRAPHY (ERCP);  Surgeon: [name], MD;  Location: [facility] ENDOSCOPY;  Service: Endoscopy;  Laterality: N/A;   ERCP N/A 10/01/2021   Procedure: ENDOSCOPIC RETROGRADE CHOLANGIOPANCREATOGRAPHY (ERCP);  Surgeon: [name], MD;  Location: [facility] ENDOSCOPY;  Service: Endoscopy;  Laterality: N/A;   fatty tumor removal Right    hand    FAMILY HISTORY: Family History  Problem Relation Age of Onset   Diabetes Mother    Hypertension  Mother    Cardiomyopathy Sister    Down syndrome Sister    Leukemia Brother    Alcohol abuse Son        youngest son   Asthma Son        youngest son   Rheum arthritis Sister    Drug abuse Child 31   Breast cancer Neg Hx    Kidney cancer Neg Hx    Bladder Cancer Neg Hx    Prostate cancer Neg Hx     ADVANCED DIRECTIVES (Y/N):  N  HEALTH MAINTENANCE: Social History   Tobacco Use   Smoking status: Never   Smokeless tobacco: Never   Tobacco comments:    smoking cessation materials not required  Vaping Use   Vaping Use: Never used  Substance Use Topics   Alcohol use: No    Alcohol/week: 0.0 standard drinks of alcohol   Drug use: No     Colonoscopy:  PAP:  Bone density:  Lipid panel:  Allergies  Allergen Reactions   Tussionex Pennkinetic Er [Hydrocod Poli-Chlorphe Poli Er] Other (See Comments)    Real dizzy headed even after a while still walking every direction but straight, patient stated she stopped taking it because of that.   Hydrocodone     Light headed and dizziness    Current Outpatient Medications  Medication Sig Dispense Refill   amLODipine (NORVASC) 2.5 MG tablet Take 1 tablet (2.5 mg total) by mouth every evening. (Patient not taking: Reported on 01/19/2022) 90 tablet 0   No current facility-administered medications for this visit.    OBJECTIVE: There were no vitals filed for this visit.    There is no height or weight on file to calculate BMI.    ECOG FS:0 - Asymptomatic  General: Well-developed, well-nourished, no acute distress. HEENT: Normocephalic. Neuro: Alert, answering all questions appropriately. Cranial nerves grossly intact. Psych: Normal affect.   LAB RESULTS:  Lab Results  Component Value Date   NA 137 01/19/2022   K 3.5 01/19/2022   CL 102 01/19/2022   CO2 30 01/19/2022   GLUCOSE 144 (H) 01/19/2022   BUN 9 01/19/2022   CREATININE 0.76 01/19/2022   CALCIUM 9.2 01/19/2022   PROT 8.4 (H) 01/19/2022   ALBUMIN 3.7 01/19/2022    AST 43 (H) 01/19/2022   ALT 51 (H) 01/19/2022   ALKPHOS 543 (H) 01/19/2022   BILITOT 0.4 01/19/2022   GFRNONAA >60 01/19/2022   GFRAA 84 08/13/2020    Lab Results  Component Value Date   WBC 3.3 (L) 01/19/2022   NEUTROABS 1.5 (L) 01/19/2022   HGB 12.6 01/19/2022   HCT 38.6 01/19/2022   MCV 88.9 01/19/2022   PLT 223 01/19/2022     STUDIES: CT Abdomen W Contrast  Result Date: 01/19/2022 CLINICAL DATA:  Follow-up pancreatic mass. * Tracking Code: BO * EXAM: CT ABDOMEN WITH CONTRAST TECHNIQUE: Multidetector CT imaging of the abdomen was performed using the standard protocol following bolus administration of intravenous contrast. RADIATION DOSE REDUCTION: This exam was performed according to the departmental dose-optimization program which includes automated exposure control, adjustment of the mA and/or kV according to patient size and/or use of iterative reconstruction technique. CONTRAST:  64m OMNIPAQUE IOHEXOL 300 MG/ML  SOLN COMPARISON:  09/14/2021 CT abdomen/pelvis. FINDINGS: Lower chest: No significant pulmonary nodules or acute consolidative airspace disease. Hepatobiliary: Normal liver size. No liver masses. Cholelithiasis. Mild diffuse intrahepatic biliary ductal dilatation, mildly increased. Stable position of common bile duct stent terminating in the transverse duodenum. No evidence of intrahepatic pneumobilia on today's scan. Pancreas: Ill-defined hypodense 2.3 x 1.8 cm pancreatic head mass (series 6/image 23), increased from 1.7 x 1.7 cm on 09/14/2021 CT. No significant pancreatic duct dilation. No additional pancreatic lesions. Spleen: Normal size. No mass. Adrenals/Urinary Tract: Normal adrenals. Normal kidneys with no hydronephrosis and no renal mass. Stomach/Bowel: Normal non-distended stomach. Visualized small and large bowel is normal caliber, with no bowel wall thickening. Oral contrast transits to the right colon. Vascular/Lymphatic: Atherosclerotic nonaneurysmal abdominal  aorta. Patent hepatic, splenic and renal veins. Pancreatic head mass now abuts and mildly narrows the main portal vein, which remains patent. Enlarged porta hepatis 1.4 cm node (series 2/image 19), increased from 1.0 cm. No additional pathologically enlarged lymph nodes in the abdomen. Other: No pneumoperitoneum, ascites or focal fluid collection. Musculoskeletal: No aggressive appearing focal osseous lesions. Mild lumbar spondylosis. IMPRESSION: 1. Ill-defined hypodense 2.3 cm pancreatic head mass, increased in size since 09/14/2021 CT, compatible with primary pancreatic malignancy. This mass now abuts and partially narrows the main portal vein, which remains patent. 2. Stable position of common bile duct stent. Mild diffuse intrahepatic biliary ductal dilatation, mildly increased. No evidence of intrahepatic pneumobilia on today's scan, which may indicate new stent dysfunction. 3. Enlarged porta hepatis lymph node, increased, suspicious for nodal metastasis. 4. Cholelithiasis. 5.  Aortic Atherosclerosis (ICD10-I70.0). Electronically Signed   By: Ilona Sorrel M.D.   On: 01/19/2022 18:03    ASSESSMENT: Pancreatic mass.  PLAN:    1.  Pancreatic mass:  Patient underwent ERCP with stenting with improvement of her hyperbilirubinemia.  EUS at [facility] on July 31, 2021 did not reveal any pancreatic abnormality and no biopsies were taken.  Repeat CT scan on January 19, 2022 reviewed independently and reported as above with increasing size of suspicious pancreatic lesion now measuring 2.3 cm.  Her CA 19-9 is also increasing and now 170.  Referral has been made back to [facility] for repeat EUS for stent replacement as well as biopsy.  Return to clinic 1 week after her procedure to discuss the results.  2.  Hyperbilirubinemia: Resolved.  Replaced stenting as above. 3.  Transaminitis: AST and ALT continue to trend down.  Monitor.   4.  Alkaline phosphatase: Trending up slightly, monitor.   5.   Leukopenia: Chronic and unchanged. 6.  Weight loss: Monitor.  Consider dietary referral in the future.  I provided 20 minutes of face-to-face video visit time during this encounter which included chart review, counseling, and coordination of care as documented above.   Patient expressed understanding and was in agreement with this plan. She also understands that She can call clinic at any time with any questions, concerns, or complaints.    Cancer Staging  No matching staging information was found for the patient.  Lloyd Huger, MD   01/26/2022 3:03 PM

## 2022-01-26 ENCOUNTER — Inpatient Hospital Stay (HOSPITAL_BASED_OUTPATIENT_CLINIC_OR_DEPARTMENT_OTHER): Payer: Medicare Other | Admitting: Oncology

## 2022-01-26 DIAGNOSIS — K8689 Other specified diseases of pancreas: Secondary | ICD-10-CM | POA: Diagnosis not present

## 2022-01-26 NOTE — Progress Notes (Signed)
Referral for EUS sent to [facility]. They will contact Ms. Biagini for scheduling.

## 2022-02-02 ENCOUNTER — Telehealth: Payer: Self-pay

## 2022-02-02 NOTE — Telephone Encounter (Signed)
Message sent to [facility] to follow up on scheduling of EUS.

## 2022-02-17 ENCOUNTER — Ambulatory Visit: Payer: Medicare Other | Admitting: Family Medicine

## 2022-02-17 DIAGNOSIS — K8689 Other specified diseases of pancreas: Secondary | ICD-10-CM | POA: Diagnosis not present

## 2022-02-17 DIAGNOSIS — K838 Other specified diseases of biliary tract: Secondary | ICD-10-CM | POA: Diagnosis not present

## 2022-02-17 DIAGNOSIS — Z86718 Personal history of other venous thrombosis and embolism: Secondary | ICD-10-CM | POA: Diagnosis not present

## 2022-02-17 DIAGNOSIS — Z833 Family history of diabetes mellitus: Secondary | ICD-10-CM | POA: Diagnosis not present

## 2022-02-17 DIAGNOSIS — R933 Abnormal findings on diagnostic imaging of other parts of digestive tract: Secondary | ICD-10-CM | POA: Diagnosis not present

## 2022-02-17 DIAGNOSIS — K8051 Calculus of bile duct without cholangitis or cholecystitis with obstruction: Secondary | ICD-10-CM | POA: Diagnosis not present

## 2022-02-17 DIAGNOSIS — Z4659 Encounter for fitting and adjustment of other gastrointestinal appliance and device: Secondary | ICD-10-CM | POA: Diagnosis not present

## 2022-02-17 DIAGNOSIS — K8050 Calculus of bile duct without cholangitis or cholecystitis without obstruction: Secondary | ICD-10-CM | POA: Diagnosis not present

## 2022-02-17 DIAGNOSIS — C250 Malignant neoplasm of head of pancreas: Secondary | ICD-10-CM | POA: Diagnosis not present

## 2022-02-17 DIAGNOSIS — Z9689 Presence of other specified functional implants: Secondary | ICD-10-CM | POA: Diagnosis not present

## 2022-02-17 DIAGNOSIS — K5710 Diverticulosis of small intestine without perforation or abscess without bleeding: Secondary | ICD-10-CM | POA: Diagnosis not present

## 2022-02-17 DIAGNOSIS — K831 Obstruction of bile duct: Secondary | ICD-10-CM | POA: Diagnosis not present

## 2022-02-23 ENCOUNTER — Telehealth: Payer: Self-pay

## 2022-02-23 NOTE — Telephone Encounter (Signed)
Dr. Grayland Ormond has been notified of pathology from EUS. Metal stent was also placed.

## 2022-02-24 ENCOUNTER — Inpatient Hospital Stay: Payer: Medicare Other | Attending: Oncology | Admitting: Oncology

## 2022-02-24 ENCOUNTER — Encounter: Payer: Self-pay | Admitting: Oncology

## 2022-02-24 VITALS — BP 150/79 | HR 84 | Temp 98.5°F | Resp 18 | Wt 122.6 lb

## 2022-02-24 DIAGNOSIS — R7401 Elevation of levels of liver transaminase levels: Secondary | ICD-10-CM | POA: Insufficient documentation

## 2022-02-24 DIAGNOSIS — D72819 Decreased white blood cell count, unspecified: Secondary | ICD-10-CM | POA: Diagnosis not present

## 2022-02-24 DIAGNOSIS — K8689 Other specified diseases of pancreas: Secondary | ICD-10-CM

## 2022-02-24 DIAGNOSIS — C259 Malignant neoplasm of pancreas, unspecified: Secondary | ICD-10-CM | POA: Diagnosis not present

## 2022-02-24 DIAGNOSIS — R978 Other abnormal tumor markers: Secondary | ICD-10-CM | POA: Diagnosis not present

## 2022-02-24 DIAGNOSIS — R634 Abnormal weight loss: Secondary | ICD-10-CM | POA: Diagnosis not present

## 2022-02-24 NOTE — Progress Notes (Signed)
F/U after ERCP/ EUS. Replaced a stent. Has burping with bitter taste in mouth. Has abdominal discomfort especially at nighttime trying to get comfortable in bed. Normal stools. No jaundiced.

## 2022-02-24 NOTE — Progress Notes (Signed)
Referral sent to Dr. Zenia Resides at [facility].

## 2022-02-24 NOTE — Progress Notes (Signed)
[place]  Telephone:(336) [phone] Fax:(336) [phone]  ID: Meredith Wilcox OB: [birth year]  MR#: [id number]  LZJ#:[id number]  Patient Care Team: [name], MD as PCP - General (Family Medicine) Birder Robson, MD as Consulting Physician (Ophthalmology) Clent Jacks, RN as Oncology Nurse Navigator  CHIEF COMPLAINT: Pancreatic adenocarcinoma.  INTERVAL HISTORY: Patient returns to clinic today for further evaluation, discussion of her biopsy results, and additional diagnostic planning.  She continues to feel well and remains asymptomatic. She denies any abdominal pain.  She has no neurologic complaints.  She denies any recent fevers or illnesses. She has no chest pain, shortness of breath, cough, or hemoptysis.  She has no nausea, vomiting, constipation, or diarrhea. She has no urinary complaints.  Patient offers no further specific complaints today.  REVIEW OF SYSTEMS:   Review of Systems  Constitutional: Negative.  Negative for fever, malaise/fatigue and weight loss.  Respiratory: Negative.  Negative for cough, hemoptysis and shortness of breath.   Cardiovascular: Negative.  Negative for chest pain and leg swelling.  Gastrointestinal: Negative.  Negative for abdominal pain and heartburn.  Genitourinary: Negative.  Negative for dysuria.  Musculoskeletal: Negative.  Negative for back pain.  Skin: Negative.  Negative for rash.  Neurological: Negative.  Negative for dizziness, seizures, weakness and headaches.  Psychiatric/Behavioral: Negative.  The patient is not nervous/anxious.     As per HPI. Otherwise, a complete review of systems is negative.  PAST MEDICAL HISTORY: Past Medical History:  Diagnosis Date   Anemia    H/O DURING PREGNANCY   Arthritis    Chronic kidney disease    KIDNEY PROBLEMS AROUND AGE 76   Geographic tongue    GERD (gastroesophageal reflux disease)    H/O NO MEDS   Helicobacter pylori gastrointestinal tract infection     Hyperlipidemia    Incontinence    Indigestion    Prolapse of vaginal walls    Right sided sciatica    Tachycardia    Urethral prolapse     PAST SURGICAL HISTORY: Past Surgical History:  Procedure Laterality Date   BREAST BIOPSY Left 10/11/2016   left breast stereo/ VASCULAR LESION   BREAST BIOPSY Left 12/02/2016   Procedure: NEEDLE LOCALIZATION;  Surgeon: [name], MD;  Location: [facility] ORS;  Service: General;  Laterality: Left;   BREAST EXCISIONAL BIOPSY Left 12/02/2016   left lumpectomy   BREAST LUMPECTOMY Left 12/02/2016   Procedure: EXCISION BREAST MASS;  Surgeon: [name], MD;  Location: [facility] ORS;  Service: General;  Laterality: Left;   DILATION AND CURETTAGE OF UTERUS     dnc     1983   ERCP N/A 07/07/2021   Procedure: ENDOSCOPIC RETROGRADE CHOLANGIOPANCREATOGRAPHY (ERCP);  Surgeon: [name], MD;  Location: [facility] ENDOSCOPY;  Service: Endoscopy;  Laterality: N/A;   ERCP N/A 10/01/2021   Procedure: ENDOSCOPIC RETROGRADE CHOLANGIOPANCREATOGRAPHY (ERCP);  Surgeon: [name], MD;  Location: [facility] ENDOSCOPY;  Service: Endoscopy;  Laterality: N/A;   fatty tumor removal Right    hand    FAMILY HISTORY: Family History  Problem Relation Age of Onset   Diabetes Mother    Hypertension Mother    Cardiomyopathy Sister    Down syndrome Sister    Leukemia Brother    Alcohol abuse Son        youngest son   Asthma Son        youngest son   Rheum arthritis Sister    Drug abuse Child 63   Breast cancer Neg Hx  Kidney cancer Neg Hx    Bladder Cancer Neg Hx    Prostate cancer Neg Hx     ADVANCED DIRECTIVES (Y/N):  N  HEALTH MAINTENANCE: Social History   Tobacco Use   Smoking status: Never   Smokeless tobacco: Never   Tobacco comments:    smoking cessation materials not required  Vaping Use   Vaping Use: Never used  Substance Use Topics   Alcohol use: No    Alcohol/week: 0.0 standard drinks of alcohol   Drug use: No      Colonoscopy:  PAP:  Bone density:  Lipid panel:  Allergies  Allergen Reactions   Tussionex Pennkinetic Er [Hydrocod Poli-Chlorphe Poli Er] Other (See Comments)    Real dizzy headed even after a while still walking every direction but straight, patient stated she stopped taking it because of that.   Hydrocodone     Light headed and dizziness    Current Outpatient Medications  Medication Sig Dispense Refill   amLODipine (NORVASC) 2.5 MG tablet Take 1 tablet (2.5 mg total) by mouth every evening. (Patient not taking: Reported on 01/19/2022) 90 tablet 0   No current facility-administered medications for this visit.    OBJECTIVE: Vitals:   02/24/22 1103  BP: (!) 150/79  Pulse: 84  Resp: 18  Temp: 98.5 F (36.9 C)  SpO2: 100%      Body mass index is 20.4 kg/m.    ECOG FS:0 - Asymptomatic  General: Well-developed, well-nourished, no acute distress. Eyes: Pink conjunctiva, anicteric sclera. HEENT: Normocephalic, moist mucous membranes. Lungs: No audible wheezing or coughing. Heart: Regular rate and rhythm. Abdomen: Soft, nontender, no obvious distention. Musculoskeletal: No edema, cyanosis, or clubbing. Neuro: Alert, answering all questions appropriately. Cranial nerves grossly intact. Skin: No rashes or petechiae noted. Psych: Normal affect.    LAB RESULTS:  Lab Results  Component Value Date   NA 137 01/19/2022   K 3.5 01/19/2022   CL 102 01/19/2022   CO2 30 01/19/2022   GLUCOSE 144 (H) 01/19/2022   BUN 9 01/19/2022   CREATININE 0.76 01/19/2022   CALCIUM 9.2 01/19/2022   PROT 8.4 (H) 01/19/2022   ALBUMIN 3.7 01/19/2022   AST 43 (H) 01/19/2022   ALT 51 (H) 01/19/2022   ALKPHOS 543 (H) 01/19/2022   BILITOT 0.4 01/19/2022   GFRNONAA >60 01/19/2022   GFRAA 84 08/13/2020    Lab Results  Component Value Date   WBC 3.3 (L) 01/19/2022   NEUTROABS 1.5 (L) 01/19/2022   HGB 12.6 01/19/2022   HCT 38.6 01/19/2022   MCV 88.9 01/19/2022   PLT 223 01/19/2022      STUDIES: No results found.  ASSESSMENT: Pancreatic adenocarcinoma.  PLAN:    1.  Pancreatic adenocarcinoma:  Repeat CT scan on January 19, 2022 reviewed independently with increasing size of suspicious pancreatic lesion now measuring 2.3 cm.  Her CA 19-9 is also increasing and now 170.  Repeat EUS at [facility] finally confirmed adenocarcinoma.  Patient was given a referral to surgery for consideration of resection despite her advanced age.  We also discussed the possibility of chemotherapy, likely using gemcitabine and Abraxane.  Will get a PET scan for further evaluation.  Return to clinic after PET scan and surgical evaluation are completed for further evaluation and definitive treatment planning.   2.  Hyperbilirubinemia: Resolved.  Patient now has a metal stent in place. 3.  Transaminitis: AST and ALT continue to improve. 4.  Alkaline phosphatase: Trending up slightly, monitor.   5.  Leukopenia: Chronic and unchanged. 6.  Weight loss: Monitor.  Consider dietary referral in the future.  I spent a total of 30 minutes reviewing chart data, face-to-face evaluation with the patient, counseling and coordination of care as detailed above.    Patient expressed understanding and was in agreement with this plan. She also understands that She can call clinic at any time with any questions, concerns, or complaints.    Cancer Staging  No matching staging information was found for the patient.  Lloyd Huger, MD   02/24/2022 2:57 PM

## 2022-03-02 NOTE — Progress Notes (Signed)
Noted that Meredith Wilcox has not been scheduled for surgical consult. Referral resent.

## 2022-03-03 ENCOUNTER — Ambulatory Visit: Payer: Self-pay | Admitting: *Deleted

## 2022-03-03 NOTE — Telephone Encounter (Signed)
  Chief Complaint: fatigue, lack of energy Symptoms: lack of energy, heaviness in back and chest Frequency: 11/15- since stint replacement, biopsy Pertinent Negatives: Patient denies chest pain, fever, cough, SOB, vomiting, diarrhea, bleeding, other areas of pain Disposition: '[]'$ ED /'[x]'$ Urgent Care (no appt availability in office) / '[]'$ Appointment(In office/virtual)/ '[]'$  [facility]/ '[]'$ Home Care/ '[]'$ Refused Recommended Disposition /'[]'$ [place]/ '[]'$  Follow-up with PCP Additional Notes: Patient reports no pain- she just is very fatigued- advised UC for evaluation.    Reason for Disposition  [1] MODERATE weakness (i.e., interferes with work, school, normal activities) AND [2] cause unknown  (Exceptions: Weakness from acute minor illness or poor fluid intake; weakness is chronic and not worse.)  Answer Assessment - Initial Assessment Questions 1. DESCRIPTION: "Describe how you are feeling."     No energy- has to rest, heavy feeling in back and chest- pressure 2. SEVERITY: "How bad is it?"  "Can you stand and walk?"   - MILD (0-3): Feels weak or tired, but does not interfere with work, school or normal activities.   - MODERATE (4-7): Able to stand and walk; weakness interferes with work, school, or normal activities.   - SEVERE (8-10): Unable to stand or walk; unable to do usual activities.     moderate 3. ONSET: "When did these symptoms begin?" (e.g., hours, days, weeks, months)     02/24/22- had procedure- biopsy and stint replacement 4. CAUSE: "What do you think is causing the weakness or fatigue?" (e.g., not drinking enough fluids, medical problem, trouble sleeping)     Not sure- medical problem 5. NEW MEDICINES:  "Have you started on any new medicines recently?" (e.g., opioid pain medicines, benzodiazepines, muscle relaxants, antidepressants, antihistamines, neuroleptics, beta blockers)     no 6. OTHER SYMPTOMS: "Do you have any other symptoms?" (e.g., chest pain, fever,  cough, SOB, vomiting, diarrhea, bleeding, other areas of pain)     no  Protocols used: Weakness (Generalized) and Fatigue-A-AH

## 2022-03-08 ENCOUNTER — Ambulatory Visit
Admission: RE | Admit: 2022-03-08 | Discharge: 2022-03-08 | Disposition: A | Discharge status: Home / Self Care | Payer: Medicare Other | Source: Ambulatory Visit | Attending: Oncology | Admitting: Oncology

## 2022-03-08 DIAGNOSIS — I700 Atherosclerosis of aorta: Secondary | ICD-10-CM | POA: Diagnosis not present

## 2022-03-08 DIAGNOSIS — R918 Other nonspecific abnormal finding of lung field: Secondary | ICD-10-CM | POA: Insufficient documentation

## 2022-03-08 DIAGNOSIS — C787 Secondary malignant neoplasm of liver and intrahepatic bile duct: Secondary | ICD-10-CM | POA: Insufficient documentation

## 2022-03-08 DIAGNOSIS — K5730 Diverticulosis of large intestine without perforation or abscess without bleeding: Secondary | ICD-10-CM | POA: Diagnosis not present

## 2022-03-08 DIAGNOSIS — M160 Bilateral primary osteoarthritis of hip: Secondary | ICD-10-CM | POA: Insufficient documentation

## 2022-03-08 DIAGNOSIS — D259 Leiomyoma of uterus, unspecified: Secondary | ICD-10-CM | POA: Diagnosis not present

## 2022-03-08 DIAGNOSIS — C259 Malignant neoplasm of pancreas, unspecified: Secondary | ICD-10-CM | POA: Diagnosis not present

## 2022-03-08 DIAGNOSIS — R911 Solitary pulmonary nodule: Secondary | ICD-10-CM | POA: Diagnosis not present

## 2022-03-08 LAB — GLUCOSE, CAPILLARY: Glucose-Capillary: 96 mg/dL (ref 70–99)

## 2022-03-08 MED ORDER — FLUDEOXYGLUCOSE F - 18 (FDG) INJECTION
6.3000 | Freq: Once | INTRAVENOUS | Status: AC | PRN
  Administered 2022-03-08: 6.29 via INTRAVENOUS

## 2022-03-12 NOTE — Progress Notes (Unsigned)
Name: Meredith Wilcox   MRN: [id number]    DOB: 11/23/1940   Date:03/15/2022       Progress Note  Subjective  Chief Complaint  Follow Up  I connected with  Meredith Wilcox  on 03/15/22 at  1:40 PM EST by a telephone  telemedicine application and verified that I am speaking with the correct person using two identifiers.  I discussed the limitations of evaluation and management by telemedicine and the availability of in person appointments. The patient expressed understanding and agreed to proceed with the virtual visit  Staff also discussed with the patient that there may be a patient responsible charge related to this service. Patient Location: at home Provider Location: at home - pending COVID-19 results  Additional Individuals present: alone  HPI  Pancreatic cancer with liver metastasis : she was admitted to [facility] on 07/07/2021, she had painless jaundice and acholic stools with dark urine ( that was the reason for her visit with Meredith Wilcox 03/27), she was diagnosed with a mass, elevated CA19-9, elevated LFT's, she had stent placed and symptoms improved, LFT's and bilirubin still high. She had another stent placed on 10/01/2021. Repeat CT done 10/10 showed increase in size of mass  and EUS at [facility] confirmed adenocarcinoma  She had a fine needle aspiration of head of pancreas done on 02/17/2022 that was considered inconclusive  (atypical ductal cells )  She continues to feel bloated, and has some epigastric bloating and discomfort. Biopsy positive for atypical cells and PET scan positive for liver metastasis. She is a faithful woman and is accepting of diagnosis. She has allowed friends to drop off food and taking naps when tired. She likes going in her yard and is still exercising.   Malnutrition moderate : she has lost 32 lbs since Feb 2023 and 15 lbs since August 2023, discussed high calorie diet, she states appetite is good, she has some indigestion but able to keep food down.    Cystocele : does not  like pessary, seen by Urologist last visit 05/2019 she is using estrogen cream three times a week,, but stopped due to pancreatic mass, she saw  Dr. Matilde Sprang discussed hysterectomy and cystocele repair. She states she is not ready for surgery at this time. She still has urinary urgency and frequency. Stable    History of DVT: Spring of 2019, resolved on repeat doppler US 10/2017. No problems since. She denies leg pain, swelling or SOB. Unchanged   Left  knee OA: symptoms are mild intermittent, not taking medication, no swelling, redness and is not affecting her level of activity . Feeling well     Patient Active Problem List   Diagnosis Date Noted   Mild protein-calorie malnutrition ([place]) 11/16/2021   Other neutropenia ([place]) 11/16/2021   Primary osteoarthritis of left knee 11/16/2021   Obstructive jaundice 07/07/2021   Abnormal CT of the pancreas 07/07/2021   GERD (gastroesophageal reflux disease) 07/07/2021   Elevated blood pressure reading with diagnosis of hypertension 07/07/2021   Pancreatic mass    Vaginal discharge 01/27/2017   Urethral caruncle 12/21/2016   Cystocele, midline 12/21/2016   Vaginal atrophy 12/21/2016   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   Benign migrating glossitis 66/09/3014   History of Helicobacter pylori infection 11/16/2014   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 11/16/2014   Incontinence 11/16/2014   Prolapse of urethra 11/16/2014    Past Surgical History:  Procedure Laterality Date   BREAST BIOPSY Left 10/11/2016   left breast stereo/ VASCULAR  LESION   BREAST BIOPSY Left 12/02/2016   Procedure: NEEDLE LOCALIZATION;  Surgeon: [name], MD;  Location: [facility] ORS;  Service: General;  Laterality: Left;   BREAST EXCISIONAL BIOPSY Left 12/02/2016   left lumpectomy   BREAST LUMPECTOMY Left 12/02/2016   Procedure: EXCISION BREAST MASS;  Surgeon: [name], MD;  Location: [facility] ORS;  Service: General;  Laterality:  Left;   DILATION AND CURETTAGE OF UTERUS     dnc     1983   ERCP N/A 07/07/2021   Procedure: ENDOSCOPIC RETROGRADE CHOLANGIOPANCREATOGRAPHY (ERCP);  Surgeon: [name], MD;  Location: [facility] ENDOSCOPY;  Service: Endoscopy;  Laterality: N/A;   ERCP N/A 10/01/2021   Procedure: ENDOSCOPIC RETROGRADE CHOLANGIOPANCREATOGRAPHY (ERCP);  Surgeon: [name], MD;  Location: [facility] ENDOSCOPY;  Service: Endoscopy;  Laterality: N/A;   fatty tumor removal Right    hand    Family History  Problem Relation Age of Onset   Diabetes Mother    Hypertension Mother    Cardiomyopathy Sister    Down syndrome Sister    Leukemia Brother    Alcohol abuse Son        youngest son   Asthma Son        youngest son   Rheum arthritis Sister    Drug abuse Child 57   Breast cancer Neg Hx    Kidney cancer Neg Hx    Bladder Cancer Neg Hx    Prostate cancer Neg Hx     Social History   Socioeconomic History   Marital status: Married    Spouse name: [name]   Number of children: 4   Years of education: some college   Highest education level: 12th grade  Occupational History   Occupation: Retired  Tobacco Use   Smoking status: Never   Smokeless tobacco: Never   Tobacco comments:    smoking cessation materials not required  Vaping Use   Vaping Use: Never used  Substance and Sexual Activity   Alcohol use: No    Alcohol/week: 0.0 standard drinks of alcohol   Drug use: No   Sexual activity: Not Currently    Birth control/protection: Post-menopausal  Other Topics Concern   Not on file  Social History Narrative   Not on file   Social Determinants of Health   Financial Resource Strain: Low Risk  (12/31/2021)   Overall Financial Resource Strain (CARDIA)    Difficulty of Paying Living Expenses: Not hard at all  Food Insecurity: No Food Insecurity (12/31/2021)   Hunger Vital Sign    Worried About Running Out of Food in the Last Year: Never true    [place] in the Last Year: Never true   Transportation Needs: No Transportation Needs (12/31/2021)   PRAPARE - Hydrologist (Medical): No    Lack of Transportation (Non-Medical): No  Physical Activity: Sufficiently Active (12/31/2021)   Exercise Vital Sign    Days of Exercise per Week: 7 days    Minutes of Exercise per Session: 30 min  Stress: No Stress Concern Present (12/31/2021)   [place]    Feeling of Stress : Not at all  Social Connections: [place] (12/31/2021)   Social Connection and Isolation Panel [NHANES]    Frequency of Communication with Friends and Family: More than three times a week    Frequency of Social Gatherings with Friends and Family: More than three times a week  Attends Religious Services: More than 4 times per year    Active Member of Clubs or Organizations: Yes    Attends Archivist Meetings: More than 4 times per year    Marital Status: Married  Human resources officer Violence: Not At Risk (12/31/2021)   Humiliation, Afraid, Rape, and Kick questionnaire    Fear of Current or Ex-Partner: No    Emotionally Abused: No    Physically Abused: No    Sexually Abused: No     Current Outpatient Medications:    amLODipine (NORVASC) 2.5 MG tablet, Take 1 tablet (2.5 mg total) by mouth every evening. (Patient not taking: Reported on 03/15/2022), Disp: 90 tablet, Rfl: 0  Allergies  Allergen Reactions   Tussionex Pennkinetic Er [Hydrocod Poli-Chlorphe Poli Er] Other (See Comments)    Real dizzy headed even after a while still walking every direction but straight, patient stated she stopped taking it because of that.   Hydrocodone     Light headed and dizziness    I personally reviewed active problem list, medication list, allergies, family history, social history, health maintenance with the patient/caregiver today.   ROS  Ten systems reviewed and is negative except as mentioned in HPI    Objective  Today's Vitals   03/15/22 1336  BP: 132/70  Pulse: 90  Resp: 16  SpO2: 96%  Weight: 118 lb (53.5 kg)  Height: '5\' 5"'$  (1.651 m)    Body mass index is 19.64 kg/m.  Physical Exam  Awake, alert and oriented   PHQ2/9:    03/15/2022    1:06 PM 11/16/2021    9:57 AM 08/13/2021    9:58 AM 07/06/2021    8:57 AM 05/18/2021    8:30 AM  Depression screen PHQ 2/9  Decreased Interest 0 0 0 0 0  Down, Depressed, Hopeless 0 0 0 0 0  PHQ - 2 Score 0 0 0 0 0  Altered sleeping 0 0 0 0 0  Tired, decreased energy 0 0 0 0 0  Change in appetite 0 0 0 0 0  Feeling bad or failure about yourself  0 0 0 0 0  Trouble concentrating 0 0 0 0 0  Moving slowly or fidgety/restless 0 0 0 0 0  Suicidal thoughts 0 0 0 0 0  PHQ-9 Score 0 0 0 0 0  Difficult doing work/chores    Not difficult at all    PHQ-2/9 Result is negative.    Fall Risk:    03/15/2022    1:06 PM 11/16/2021    9:57 AM 08/13/2021    9:58 AM 07/06/2021    8:57 AM 05/18/2021    8:30 AM  Fall Risk   Falls in the past year? 0 0 0 0 0  Number falls in past yr: 0   0 0  Injury with Fall? 0   0 0  Risk for fall due to : No Fall Risks No Fall Risks No Fall Risks No Fall Risks No Fall Risks  Follow up Falls prevention discussed Falls prevention discussed Falls prevention discussed Falls prevention discussed Falls prevention discussed     Assessment & Plan  1. Pancreatic cancer metastasized to liver [facility])  She will see surgeon tomorrow, advised to ask one of her children to go with her or at least have them over the phone   2. Moderate protein-calorie malnutrition ([place])  She continues to lose weight. She has been able to eat   3. Other neutropenia ([facility])  Stable, under  the care of Dr. Grayland Ormond for cancer now   4. Primary osteoarthritis of left knee  Doing well   5. History of DVT (deep vein thrombosis)   In 2019, no symptoms or recurrence since   6. Pelvic floor relaxation  Stable    I discussed the assessment  and treatment plan with the patient. The patient was provided an opportunity to ask questions and all were answered. The patient agreed with the plan and demonstrated an understanding of the instructions.  The patient was advised to call back or seek an in-person evaluation if the symptoms worsen or if the condition fails to improve as anticipated.  I provided 25 minutes of non-face-to-face time during this encounter.

## 2022-03-15 ENCOUNTER — Telehealth (INDEPENDENT_AMBULATORY_CARE_PROVIDER_SITE_OTHER): Payer: Medicare Other | Admitting: Family Medicine

## 2022-03-15 ENCOUNTER — Encounter: Payer: Self-pay | Admitting: Family Medicine

## 2022-03-15 VITALS — BP 132/70 | HR 90 | Resp 16 | Ht 65.0 in | Wt 118.0 lb

## 2022-03-15 DIAGNOSIS — N8189 Other female genital prolapse: Secondary | ICD-10-CM

## 2022-03-15 DIAGNOSIS — E440 Moderate protein-calorie malnutrition: Secondary | ICD-10-CM

## 2022-03-15 DIAGNOSIS — C250 Malignant neoplasm of head of pancreas: Secondary | ICD-10-CM | POA: Insufficient documentation

## 2022-03-15 DIAGNOSIS — C259 Malignant neoplasm of pancreas, unspecified: Secondary | ICD-10-CM

## 2022-03-15 DIAGNOSIS — M1712 Unilateral primary osteoarthritis, left knee: Secondary | ICD-10-CM

## 2022-03-15 DIAGNOSIS — D708 Other neutropenia: Secondary | ICD-10-CM

## 2022-03-15 DIAGNOSIS — Z86718 Personal history of other venous thrombosis and embolism: Secondary | ICD-10-CM | POA: Diagnosis not present

## 2022-03-15 DIAGNOSIS — C787 Secondary malignant neoplasm of liver and intrahepatic bile duct: Secondary | ICD-10-CM

## 2022-03-16 ENCOUNTER — Telehealth: Payer: Self-pay | Admitting: *Deleted

## 2022-03-16 DIAGNOSIS — C259 Malignant neoplasm of pancreas, unspecified: Secondary | ICD-10-CM | POA: Diagnosis not present

## 2022-03-16 NOTE — Telephone Encounter (Signed)
Dr Zenia Resides requesting a return call to discuss this patient [phone]

## 2022-03-26 ENCOUNTER — Telehealth: Payer: Self-pay | Admitting: *Deleted

## 2022-03-26 ENCOUNTER — Encounter: Payer: Self-pay | Admitting: Oncology

## 2022-03-26 ENCOUNTER — Inpatient Hospital Stay: Payer: Medicare Other | Attending: Oncology | Admitting: Oncology

## 2022-03-26 VITALS — BP 155/66 | HR 79 | Temp 96.8°F | Resp 17 | Wt 116.8 lb

## 2022-03-26 DIAGNOSIS — R7401 Elevation of levels of liver transaminase levels: Secondary | ICD-10-CM | POA: Diagnosis not present

## 2022-03-26 DIAGNOSIS — C259 Malignant neoplasm of pancreas, unspecified: Secondary | ICD-10-CM

## 2022-03-26 DIAGNOSIS — R918 Other nonspecific abnormal finding of lung field: Secondary | ICD-10-CM | POA: Diagnosis not present

## 2022-03-26 DIAGNOSIS — C787 Secondary malignant neoplasm of liver and intrahepatic bile duct: Secondary | ICD-10-CM | POA: Insufficient documentation

## 2022-03-26 DIAGNOSIS — Z5111 Encounter for antineoplastic chemotherapy: Secondary | ICD-10-CM | POA: Diagnosis not present

## 2022-03-26 DIAGNOSIS — D72819 Decreased white blood cell count, unspecified: Secondary | ICD-10-CM | POA: Insufficient documentation

## 2022-03-26 DIAGNOSIS — C250 Malignant neoplasm of head of pancreas: Secondary | ICD-10-CM | POA: Diagnosis not present

## 2022-03-26 MED ORDER — PROCHLORPERAZINE MALEATE 10 MG PO TABS: 10.0000 mg | ORAL_TABLET | Freq: Four times a day (QID) | ORAL | 2 refills | Status: DC | PRN

## 2022-03-26 MED ORDER — LIDOCAINE-PRILOCAINE 2.5-2.5 % EX CREA: TOPICAL_CREAM | CUTANEOUS | 3 refills | Status: DC

## 2022-03-26 MED ORDER — ONDANSETRON HCL 8 MG PO TABS: 8.0000 mg | ORAL_TABLET | Freq: Three times a day (TID) | ORAL | 2 refills | Status: DC | PRN

## 2022-03-26 NOTE — Progress Notes (Signed)
[place]  Telephone:([phone]) [phone] Fax:(336) [phone]  ID: Meredith Wilcox OB: 1940/05/02  MR#: [id number]  RCB#:[id number]  Patient Care Team: [name], MD as PCP - General (Family Medicine) Birder Robson, MD as Consulting Physician (Ophthalmology) Clent Jacks, RN as Oncology Nurse Navigator  CHIEF COMPLAINT: Stage IV pancreatic adenocarcinoma.  INTERVAL HISTORY: Patient returns to clinic today for further evaluation, discussion of her PET scan results, and treatment planning.  She is anxious, but otherwise feels well.  She denies any abdominal pain, but admits to back "discomfort".  She has no neurologic complaints.  She denies any recent fevers or illnesses. She has no chest pain, shortness of breath, cough, or hemoptysis.  She has no nausea, vomiting, constipation, or diarrhea. She has no urinary complaints.  Patient offers no further specific complaints today.  REVIEW OF SYSTEMS:   Review of Systems  Constitutional: Negative.  Negative for fever, malaise/fatigue and weight loss.  Respiratory: Negative.  Negative for cough, hemoptysis and shortness of breath.   Cardiovascular: Negative.  Negative for chest pain and leg swelling.  Gastrointestinal: Negative.  Negative for abdominal pain and heartburn.  Genitourinary: Negative.  Negative for dysuria.  Musculoskeletal: Negative.  Negative for back pain.  Skin: Negative.  Negative for rash.  Neurological: Negative.  Negative for dizziness, seizures, weakness and headaches.  Psychiatric/Behavioral: Negative.  The patient is not nervous/anxious.     As per HPI. Otherwise, a complete review of systems is negative.  PAST MEDICAL HISTORY: Past Medical History:  Diagnosis Date   Anemia    H/O DURING PREGNANCY   Arthritis    Chronic kidney disease    KIDNEY PROBLEMS AROUND AGE 20   Geographic tongue    GERD (gastroesophageal reflux disease)    H/O NO MEDS   Helicobacter pylori gastrointestinal tract  infection    Hyperlipidemia    Incontinence    Indigestion    Prolapse of vaginal walls    Right sided sciatica    Tachycardia    Urethral prolapse     PAST SURGICAL HISTORY: Past Surgical History:  Procedure Laterality Date   BREAST BIOPSY Left 10/11/2016   left breast stereo/ VASCULAR LESION   BREAST BIOPSY Left 12/02/2016   Procedure: NEEDLE LOCALIZATION;  Surgeon: [name], MD;  Location: [facility] ORS;  Service: General;  Laterality: Left;   BREAST EXCISIONAL BIOPSY Left 12/02/2016   left lumpectomy   BREAST LUMPECTOMY Left 12/02/2016   Procedure: EXCISION BREAST MASS;  Surgeon: [name], MD;  Location: [facility] ORS;  Service: General;  Laterality: Left;   DILATION AND CURETTAGE OF UTERUS     dnc     1983   ERCP N/A 07/07/2021   Procedure: ENDOSCOPIC RETROGRADE CHOLANGIOPANCREATOGRAPHY (ERCP);  Surgeon: [name], MD;  Location: [facility] ENDOSCOPY;  Service: Endoscopy;  Laterality: N/A;   ERCP N/A 10/01/2021   Procedure: ENDOSCOPIC RETROGRADE CHOLANGIOPANCREATOGRAPHY (ERCP);  Surgeon: [name], MD;  Location: [facility] ENDOSCOPY;  Service: Endoscopy;  Laterality: N/A;   fatty tumor removal Right    hand    FAMILY HISTORY: Family History  Problem Relation Age of Onset   Diabetes Mother    Hypertension Mother    Cardiomyopathy Sister    Down syndrome Sister    Leukemia Brother    Alcohol abuse Son        youngest son   Asthma Son        youngest son   Rheum arthritis Sister    Drug abuse Child 90  Breast cancer Neg Hx    Kidney cancer Neg Hx    Bladder Cancer Neg Hx    Prostate cancer Neg Hx     ADVANCED DIRECTIVES (Y/N):  N  HEALTH MAINTENANCE: Social History   Tobacco Use   Smoking status: Never   Smokeless tobacco: Never   Tobacco comments:    smoking cessation materials not required  Vaping Use   Vaping Use: Never used  Substance Use Topics   Alcohol use: No    Alcohol/week: 0.0 standard drinks of alcohol   Drug use: No      Colonoscopy:  PAP:  Bone density:  Lipid panel:  Allergies  Allergen Reactions   Tussionex Pennkinetic Er [Hydrocod Poli-Chlorphe Poli Er] Other (See Comments)    Real dizzy headed even after a while still walking every direction but straight, patient stated she stopped taking it because of that.   Hydrocodone     Light headed and dizziness    Current Outpatient Medications  Medication Sig Dispense Refill   amLODipine (NORVASC) 2.5 MG tablet Take 1 tablet (2.5 mg total) by mouth every evening. (Patient not taking: Reported on 03/26/2022) 90 tablet 0   lidocaine-prilocaine (EMLA) cream Apply to affected area once 30 g 3   ondansetron (ZOFRAN) 8 MG tablet Take 1 tablet (8 mg total) by mouth every 8 (eight) hours as needed for nausea or vomiting. 60 tablet 2   prochlorperazine (COMPAZINE) 10 MG tablet Take 1 tablet (10 mg total) by mouth every 6 (six) hours as needed for nausea or vomiting. 60 tablet 2   No current facility-administered medications for this visit.    OBJECTIVE: Vitals:   03/26/22 1002  BP: (!) 155/66  Pulse: 79  Resp: 17  Temp: (!) 96.8 F (36 C)  SpO2: 100%      Body mass index is 19.44 kg/m.    ECOG FS:0 - Asymptomatic  General: Well-developed, well-nourished, no acute distress. Eyes: Pink conjunctiva, anicteric sclera. HEENT: Normocephalic, moist mucous membranes. Lungs: No audible wheezing or coughing. Heart: Regular rate and rhythm. Abdomen: Soft, nontender, no obvious distention. Musculoskeletal: No edema, cyanosis, or clubbing. Neuro: Alert, answering all questions appropriately. Cranial nerves grossly intact. Skin: No rashes or petechiae noted. Psych: Normal affect.  LAB RESULTS:  Lab Results  Component Value Date   NA 137 01/19/2022   K 3.5 01/19/2022   CL 102 01/19/2022   CO2 30 01/19/2022   GLUCOSE 144 (H) 01/19/2022   BUN 9 01/19/2022   CREATININE 0.76 01/19/2022   CALCIUM 9.2 01/19/2022   PROT 8.4 (H) 01/19/2022   ALBUMIN  3.7 01/19/2022   AST 43 (H) 01/19/2022   ALT 51 (H) 01/19/2022   ALKPHOS 543 (H) 01/19/2022   BILITOT 0.4 01/19/2022   GFRNONAA >60 01/19/2022   GFRAA 84 08/13/2020    Lab Results  Component Value Date   WBC 3.3 (L) 01/19/2022   NEUTROABS 1.5 (L) 01/19/2022   HGB 12.6 01/19/2022   HCT 38.6 01/19/2022   MCV 88.9 01/19/2022   PLT 223 01/19/2022     STUDIES: NM PET Image Initial (PI) Skull Base To Thigh  Result Date: 03/09/2022 CLINICAL DATA:  Initial treatment strategy for staging of pancreatic cancer. EXAM: NUCLEAR MEDICINE PET SKULL BASE TO THIGH TECHNIQUE: 6.3 mCi F-18 FDG was injected intravenously. Full-ring PET imaging was performed from the skull base to thigh after the radiotracer. CT data was obtained and used for attenuation correction and anatomic localization. Fasting blood glucose: 96 mg/dl COMPARISON:  01/18/2022 abdominopelvic CT. FINDINGS: Mediastinal blood pool activity: SUV max 1.9 Liver activity: SUV max NA NECK: No areas of abnormal hypermetabolism. Incidental CT findings: No cervical adenopathy. CHEST: No pulmonary parenchymal or thoracic nodal hypermetabolism. Apparent hypermetabolism at the right lung base is favored to be from misregistration of hepatic lesions. No suspicious pulmonary nodule in this region. Incidental CT findings: Biapical pleuroparenchymal scarring. A left upper lobe 6 mm nodule on 72/2 is below PET resolution. 3 mm superior segment left lower lobe pulmonary nodule also on 72/2. ABDOMEN/PELVIS: Development of bilateral hypermetabolic hepatic metastasis. Example within the posterior right hepatic lobe at 1.5 cm and a S.U.V. max of 9.8 on 128/2. Within the anterior aspect of segment 3 at 1.5 cm and a S.U.V. max of 8.6 on 132/2. Foci of hypermetabolism within the porta hepatis are suspicious for nodal metastasis but are suboptimally evaluated secondary to paucity of fat in this area. Alternatively, these could represent subcapsular hepatic lesions.  Hypermetabolism in the region of the pancreatic head, posterior to the right of the common duct stent measures a S.U.V. max of 8.6 including on 151/2. This area is poorly evaluated on nondedicated CT. Incidental CT findings: Normal adrenal glands. No renal calculi or hydronephrosis. Pneumobilia, suggesting stent patency with new metallic stent in place. Extensive colonic diverticulosis. Calcified uterine fibroids. Pelvic floor laxity. SKELETON: No abnormal marrow activity. Incidental CT findings: Mild bilateral hip osteoarthritis. IMPRESSION: 1. Development of bilateral hypermetabolic hepatic metastasis since 01/18/2022. 2. Hypermetabolic pancreatic primary, suboptimally evaluated on non dedicated CT secondary to paucity of abdominal fat. 3. Hypermetabolism in the porta hepatis is suspicious for nodal metastasis but could alternatively represent subcapsular central liver lesions. 4. No evidence of extra abdominal hypermetabolic metastasis. Small nonspecific pulmonary nodules are below PET resolution 5. Incidental findings, including: Aortic Atherosclerosis (ICD10-I70.0). Fibroid uterus. Electronically Signed   By: Abigail Miyamoto M.D.   On: 03/09/2022 14:55    ASSESSMENT: Stage IV pancreatic adenocarcinoma.  PLAN:    1.  Stage IV pancreatic adenocarcinoma.  Repeat CT scan on January 19, 2022 reviewed independently with increasing size of suspicious pancreatic lesion now measuring 2.3 cm.  Her CA 19-9 is also increasing and now 170.  Repeat EUS at [facility] finally confirmed adenocarcinoma.  Patient was given a referral to surgery for consideration of resection despite her advanced age, unfortunately PET scan results from March 09, 2022 reviewed independently and report as above revealing metastatic disease in patient's liver.  After lengthy discussion with the patient, she wishes to pursue systemic chemotherapy using weekly gemcitabine plus Abraxane on days 1, 8, and 15 with day 22 off.  She will require  port placement prior to initiating treatment.  Return to clinic on April 08, 2022 to initiate cycle 1, day 1.  2.  Hyperbilirubinemia: Resolved.  Patient now has a metal stent in place. 3.  Transaminitis: AST and ALT continue to improve. 4.  Alkaline phosphatase: Trending up slightly, monitor.   5.  Leukopenia: Chronic and unchanged. 6.  Weight loss: Monitor.  Consider dietary referral in the future.  I spent a total of 30 minutes reviewing chart data, face-to-face evaluation with the patient, counseling and coordination of care as detailed above.  Patient expressed understanding and was in agreement with this plan. She also understands that She can call clinic at any time with any questions, concerns, or complaints.    Cancer Staging  Malignant tumor of head of pancreas [facility]) Staging form: Exocrine Pancreas, AJCC 8th Edition - Clinical stage from  03/26/2022: Stage IV (cT2, cN0, cM1) - Signed by Lloyd Huger, MD on 03/26/2022 Total positive nodes: 0   Lloyd Huger, MD   03/26/2022 1:12 PM

## 2022-03-26 NOTE — Progress Notes (Signed)
START ON PATHWAY REGIMEN - Pancreatic Adenocarcinoma     A cycle is every 28 days:     Nab-paclitaxel (protein bound)      Gemcitabine   **Always confirm dose/schedule in your pharmacy ordering system**  Patient Characteristics: Metastatic Disease, First Line, PS = 0,1, BRCA1/2 and PALB2  Mutation Absent/Unknown Therapeutic Status: Metastatic Disease Line of Therapy: First Line ECOG Performance Status: 0 BRCA1/2 Mutation Status: Quantity Not Sufficient PALB2 Mutation Status: Quantity Not Sufficient Intent of Therapy: Non-Curative / Palliative Intent, Discussed with Patient

## 2022-03-26 NOTE — Telephone Encounter (Signed)
Faxed order for Port insertion to IR.

## 2022-03-26 NOTE — Progress Notes (Signed)
A lot of lower back pressure. Burping helps her to feel better. Denies nausea. Dry mouth no sour taste in mouth. Does have a metallic taste in her mouth. Fatigue due to back pressure. Eating well.

## 2022-03-28 ENCOUNTER — Other Ambulatory Visit: Payer: Self-pay

## 2022-03-29 ENCOUNTER — Telehealth: Payer: Self-pay | Admitting: *Deleted

## 2022-03-29 NOTE — Telephone Encounter (Signed)
Pt has confirmed her Port  Insertion appt for 04/06/22. [facility] in front of hospital. NPO after midnight. She was informed she will need a driver. Discussed chemo education appt on the 21st of December and what to expect.

## 2022-03-29 NOTE — Telephone Encounter (Signed)
Attempted all phone numbers to try and confirm Port placement appt with patient. No answer on any numbers.

## 2022-03-30 ENCOUNTER — Other Ambulatory Visit: Payer: Self-pay

## 2022-04-01 ENCOUNTER — Inpatient Hospital Stay: Payer: Medicare Other

## 2022-04-02 NOTE — Progress Notes (Signed)
Patient for IR Port Placement on Tues 04/06/2022, I called and LVM on the phone with pre-procedure instructions. The VM left made patient aware to be here at [place] at the new entrance, NPO after MN prior to procedure as well as driver post procedure/recovery/discharge. Called 03/29/2022 and 04/02/2022

## 2022-04-04 ENCOUNTER — Other Ambulatory Visit: Payer: Self-pay

## 2022-04-06 ENCOUNTER — Other Ambulatory Visit: Payer: Self-pay

## 2022-04-06 ENCOUNTER — Encounter: Payer: Self-pay | Admitting: Radiology

## 2022-04-06 ENCOUNTER — Ambulatory Visit
Admission: RE | Admit: 2022-04-06 | Discharge: 2022-04-06 | Disposition: A | Discharge status: Home / Self Care | Payer: Medicare Other | Source: Ambulatory Visit | Attending: Oncology | Admitting: Oncology

## 2022-04-06 VITALS — BP 141/68 | HR 79 | Temp 97.9°F | Resp 15 | Ht 65.0 in | Wt 114.0 lb

## 2022-04-06 DIAGNOSIS — C787 Secondary malignant neoplasm of liver and intrahepatic bile duct: Secondary | ICD-10-CM | POA: Diagnosis not present

## 2022-04-06 DIAGNOSIS — Z452 Encounter for adjustment and management of vascular access device: Secondary | ICD-10-CM | POA: Diagnosis not present

## 2022-04-06 DIAGNOSIS — C259 Malignant neoplasm of pancreas, unspecified: Secondary | ICD-10-CM | POA: Diagnosis not present

## 2022-04-06 DIAGNOSIS — C250 Malignant neoplasm of head of pancreas: Secondary | ICD-10-CM

## 2022-04-06 HISTORY — PX: IR IMAGING GUIDED PORT INSERTION: IMG5740

## 2022-04-06 MED ORDER — HEPARIN SOD (PORK) LOCK FLUSH 100 UNIT/ML IV SOLN
INTRAVENOUS | Status: AC
  Administered 2022-04-06: 500 [IU]
  Filled 2022-04-06: qty 5

## 2022-04-06 MED ORDER — MIDAZOLAM HCL 2 MG/2ML IJ SOLN
INTRAMUSCULAR | Status: AC | PRN
  Administered 2022-04-06 (×3): 1 mg via INTRAVENOUS

## 2022-04-06 MED ORDER — MIDAZOLAM HCL 2 MG/2ML IJ SOLN
INTRAMUSCULAR | Status: AC
  Filled 2022-04-06: qty 2

## 2022-04-06 MED ORDER — HEPARIN SOD (PORK) LOCK FLUSH 100 UNIT/ML IV SOLN
INTRAVENOUS | Status: AC
  Filled 2022-04-06: qty 5

## 2022-04-06 MED ORDER — FENTANYL CITRATE (PF) 100 MCG/2ML IJ SOLN
INTRAMUSCULAR | Status: AC | PRN
  Administered 2022-04-06 (×2): 25 ug via INTRAVENOUS

## 2022-04-06 MED ORDER — FENTANYL CITRATE (PF) 100 MCG/2ML IJ SOLN
INTRAMUSCULAR | Status: AC
  Filled 2022-04-06: qty 2

## 2022-04-06 MED ORDER — LIDOCAINE-EPINEPHRINE 1 %-1:100000 IJ SOLN
INTRAMUSCULAR | Status: AC
  Administered 2022-04-06: 20 mL
  Filled 2022-04-06: qty 1

## 2022-04-06 NOTE — H&P (Signed)
Vascular and Interventional Radiology  PRE PROCEDURE H&P  Assessment  Plan:   Ms. Meredith Wilcox is a 81 y.o. year old female who will undergo PORT CATHETER PLACEMENT in Interventional Radiology.  The procedure has been fully reviewed with the patient/patient's authorized representative. The risks, benefits and alternatives have been explained, and the patient/patient's authorized representative has consented to the procedure. -- The patient will accept blood products in an emergent situation. -- The patient does not have a Do Not Resuscitate order in effect.  HPI: Ms. Meredith Wilcox is a 81 y.o. year old female w PMHx significant for metastatic pancreatic CA after she presented w painless jaundice and acholic stools in March 1856. Imaging was suspicious for a pancreas head mass. ERCP/EUS w Bx was performed at OSH but FNA was reportedly revealing for adenocarcinoma. Pt with staging PET revealing liver metastasis. She was seen by [facility] and is recommended for chemotherapy. She presents today for port catheter placement.  Informed consent was obtained, witnessed and placed in the patient's chart.  Allergies:  Allergies  Allergen Reactions   Tussionex Pennkinetic Er [Hydrocod Poli-Chlorphe Poli Er] Other (See Comments)    Real dizzy headed even after a while still walking every direction but straight, patient stated she stopped taking it because of that.   Hydrocodone     Light headed and dizziness    Medications:  Current Outpatient Medications on File Prior to Encounter  Medication Sig Dispense Refill   amLODipine (NORVASC) 2.5 MG tablet Take 1 tablet (2.5 mg total) by mouth every evening. (Patient not taking: Reported on 03/26/2022) 90 tablet 0   lidocaine-prilocaine (EMLA) cream Apply to affected area once 30 g 3   ondansetron (ZOFRAN) 8 MG tablet Take 1 tablet (8 mg total) by mouth every 8 (eight) hours as needed for nausea or vomiting. 60 tablet 2   prochlorperazine (COMPAZINE) 10  MG tablet Take 1 tablet (10 mg total) by mouth every 6 (six) hours as needed for nausea or vomiting. 60 tablet 2   No current facility-administered medications on file prior to encounter.    PSH:  Past Surgical History:  Procedure Laterality Date   BREAST BIOPSY Left 10/11/2016   left breast stereo/ VASCULAR LESION   BREAST BIOPSY Left 12/02/2016   Procedure: NEEDLE LOCALIZATION;  Surgeon: [name], MD;  Location: [facility] ORS;  Service: General;  Laterality: Left;   BREAST EXCISIONAL BIOPSY Left 12/02/2016   left lumpectomy   BREAST LUMPECTOMY Left 12/02/2016   Procedure: EXCISION BREAST MASS;  Surgeon: [name], MD;  Location: [facility] ORS;  Service: General;  Laterality: Left;   DILATION AND CURETTAGE OF UTERUS     dnc     1983   ERCP N/A 07/07/2021   Procedure: ENDOSCOPIC RETROGRADE CHOLANGIOPANCREATOGRAPHY (ERCP);  Surgeon: [name], MD;  Location: [facility] ENDOSCOPY;  Service: Endoscopy;  Laterality: N/A;   ERCP N/A 10/01/2021   Procedure: ENDOSCOPIC RETROGRADE CHOLANGIOPANCREATOGRAPHY (ERCP);  Surgeon: [name], MD;  Location: [facility] ENDOSCOPY;  Service: Endoscopy;  Laterality: N/A;   fatty tumor removal Right    hand    PMH:  Past Medical History:  Diagnosis Date   Anemia    H/O DURING PREGNANCY   Arthritis    Chronic kidney disease    KIDNEY PROBLEMS AROUND AGE 77   Geographic tongue    GERD (gastroesophageal reflux disease)    H/O NO MEDS   Helicobacter pylori gastrointestinal tract infection    Hyperlipidemia    Incontinence  Indigestion    Prolapse of vaginal walls    Right sided sciatica    Tachycardia    Urethral prolapse     Brief Physical Examination: Vitals:   04/06/22 0953  BP: (!) 170/78  Pulse: 75  Resp: 14  Temp: 97.9 F (36.6 C)  SpO2: 98%   General: WD, WN female in NAD HEENT: Normocephalic, atraumatic Lungs: Respirations non-labored  ASA Grade: 3 Mallampati Class: 3   Michaelle Birks, MD Vascular and  Interventional Radiology Specialists [facility] Radiology   Pager. [phone] Clinic. [phone]  I spent a total of 25 Minutes of face-to-face time in clinical consultation, greater than 50% of which was counseling/coordinating care for Meredith Wilcox evaluation for port placement for chemotherapy.

## 2022-04-06 NOTE — Procedures (Signed)
Vascular and Interventional Radiology Procedure Note  Patient: Meredith Wilcox DOB: 02-05-1941 Medical Record Number: [id number] Note Date/Time: 04/06/22 12:56 PM   Performing Physician: Michaelle Birks, MD Assistant(s): None  Diagnosis: Pancreatic cancer  Procedure: PORT PLACEMENT  Anesthesia: Conscious Sedation Complications: None Estimated Blood Loss: Minimal  Findings:  Successful right-sided port placement, with the tip of the catheter in the proximal right atrium.  Plan: Catheter ready for use.  See detailed procedure note with images in PACS. The patient tolerated the procedure well without incident or complication and was returned to Recovery in stable condition.    Michaelle Birks, MD Vascular and Interventional Radiology Specialists [facility] Radiology   Pager. [place]

## 2022-04-08 ENCOUNTER — Inpatient Hospital Stay: Payer: Medicare Other

## 2022-04-08 ENCOUNTER — Encounter: Payer: Self-pay | Admitting: Oncology

## 2022-04-08 ENCOUNTER — Inpatient Hospital Stay (HOSPITAL_BASED_OUTPATIENT_CLINIC_OR_DEPARTMENT_OTHER): Payer: Medicare Other | Admitting: Oncology

## 2022-04-08 DIAGNOSIS — Z5111 Encounter for antineoplastic chemotherapy: Secondary | ICD-10-CM | POA: Diagnosis not present

## 2022-04-08 DIAGNOSIS — C250 Malignant neoplasm of head of pancreas: Secondary | ICD-10-CM | POA: Diagnosis not present

## 2022-04-08 DIAGNOSIS — R918 Other nonspecific abnormal finding of lung field: Secondary | ICD-10-CM | POA: Diagnosis not present

## 2022-04-08 DIAGNOSIS — D72819 Decreased white blood cell count, unspecified: Secondary | ICD-10-CM | POA: Diagnosis not present

## 2022-04-08 DIAGNOSIS — R7401 Elevation of levels of liver transaminase levels: Secondary | ICD-10-CM | POA: Diagnosis not present

## 2022-04-08 DIAGNOSIS — C787 Secondary malignant neoplasm of liver and intrahepatic bile duct: Secondary | ICD-10-CM | POA: Diagnosis not present

## 2022-04-08 LAB — CBC WITH DIFFERENTIAL/PLATELET
Abs Immature Granulocytes: 0.01 10*3/uL (ref 0.00–0.07)
Basophils Absolute: 0 10*3/uL (ref 0.0–0.1)
Basophils Relative: 1 %
Eosinophils Absolute: 0 10*3/uL (ref 0.0–0.5)
Eosinophils Relative: 1 %
HCT: 36.1 % (ref 36.0–46.0)
Hemoglobin: 11.9 g/dL — ABNORMAL LOW (ref 12.0–15.0)
Immature Granulocytes: 0 %
Lymphocytes Relative: 22 %
Lymphs Abs: 1.1 10*3/uL (ref 0.7–4.0)
MCH: 28.5 pg (ref 26.0–34.0)
MCHC: 33 g/dL (ref 30.0–36.0)
MCV: 86.6 fL (ref 80.0–100.0)
Monocytes Absolute: 0.5 10*3/uL (ref 0.1–1.0)
Monocytes Relative: 10 %
Neutro Abs: 3.5 10*3/uL (ref 1.7–7.7)
Neutrophils Relative %: 66 %
Platelets: 220 10*3/uL (ref 150–400)
RBC: 4.17 MIL/uL (ref 3.87–5.11)
RDW: 14 % (ref 11.5–15.5)
WBC: 5.2 10*3/uL (ref 4.0–10.5)
nRBC: 0 % (ref 0.0–0.2)

## 2022-04-08 LAB — COMPREHENSIVE METABOLIC PANEL
ALT: 20 U/L (ref 0–44)
AST: 32 U/L (ref 15–41)
Albumin: 3.8 g/dL (ref 3.5–5.0)
Alkaline Phosphatase: 200 U/L — ABNORMAL HIGH (ref 38–126)
Anion gap: 7 (ref 5–15)
BUN: 11 mg/dL (ref 8–23)
CO2: 27 mmol/L (ref 22–32)
Calcium: 9.3 mg/dL (ref 8.9–10.3)
Chloride: 101 mmol/L (ref 98–111)
Creatinine, Ser: 0.65 mg/dL (ref 0.44–1.00)
GFR, Estimated: >60 mL/min (ref >60)
Glucose, Bld: 116 mg/dL — ABNORMAL HIGH (ref 70–99)
Potassium: 3.7 mmol/L (ref 3.5–5.1)
Sodium: 135 mmol/L (ref 135–145)
Total Bilirubin: 0.7 mg/dL (ref 0.3–1.2)
Total Protein: 8.2 g/dL — ABNORMAL HIGH (ref 6.5–8.1)

## 2022-04-08 MED ORDER — PACLITAXEL PROTEIN-BOUND CHEMO INJECTION 100 MG
125.0000 mg/m2 | Freq: Once | INTRAVENOUS | Status: AC
  Administered 2022-04-08: 200 mg via INTRAVENOUS
  Filled 2022-04-08: qty 40

## 2022-04-08 MED ORDER — HEPARIN SOD (PORK) LOCK FLUSH 100 UNIT/ML IV SOLN
500.0000 [IU] | Freq: Once | INTRAVENOUS | Status: AC | PRN
  Administered 2022-04-08: 500 [IU]
  Filled 2022-04-08: qty 5

## 2022-04-08 MED ORDER — SODIUM CHLORIDE 0.9 % IV SOLN
1000.0000 mg/m2 | Freq: Once | INTRAVENOUS | Status: AC
  Administered 2022-04-08: 1559 mg via INTRAVENOUS
  Filled 2022-04-08: qty 41

## 2022-04-08 MED ORDER — SODIUM CHLORIDE 0.9 % IV SOLN
Freq: Once | INTRAVENOUS | Status: AC
  Filled 2022-04-08: qty 250

## 2022-04-08 MED ORDER — PROCHLORPERAZINE MALEATE 10 MG PO TABS
10.0000 mg | ORAL_TABLET | Freq: Once | ORAL | Status: AC
  Administered 2022-04-08: 10 mg via ORAL
  Filled 2022-04-08: qty 1

## 2022-04-08 NOTE — Progress Notes (Signed)
[place]  [phone] [phone]  ID: Meredith Wilcox OB: [birth year]  MR#: [id number]  KCM#:[id number]  Patient Care Team: [name], MD as PCP - General (Family Medicine) Birder Robson, MD as Consulting Physician (Ophthalmology) Clent Jacks, RN as Oncology Nurse Navigator  CHIEF COMPLAINT: Stage IV pancreatic adenocarcinoma.  INTERVAL HISTORY: Patient returns to clinic today for further evaluation and initiation of cycle 1, day 1 of gemcitabine and Abraxane.  She currently feels well.  She has no neurologic complaints.  She denies any recent fevers or illnesses. She has no chest pain, shortness of breath, cough, or hemoptysis.  She does not complain of abdominal pain today.  She has no nausea, vomiting, constipation, or diarrhea. She has no urinary complaints.  Patient is no further specific complaints today.  REVIEW OF SYSTEMS:   Review of Systems  Constitutional: Negative.  Negative for fever, malaise/fatigue and weight loss.  Respiratory: Negative.  Negative for cough, hemoptysis and shortness of breath.   Cardiovascular: Negative.  Negative for chest pain and leg swelling.  Gastrointestinal: Negative.  Negative for abdominal pain and heartburn.  Genitourinary: Negative.  Negative for dysuria.  Musculoskeletal: Negative.  Negative for back pain.  Skin: Negative.  Negative for rash.  Neurological: Negative.  Negative for dizziness, seizures, weakness and headaches.  Psychiatric/Behavioral: Negative.  The patient is not nervous/anxious.     As per HPI. Otherwise, a complete review of systems is negative.  PAST MEDICAL HISTORY: Past Medical History:  Diagnosis Date   Anemia    H/O DURING PREGNANCY   Arthritis    Chronic kidney disease    KIDNEY PROBLEMS AROUND AGE 35   Geographic tongue    GERD (gastroesophageal reflux disease)    H/O NO MEDS   Helicobacter pylori gastrointestinal tract infection    Hyperlipidemia     Incontinence    Indigestion    Prolapse of vaginal walls    Right sided sciatica    Tachycardia    Urethral prolapse     PAST SURGICAL HISTORY: Past Surgical History:  Procedure Laterality Date   BREAST BIOPSY Left 10/11/2016   left breast stereo/ VASCULAR LESION   BREAST BIOPSY Left 12/02/2016   Procedure: NEEDLE LOCALIZATION;  Surgeon: [name], MD;  Location: [facility] ORS;  Service: General;  Laterality: Left;   BREAST EXCISIONAL BIOPSY Left 12/02/2016   left lumpectomy   BREAST LUMPECTOMY Left 12/02/2016   Procedure: EXCISION BREAST MASS;  Surgeon: [name], MD;  Location: [facility] ORS;  Service: General;  Laterality: Left;   DILATION AND CURETTAGE OF UTERUS     dnc     1983   ERCP N/A 07/07/2021   Procedure: ENDOSCOPIC RETROGRADE CHOLANGIOPANCREATOGRAPHY (ERCP);  Surgeon: [name], MD;  Location: [facility] ENDOSCOPY;  Service: Endoscopy;  Laterality: N/A;   ERCP N/A 10/01/2021   Procedure: ENDOSCOPIC RETROGRADE CHOLANGIOPANCREATOGRAPHY (ERCP);  Surgeon: [name], MD;  Location: [facility] ENDOSCOPY;  Service: Endoscopy;  Laterality: N/A;   fatty tumor removal Right    hand   IR IMAGING GUIDED PORT INSERTION  04/06/2022    FAMILY HISTORY: Family History  Problem Relation Age of Onset   Diabetes Mother    Hypertension Mother    Cardiomyopathy Sister    Down syndrome Sister    Leukemia Brother    Alcohol abuse Son        youngest son   Asthma Son        youngest son   Rheum arthritis Sister  Drug abuse Child 23   Breast cancer Neg Hx    Kidney cancer Neg Hx    Bladder Cancer Neg Hx    Prostate cancer Neg Hx     ADVANCED DIRECTIVES (Y/N):  N  HEALTH MAINTENANCE: Social History   Tobacco Use   Smoking status: Never   Smokeless tobacco: Never   Tobacco comments:    smoking cessation materials not required  Vaping Use   Vaping Use: Never used  Substance Use Topics   Alcohol use: No    Alcohol/week: 0.0 standard drinks of alcohol   Drug  use: No     Colonoscopy:  PAP:  Bone density:  Lipid panel:  Allergies  Allergen Reactions   Tussionex Pennkinetic Er [Hydrocod Poli-Chlorphe Poli Er] Other (See Comments)    Real dizzy headed even after a while still walking every direction but straight, patient stated she stopped taking it because of that.   Hydrocodone     Light headed and dizziness    Current Outpatient Medications  Medication Sig Dispense Refill   lidocaine-prilocaine (EMLA) cream Apply to affected area once 30 g 3   ondansetron (ZOFRAN) 8 MG tablet Take 1 tablet (8 mg total) by mouth every 8 (eight) hours as needed for nausea or vomiting. 60 tablet 2   prochlorperazine (COMPAZINE) 10 MG tablet Take 1 tablet (10 mg total) by mouth every 6 (six) hours as needed for nausea or vomiting. 60 tablet 2   amLODipine (NORVASC) 2.5 MG tablet Take 1 tablet (2.5 mg total) by mouth every evening. (Patient not taking: Reported on 03/26/2022) 90 tablet 0   No current facility-administered medications for this visit.   Facility-Administered Medications Ordered in Other Visits  Medication Dose Route Frequency Provider Last Rate Last Admin   gemcitabine (GEMZAR) 1,559 mg in sodium chloride 0.9 % 250 mL chemo infusion  1,000 mg/m2 (Treatment Plan Recorded) Intravenous Once Lloyd Huger, MD       heparin lock flush 100 unit/mL  500 Units Intracatheter Once PRN Lloyd Huger, MD        OBJECTIVE: Vitals:   04/08/22 0847  BP: (!) 166/68  Pulse: 86  Resp: 16  Temp: (!) 97.2 F (36.2 C)  SpO2: 100%      Body mass index is 18.64 kg/m.    ECOG FS:0 - Asymptomatic  General: Well-developed, well-nourished, no acute distress. Eyes: Pink conjunctiva, anicteric sclera. HEENT: Normocephalic, moist mucous membranes. Lungs: No audible wheezing or coughing. Heart: Regular rate and rhythm. Abdomen: Soft, nontender, no obvious distention. Musculoskeletal: No edema, cyanosis, or clubbing. Neuro: Alert, answering all  questions appropriately. Cranial nerves grossly intact. Skin: No rashes or petechiae noted. Psych: Normal affect.  LAB RESULTS:  Lab Results  Component Value Date   NA 135 04/08/2022   K 3.7 04/08/2022   CL 101 04/08/2022   CO2 27 04/08/2022   GLUCOSE 116 (H) 04/08/2022   BUN 11 04/08/2022   CREATININE 0.65 04/08/2022   CALCIUM 9.3 04/08/2022   PROT 8.2 (H) 04/08/2022   ALBUMIN 3.8 04/08/2022   AST 32 04/08/2022   ALT 20 04/08/2022   ALKPHOS 200 (H) 04/08/2022   BILITOT 0.7 04/08/2022   GFRNONAA >60 04/08/2022   GFRAA 84 08/13/2020    Lab Results  Component Value Date   WBC 5.2 04/08/2022   NEUTROABS 3.5 04/08/2022   HGB 11.9 (L) 04/08/2022   HCT 36.1 04/08/2022   MCV 86.6 04/08/2022   PLT 220 04/08/2022     STUDIES:  IR IMAGING GUIDED PORT INSERTION  Result Date: 04/06/2022 INDICATION: Pancreatic cancer.  Chemotherapy induction. EXAM: IMPLANTED PORT A CATH PLACEMENT WITH ULTRASOUND AND FLUOROSCOPIC GUIDANCE MEDICATIONS: None; The antibiotic was administered within an appropriate time interval prior to skin puncture. ANESTHESIA/SEDATION: Moderate (conscious) sedation was employed during this procedure. A total of Versed 3 mg and Fentanyl 50 mcg was administered intravenously. Moderate Sedation Time: 11 minutes. The patient's level of consciousness and vital signs were monitored continuously by radiology nursing throughout the procedure under my direct supervision. FLUOROSCOPY TIME:  Fluoroscopic dose; 6.8 mGy COMPLICATIONS: None immediate. PROCEDURE: The procedure, risks, benefits, and alternatives were explained to the patient. Questions regarding the procedure were encouraged and answered. The patient understands and consents to the procedure. The RIGHT neck and chest were prepped with chlorhexidine in a sterile fashion, and a sterile drape was applied covering the operative field. Maximum barrier sterile technique with sterile gowns and gloves were used for the procedure. A  timeout was performed prior to the initiation of the procedure. Local anesthesia was provided with 1% lidocaine with epinephrine. After creating a small venotomy incision, a micropuncture kit was utilized to access the internal jugular vein under direct, real-time ultrasound guidance. Ultrasound image documentation was performed. The microwire was kinked to measure appropriate catheter length. A subcutaneous port pocket was then created along the upper chest wall utilizing a combination of sharp and blunt dissection. The pocket was irrigated with sterile saline. A single lumen Non-ISP power injectable port was chosen for placement. The 8 Fr catheter was tunneled from the port pocket site to the venotomy incision. The port was placed in the pocket. The external catheter was trimmed to appropriate length. At the venotomy, an 8 Fr peel-away sheath was placed over a guidewire under fluoroscopic guidance. The catheter was then placed through the sheath and the sheath was removed. Final catheter positioning was confirmed and documented with a fluoroscopic spot radiograph. The port was accessed with a Huber needle, aspirated and flushed with heparinized saline. The port pocket incision was closed with interrupted 3-0 Vicryl suture then Dermabond was applied, including at the venotomy incision. Dressings were placed. The patient tolerated the procedure well without immediate post procedural complication. IMPRESSION: Successful placement of a RIGHT internal jugular approach power injectable Port-A-Cath. The tip of the catheter is positioned at the superior cavo-atrial junction. The catheter is ready for immediate use. Michaelle Birks, MD Vascular and Interventional Radiology Specialists [facility] Radiology Electronically Signed   By: Michaelle Birks M.D.   On: 04/06/2022 16:56    ASSESSMENT: Stage IV pancreatic adenocarcinoma.  PLAN:    1.  Stage IV pancreatic adenocarcinoma.  Repeat CT scan on January 19, 2022 reviewed  independently with increasing size of suspicious pancreatic lesion now measuring 2.3 cm.  Her CA 19-9 is also increasing and now 170.  Repeat EUS at [facility] finally confirmed adenocarcinoma.  Patient was given a referral to surgery for consideration of resection despite her advanced age, unfortunately PET scan results from March 09, 2022 reviewed independently and report as above revealing metastatic disease in patient's liver.  After lengthy discussion with the patient, she wishes to pursue systemic chemotherapy using weekly gemcitabine plus Abraxane on days 1, 8, and 15 with day 22 off.  Patient has now had port placement.  Proceed with cycle 1, day 1 of treatment.  Return to clinic in 1 week for further evaluation and consideration of cycle 1, day 8.   2.  Hyperbilirubinemia: Resolved.  Patient now has  a metal stent in place. 3.  Transaminitis: Resolved. 4.  Alkaline phosphatase: Trending down, monitor. 5.  Leukopenia: Resolved.   6.  Weight loss: Monitor.  Consider dietary referral in the future.   Patient expressed understanding and was in agreement with this plan. She also understands that She can call clinic at any time with any questions, concerns, or complaints.    Cancer Staging  Malignant tumor of head of pancreas [facility]) Staging form: Exocrine Pancreas, AJCC 8th Edition - Clinical stage from 03/26/2022: Stage IV (cT2, cN0, cM1) - Signed by Lloyd Huger, MD on 03/26/2022 Total positive nodes: 0   Lloyd Huger, MD   04/08/2022 11:27 AM

## 2022-04-08 NOTE — Patient Instructions (Signed)
[facility] CANCER CTR AT [place]  Discharge Instructions: Thank you for choosing [facility] to provide your oncology and hematology care.  If you have a lab appointment with the [facility], please go directly to the [facility] and check in at the registration area.  Wear comfortable clothing and clothing appropriate for easy access to any Portacath or PICC line.   We strive to give you quality time with your provider. You may need to reschedule your appointment if you arrive late (15 or more minutes).  Arriving late affects you and other patients whose appointments are after yours.  Also, if you miss three or more appointments without notifying the office, you may be dismissed from the clinic at the provider's discretion.      For prescription refill requests, have your pharmacy contact our office and allow 72 hours for refills to be completed.    Today you received the following chemotherapy and/or immunotherapy agents Paclitaxel-protein bound and Gemcitabine.      To help prevent nausea and vomiting after your treatment, we encourage you to take your nausea medication as directed.  BELOW ARE SYMPTOMS THAT SHOULD BE REPORTED IMMEDIATELY: *FEVER GREATER THAN 100.4 F (38 C) OR HIGHER *CHILLS OR SWEATING *NAUSEA AND VOMITING THAT IS NOT CONTROLLED WITH YOUR NAUSEA MEDICATION *UNUSUAL SHORTNESS OF BREATH *UNUSUAL BRUISING OR BLEEDING *URINARY PROBLEMS (pain or burning when urinating, or frequent urination) *BOWEL PROBLEMS (unusual diarrhea, constipation, pain near the anus) TENDERNESS IN MOUTH AND THROAT WITH OR WITHOUT PRESENCE OF ULCERS (sore throat, sores in mouth, or a toothache) UNUSUAL RASH, SWELLING OR PAIN  UNUSUAL VAGINAL DISCHARGE OR ITCHING   Items with * indicate a potential emergency and should be followed up as soon as possible or go to the Emergency Department if any problems should occur.  Please show the CHEMOTHERAPY ALERT CARD or  IMMUNOTHERAPY ALERT CARD at check-in to the Emergency Department and triage nurse.  Should you have questions after your visit or need to cancel or reschedule your appointment, please contact [facility] CANCER [place] AT [place]  [phone] and follow the prompts.  Office hours are 8:00 a.m. to 4:30 p.m. Monday - Friday. Please note that voicemails left after 4:00 p.m. may not be returned until the following business day.  We are closed weekends and major holidays. You have access to a nurse at all times for urgent questions. Please call the main number to the clinic [phone] and follow the prompts.  For any non-urgent questions, you may also contact your provider using MyChart. We now offer e-Visits for anyone 74 and older to request care online for non-urgent symptoms. For details visit [email or web address].   Also download the MyChart app! Go to the app store, search "MyChart", open the app, select [facility], and log in with your MyChart username and password.

## 2022-04-09 ENCOUNTER — Telehealth: Payer: Self-pay

## 2022-04-09 NOTE — Telephone Encounter (Signed)
Telephone call to patient for follow up after receiving first infusion.   No answer but left message stating we were calling to check on them.  Encouraged patient to call for any questions or concerns.   

## 2022-04-10 LAB — CANCER ANTIGEN 19-9: CA 19-9: 22951 U/mL — ABNORMAL HIGH (ref 0–35)

## 2022-04-11 ENCOUNTER — Telehealth: Payer: Self-pay | Admitting: Oncology

## 2022-04-11 NOTE — Telephone Encounter (Signed)
Patient called to report "heart skipping beats" which makes her very uncomfortable, denies any chest pain, nausea, vomiting, shortness of breath. She feels " weak and lethargic"  after her first chemotherapy on Thursday..  I advise patient to go to ER for evaluation.  Patient then mentioned that she also has "burping"sensation and she feels better after "burping". She does not want to go to ER tonight and I recommend patient to get cardiology evaluation outpatient.  Cc Dr.Finnegan.

## 2022-04-13 ENCOUNTER — Other Ambulatory Visit: Payer: Self-pay

## 2022-04-14 ENCOUNTER — Other Ambulatory Visit: Payer: Self-pay

## 2022-04-15 ENCOUNTER — Inpatient Hospital Stay: Payer: Medicare Other | Attending: Oncology

## 2022-04-15 ENCOUNTER — Inpatient Hospital Stay (HOSPITAL_BASED_OUTPATIENT_CLINIC_OR_DEPARTMENT_OTHER): Payer: Medicare Other | Admitting: Oncology

## 2022-04-15 ENCOUNTER — Inpatient Hospital Stay: Payer: Medicare Other

## 2022-04-15 ENCOUNTER — Encounter: Payer: Self-pay | Admitting: Oncology

## 2022-04-15 DIAGNOSIS — D696 Thrombocytopenia, unspecified: Secondary | ICD-10-CM | POA: Insufficient documentation

## 2022-04-15 DIAGNOSIS — R748 Abnormal levels of other serum enzymes: Secondary | ICD-10-CM | POA: Diagnosis not present

## 2022-04-15 DIAGNOSIS — C250 Malignant neoplasm of head of pancreas: Secondary | ICD-10-CM

## 2022-04-15 DIAGNOSIS — E871 Hypo-osmolality and hyponatremia: Secondary | ICD-10-CM | POA: Insufficient documentation

## 2022-04-15 DIAGNOSIS — R634 Abnormal weight loss: Secondary | ICD-10-CM | POA: Insufficient documentation

## 2022-04-15 DIAGNOSIS — D72819 Decreased white blood cell count, unspecified: Secondary | ICD-10-CM | POA: Insufficient documentation

## 2022-04-15 DIAGNOSIS — D75839 Thrombocytosis, unspecified: Secondary | ICD-10-CM | POA: Insufficient documentation

## 2022-04-15 DIAGNOSIS — D649 Anemia, unspecified: Secondary | ICD-10-CM | POA: Diagnosis not present

## 2022-04-15 DIAGNOSIS — Z5111 Encounter for antineoplastic chemotherapy: Secondary | ICD-10-CM | POA: Insufficient documentation

## 2022-04-15 DIAGNOSIS — Z452 Encounter for adjustment and management of vascular access device: Secondary | ICD-10-CM | POA: Insufficient documentation

## 2022-04-15 LAB — COMPREHENSIVE METABOLIC PANEL
ALT: 23 U/L (ref 0–44)
AST: 32 U/L (ref 15–41)
Albumin: 3.6 g/dL (ref 3.5–5.0)
Alkaline Phosphatase: 196 U/L — ABNORMAL HIGH (ref 38–126)
Anion gap: 7 (ref 5–15)
BUN: 7 mg/dL — ABNORMAL LOW (ref 8–23)
CO2: 26 mmol/L (ref 22–32)
Calcium: 8.8 mg/dL — ABNORMAL LOW (ref 8.9–10.3)
Chloride: 98 mmol/L (ref 98–111)
Creatinine, Ser: 0.62 mg/dL (ref 0.44–1.00)
GFR, Estimated: >60 mL/min (ref >60)
Glucose, Bld: 189 mg/dL — ABNORMAL HIGH (ref 70–99)
Potassium: 4 mmol/L (ref 3.5–5.1)
Sodium: 131 mmol/L — ABNORMAL LOW (ref 135–145)
Total Bilirubin: 0.6 mg/dL (ref 0.3–1.2)
Total Protein: 8 g/dL (ref 6.5–8.1)

## 2022-04-15 LAB — CBC WITH DIFFERENTIAL/PLATELET
Abs Immature Granulocytes: 0.01 10*3/uL (ref 0.00–0.07)
Basophils Absolute: 0 10*3/uL (ref 0.0–0.1)
Basophils Relative: 0 %
Eosinophils Absolute: 0 10*3/uL (ref 0.0–0.5)
Eosinophils Relative: 1 %
HCT: 31.9 % — ABNORMAL LOW (ref 36.0–46.0)
Hemoglobin: 10.7 g/dL — ABNORMAL LOW (ref 12.0–15.0)
Immature Granulocytes: 0 %
Lymphocytes Relative: 33 %
Lymphs Abs: 0.9 10*3/uL (ref 0.7–4.0)
MCH: 28.9 pg (ref 26.0–34.0)
MCHC: 33.5 g/dL (ref 30.0–36.0)
MCV: 86.2 fL (ref 80.0–100.0)
Monocytes Absolute: 0.2 10*3/uL (ref 0.1–1.0)
Monocytes Relative: 7 %
Neutro Abs: 1.7 10*3/uL (ref 1.7–7.7)
Neutrophils Relative %: 59 %
Platelets: 141 10*3/uL — ABNORMAL LOW (ref 150–400)
RBC: 3.7 MIL/uL — ABNORMAL LOW (ref 3.87–5.11)
RDW: 13.5 % (ref 11.5–15.5)
WBC: 2.9 10*3/uL — ABNORMAL LOW (ref 4.0–10.5)
nRBC: 0 % (ref 0.0–0.2)

## 2022-04-15 MED ORDER — HEPARIN SOD (PORK) LOCK FLUSH 100 UNIT/ML IV SOLN
500.0000 [IU] | Freq: Once | INTRAVENOUS | Status: AC | PRN
  Administered 2022-04-15: 500 [IU]
  Filled 2022-04-15: qty 5

## 2022-04-15 MED ORDER — SODIUM CHLORIDE 0.9% FLUSH
10.0000 mL | INTRAVENOUS | Status: DC | PRN
  Administered 2022-04-15: 10 mL
  Filled 2022-04-15: qty 10

## 2022-04-15 MED ORDER — SODIUM CHLORIDE 0.9 % IV SOLN
Freq: Once | INTRAVENOUS | Status: AC
  Filled 2022-04-15: qty 250

## 2022-04-15 MED ORDER — PROCHLORPERAZINE MALEATE 10 MG PO TABS
10.0000 mg | ORAL_TABLET | Freq: Once | ORAL | Status: AC
  Administered 2022-04-15: 10 mg via ORAL
  Filled 2022-04-15: qty 1

## 2022-04-15 MED ORDER — SODIUM CHLORIDE 0.9 % IV SOLN
1000.0000 mg/m2 | Freq: Once | INTRAVENOUS | Status: AC
  Administered 2022-04-15: 1559 mg via INTRAVENOUS
  Filled 2022-04-15: qty 41

## 2022-04-15 MED ORDER — PACLITAXEL PROTEIN-BOUND CHEMO INJECTION 100 MG
125.0000 mg/m2 | Freq: Once | INTRAVENOUS | Status: AC
  Administered 2022-04-15: 200 mg via INTRAVENOUS
  Filled 2022-04-15: qty 40

## 2022-04-15 NOTE — Progress Notes (Signed)
Mentioned some tingling in feet. Already had the tingly feeling in right foot prior but has noticed it more since tx. Was told to mention to doctor.

## 2022-04-15 NOTE — Progress Notes (Signed)
[name]  [phone] [phone]  ID: Meredith Wilcox OB: [birth year]  MR#: [id number]  VQM#:[id number]  Patient Care Team: [name], MD as PCP - General (Family Medicine) Birder Robson, MD as Consulting Physician (Ophthalmology) Clent Jacks, RN as Oncology Nurse Navigator Grayland Ormond, Kathlene November, MD as Consulting Physician (Oncology)  CHIEF COMPLAINT: Stage IV pancreatic adenocarcinoma.  INTERVAL HISTORY: Patient returns to clinic today for further evaluation and consideration of cycle 1, day 8 of gemcitabine and Abraxane.  She tolerated her first treatment well without significant side effects.  She currently feels well and is asymptomatic.  She has no neurologic complaints.  She denies any recent fevers or illnesses. She has no chest pain, shortness of breath, cough, or hemoptysis.  She does not complain of abdominal pain today.  She has no nausea, vomiting, constipation, or diarrhea. She has no urinary complaints.  Patient offers no specific complaints today.  REVIEW OF SYSTEMS:   Review of Systems  Constitutional: Negative.  Negative for fever, malaise/fatigue and weight loss.  Respiratory: Negative.  Negative for cough, hemoptysis and shortness of breath.   Cardiovascular: Negative.  Negative for chest pain and leg swelling.  Gastrointestinal: Negative.  Negative for abdominal pain and heartburn.  Genitourinary: Negative.  Negative for dysuria.  Musculoskeletal: Negative.  Negative for back pain.  Skin: Negative.  Negative for rash.  Neurological: Negative.  Negative for dizziness, seizures, weakness and headaches.  Psychiatric/Behavioral: Negative.  The patient is not nervous/anxious.     As per HPI. Otherwise, a complete review of systems is negative.  PAST MEDICAL HISTORY: Past Medical History:  Diagnosis Date   Anemia    H/O DURING PREGNANCY   Arthritis    Chronic kidney disease    KIDNEY PROBLEMS AROUND AGE 61    Geographic tongue    GERD (gastroesophageal reflux disease)    H/O NO MEDS   Helicobacter pylori gastrointestinal tract infection    Hyperlipidemia    Incontinence    Indigestion    Prolapse of vaginal walls    Right sided sciatica    Tachycardia    Urethral prolapse     PAST SURGICAL HISTORY: Past Surgical History:  Procedure Laterality Date   BREAST BIOPSY Left 10/11/2016   left breast stereo/ VASCULAR LESION   BREAST BIOPSY Left 12/02/2016   Procedure: NEEDLE LOCALIZATION;  Surgeon: [name], MD;  Location: [facility] ORS;  Service: General;  Laterality: Left;   BREAST EXCISIONAL BIOPSY Left 12/02/2016   left lumpectomy   BREAST LUMPECTOMY Left 12/02/2016   Procedure: EXCISION BREAST MASS;  Surgeon: [name], MD;  Location: [facility] ORS;  Service: General;  Laterality: Left;   DILATION AND CURETTAGE OF UTERUS     dnc     1983   ERCP N/A 07/07/2021   Procedure: ENDOSCOPIC RETROGRADE CHOLANGIOPANCREATOGRAPHY (ERCP);  Surgeon: [name], MD;  Location: [facility] ENDOSCOPY;  Service: Endoscopy;  Laterality: N/A;   ERCP N/A 10/01/2021   Procedure: ENDOSCOPIC RETROGRADE CHOLANGIOPANCREATOGRAPHY (ERCP);  Surgeon: [name], MD;  Location: [facility] ENDOSCOPY;  Service: Endoscopy;  Laterality: N/A;   fatty tumor removal Right    hand   IR IMAGING GUIDED PORT INSERTION  04/06/2022    FAMILY HISTORY: Family History  Problem Relation Age of Onset   Diabetes Mother    Hypertension Mother    Cardiomyopathy Sister    Down syndrome Sister    Leukemia Brother    Alcohol abuse Son        youngest  son   Asthma Son        youngest son   Rheum arthritis Sister    Drug abuse Child 68   Breast cancer Neg Hx    Kidney cancer Neg Hx    Bladder Cancer Neg Hx    Prostate cancer Neg Hx     ADVANCED DIRECTIVES (Y/N):  N  HEALTH MAINTENANCE: Social History   Tobacco Use   Smoking status: Never   Smokeless tobacco: Never   Tobacco comments:    smoking cessation  materials not required  Vaping Use   Vaping Use: Never used  Substance Use Topics   Alcohol use: No    Alcohol/week: 0.0 standard drinks of alcohol   Drug use: No     Colonoscopy:  PAP:  Bone density:  Lipid panel:  Allergies  Allergen Reactions   Tussionex Pennkinetic Er [Hydrocod Poli-Chlorphe Poli Er] Other (See Comments)    Real dizzy headed even after a while still walking every direction but straight, patient stated she stopped taking it because of that.   Hydrocodone     Light headed and dizziness    Current Outpatient Medications  Medication Sig Dispense Refill   lidocaine-prilocaine (EMLA) cream Apply to affected area once 30 g 3   ondansetron (ZOFRAN) 8 MG tablet Take 1 tablet (8 mg total) by mouth every 8 (eight) hours as needed for nausea or vomiting. 60 tablet 2   prochlorperazine (COMPAZINE) 10 MG tablet Take 1 tablet (10 mg total) by mouth every 6 (six) hours as needed for nausea or vomiting. 60 tablet 2   amLODipine (NORVASC) 2.5 MG tablet Take 1 tablet (2.5 mg total) by mouth every evening. (Patient not taking: Reported on 03/26/2022) 90 tablet 0   No current facility-administered medications for this visit.    OBJECTIVE: Vitals:   04/15/22 0926  BP: (!) 147/68  Pulse: 81  Resp: 16  Temp: (!) 97.2 F (36.2 C)  SpO2: 100%      Body mass index is 19.05 kg/m.    ECOG FS:0 - Asymptomatic  General: Well-developed, well-nourished, no acute distress. Eyes: Pink conjunctiva, anicteric sclera. HEENT: Normocephalic, moist mucous membranes. Lungs: No audible wheezing or coughing. Heart: Regular rate and rhythm. Abdomen: Soft, nontender, no obvious distention. Musculoskeletal: No edema, cyanosis, or clubbing. Neuro: Alert, answering all questions appropriately. Cranial nerves grossly intact. Skin: No rashes or petechiae noted. Psych: Normal affect.  LAB RESULTS:  Lab Results  Component Value Date   NA 131 (L) 04/15/2022   K 4.0 04/15/2022   CL 98  04/15/2022   CO2 26 04/15/2022   GLUCOSE 189 (H) 04/15/2022   BUN 7 (L) 04/15/2022   CREATININE 0.62 04/15/2022   CALCIUM 8.8 (L) 04/15/2022   PROT 8.0 04/15/2022   ALBUMIN 3.6 04/15/2022   AST 32 04/15/2022   ALT 23 04/15/2022   ALKPHOS 196 (H) 04/15/2022   BILITOT 0.6 04/15/2022   GFRNONAA >60 04/15/2022   GFRAA 84 08/13/2020    Lab Results  Component Value Date   WBC 2.9 (L) 04/15/2022   NEUTROABS 1.7 04/15/2022   HGB 10.7 (L) 04/15/2022   HCT 31.9 (L) 04/15/2022   MCV 86.2 04/15/2022   PLT 141 (L) 04/15/2022     STUDIES: IR IMAGING GUIDED PORT INSERTION  Result Date: 04/06/2022 INDICATION: Pancreatic cancer.  Chemotherapy induction. EXAM: IMPLANTED PORT A CATH PLACEMENT WITH ULTRASOUND AND FLUOROSCOPIC GUIDANCE MEDICATIONS: None; The antibiotic was administered within an appropriate time interval prior to skin puncture. ANESTHESIA/SEDATION: Moderate (  conscious) sedation was employed during this procedure. A total of Versed 3 mg and Fentanyl 50 mcg was administered intravenously. Moderate Sedation Time: 11 minutes. The patient's level of consciousness and vital signs were monitored continuously by radiology nursing throughout the procedure under my direct supervision. FLUOROSCOPY TIME:  Fluoroscopic dose; 6.8 mGy COMPLICATIONS: None immediate. PROCEDURE: The procedure, risks, benefits, and alternatives were explained to the patient. Questions regarding the procedure were encouraged and answered. The patient understands and consents to the procedure. The RIGHT neck and chest were prepped with chlorhexidine in a sterile fashion, and a sterile drape was applied covering the operative field. Maximum barrier sterile technique with sterile gowns and gloves were used for the procedure. A timeout was performed prior to the initiation of the procedure. Local anesthesia was provided with 1% lidocaine with epinephrine. After creating a small venotomy incision, a micropuncture kit was utilized  to access the internal jugular vein under direct, real-time ultrasound guidance. Ultrasound image documentation was performed. The microwire was kinked to measure appropriate catheter length. A subcutaneous port pocket was then created along the upper chest wall utilizing a combination of sharp and blunt dissection. The pocket was irrigated with sterile saline. A single lumen Non-ISP power injectable port was chosen for placement. The 8 Fr catheter was tunneled from the port pocket site to the venotomy incision. The port was placed in the pocket. The external catheter was trimmed to appropriate length. At the venotomy, an 8 Fr peel-away sheath was placed over a guidewire under fluoroscopic guidance. The catheter was then placed through the sheath and the sheath was removed. Final catheter positioning was confirmed and documented with a fluoroscopic spot radiograph. The port was accessed with a Huber needle, aspirated and flushed with heparinized saline. The port pocket incision was closed with interrupted 3-0 Vicryl suture then Dermabond was applied, including at the venotomy incision. Dressings were placed. The patient tolerated the procedure well without immediate post procedural complication. IMPRESSION: Successful placement of a RIGHT internal jugular approach power injectable Port-A-Cath. The tip of the catheter is positioned at the superior cavo-atrial junction. The catheter is ready for immediate use. Michaelle Birks, MD [facility] Radiology Electronically Signed   By: Michaelle Birks M.D.   On: 04/06/2022 16:56    ASSESSMENT: Stage IV pancreatic adenocarcinoma.  PLAN:    1.  Stage IV pancreatic adenocarcinoma.  Repeat CT scan on January 19, 2022 reviewed independently with increasing size of suspicious pancreatic lesion now measuring 2.3 cm. Repeat EUS at [facility] finally confirmed adenocarcinoma.  Patient was given a referral to surgery for  consideration of resection despite her advanced age, unfortunately PET scan results from March 09, 2022 reviewed independently and report as above revealing metastatic disease in patient's liver.  After lengthy discussion with the patient, she wishes to pursue systemic chemotherapy using weekly gemcitabine plus Abraxane on days 1, 8, and 15 with day 22 off. Her pretreatment CA 19-9 increased significantly to 22,951.  Patient has now had port placement.  Proceed with cycle 1, day 8 of treatment today.  Return to clinic in 1 week for further evaluation and consideration of cycle 1, day 15.   2.  Hyperbilirubinemia: Resolved.  Patient now has a metal stent in place. 3.  Transaminitis: Resolved. 4.  Alkaline phosphatase: Continues to trend down. 5.  Leukopenia: Mild.  Patient's total white blood cell count is 2.9 today.  Monitor. 6.  Anemia: Hemoglobin has trended down slightly to 10.7. 7.  Thrombocytopenia: Mild, monitor. 8.  Hyponatremia: Mild.  Patient's sodium is 131 today. 9.  Weight loss: Monitor.  Consider dietary referral in the future.   Patient expressed understanding and was in agreement with this plan. She also understands that She can call clinic at any time with any questions, concerns, or complaints.    Cancer Staging  Malignant tumor of head of pancreas [facility]) Staging form: Exocrine Pancreas, AJCC 8th Edition - Clinical stage from 03/26/2022: Stage IV (cT2, cN0, cM1) - Signed by Lloyd Huger, MD on 03/26/2022 Total positive nodes: 0   Lloyd Huger, MD   04/15/2022 9:58 AM

## 2022-04-15 NOTE — Patient Instructions (Signed)
[facility] CANCER CTR AT [place] [place]  Discharge Instructions: Thank you for choosing [facility] to provide your oncology and hematology care.  If you have a lab appointment with the [facility], please go directly to the [facility] and check in at the registration area.  Wear comfortable clothing and clothing appropriate for easy access to any Portacath or PICC line.   We strive to give you quality time with your provider. You may need to reschedule your appointment if you arrive late (15 or more minutes).  Arriving late affects you and other patients whose appointments are after yours.  Also, if you miss three or more appointments without notifying the office, you may be dismissed from the clinic at the provider's discretion.      For prescription refill requests, have your pharmacy contact our office and allow 72 hours for refills to be completed.    Today you received the following chemotherapy and/or immunotherapy agents Paclitaxel-protein bound and Gemcitabine.      To help prevent nausea and vomiting after your treatment, we encourage you to take your nausea medication as directed.  BELOW ARE SYMPTOMS THAT SHOULD BE REPORTED IMMEDIATELY: *FEVER GREATER THAN 100.4 F (38 C) OR HIGHER *CHILLS OR SWEATING *NAUSEA AND VOMITING THAT IS NOT CONTROLLED WITH YOUR NAUSEA MEDICATION *UNUSUAL SHORTNESS OF BREATH *UNUSUAL BRUISING OR BLEEDING *URINARY PROBLEMS (pain or burning when urinating, or frequent urination) *BOWEL PROBLEMS (unusual diarrhea, constipation, pain near the anus) TENDERNESS IN MOUTH AND THROAT WITH OR WITHOUT PRESENCE OF ULCERS (sore throat, sores in mouth, or a toothache) UNUSUAL RASH, SWELLING OR PAIN  UNUSUAL VAGINAL DISCHARGE OR ITCHING   Items with * indicate a potential emergency and should be followed up as soon as possible or go to the Emergency Department if any problems should occur.  Please show the CHEMOTHERAPY ALERT CARD or  IMMUNOTHERAPY ALERT CARD at check-in to the Emergency Department and triage nurse.  Should you have questions after your visit or need to cancel or reschedule your appointment, please contact [facility] CANCER [place] AT [place]  [phone] and follow the prompts.  Office hours are 8:00 a.m. to 4:30 p.m. Monday - Friday. Please note that voicemails left after 4:00 p.m. may not be returned until the following business day.  We are closed weekends and major holidays. You have access to a nurse at all times for urgent questions. Please call the main number to the clinic [phone] and follow the prompts.  For any non-urgent questions, you may also contact your provider using MyChart. We now offer e-Visits for anyone 88 and older to request care online for non-urgent symptoms. For details visit [email or web address].   Also download the MyChart app! Go to the app store, search "MyChart", open the app, select [facility], and log in with your MyChart username and password.

## 2022-04-22 ENCOUNTER — Inpatient Hospital Stay: Payer: Medicare Other

## 2022-04-22 ENCOUNTER — Inpatient Hospital Stay (HOSPITAL_BASED_OUTPATIENT_CLINIC_OR_DEPARTMENT_OTHER): Payer: Medicare Other | Admitting: Oncology

## 2022-04-22 ENCOUNTER — Encounter: Payer: Self-pay | Admitting: Oncology

## 2022-04-22 VITALS — BP 155/68 | HR 80 | Temp 96.8°F | Resp 16 | Ht 65.0 in | Wt 111.0 lb

## 2022-04-22 DIAGNOSIS — C250 Malignant neoplasm of head of pancreas: Secondary | ICD-10-CM

## 2022-04-22 DIAGNOSIS — Z5111 Encounter for antineoplastic chemotherapy: Secondary | ICD-10-CM | POA: Diagnosis not present

## 2022-04-22 DIAGNOSIS — D649 Anemia, unspecified: Secondary | ICD-10-CM | POA: Diagnosis not present

## 2022-04-22 DIAGNOSIS — D72819 Decreased white blood cell count, unspecified: Secondary | ICD-10-CM | POA: Diagnosis not present

## 2022-04-22 DIAGNOSIS — E871 Hypo-osmolality and hyponatremia: Secondary | ICD-10-CM | POA: Diagnosis not present

## 2022-04-22 DIAGNOSIS — D696 Thrombocytopenia, unspecified: Secondary | ICD-10-CM | POA: Diagnosis not present

## 2022-04-22 LAB — CBC WITH DIFFERENTIAL/PLATELET
Abs Immature Granulocytes: 0 10*3/uL (ref 0.00–0.07)
Basophils Absolute: 0 10*3/uL (ref 0.0–0.1)
Basophils Relative: 1 %
Eosinophils Absolute: 0 10*3/uL (ref 0.0–0.5)
Eosinophils Relative: 1 %
HCT: 30.1 % — ABNORMAL LOW (ref 36.0–46.0)
Hemoglobin: 10.2 g/dL — ABNORMAL LOW (ref 12.0–15.0)
Immature Granulocytes: 0 %
Lymphocytes Relative: 31 %
Lymphs Abs: 1 10*3/uL (ref 0.7–4.0)
MCH: 29.1 pg (ref 26.0–34.0)
MCHC: 33.9 g/dL (ref 30.0–36.0)
MCV: 86 fL (ref 80.0–100.0)
Monocytes Absolute: 0.2 10*3/uL (ref 0.1–1.0)
Monocytes Relative: 6 %
Neutro Abs: 1.9 10*3/uL (ref 1.7–7.7)
Neutrophils Relative %: 61 %
Platelets: 112 10*3/uL — ABNORMAL LOW (ref 150–400)
RBC: 3.5 MIL/uL — ABNORMAL LOW (ref 3.87–5.11)
RDW: 13.6 % (ref 11.5–15.5)
WBC: 3.1 10*3/uL — ABNORMAL LOW (ref 4.0–10.5)
nRBC: 0 % (ref 0.0–0.2)

## 2022-04-22 LAB — COMPREHENSIVE METABOLIC PANEL
ALT: 22 U/L (ref 0–44)
AST: 30 U/L (ref 15–41)
Albumin: 3.5 g/dL (ref 3.5–5.0)
Alkaline Phosphatase: 219 U/L — ABNORMAL HIGH (ref 38–126)
Anion gap: 7 (ref 5–15)
BUN: 7 mg/dL — ABNORMAL LOW (ref 8–23)
CO2: 26 mmol/L (ref 22–32)
Calcium: 9 mg/dL (ref 8.9–10.3)
Chloride: 96 mmol/L — ABNORMAL LOW (ref 98–111)
Creatinine, Ser: 0.62 mg/dL (ref 0.44–1.00)
GFR, Estimated: >60 mL/min (ref >60)
Glucose, Bld: 132 mg/dL — ABNORMAL HIGH (ref 70–99)
Potassium: 3.7 mmol/L (ref 3.5–5.1)
Sodium: 129 mmol/L — ABNORMAL LOW (ref 135–145)
Total Bilirubin: 0.5 mg/dL (ref 0.3–1.2)
Total Protein: 7.7 g/dL (ref 6.5–8.1)

## 2022-04-22 MED ORDER — HEPARIN SOD (PORK) LOCK FLUSH 100 UNIT/ML IV SOLN
500.0000 [IU] | Freq: Once | INTRAVENOUS | Status: AC
  Administered 2022-04-22: 500 [IU] via INTRAVENOUS
  Filled 2022-04-22: qty 5

## 2022-04-22 NOTE — Progress Notes (Signed)
[place]  Telephone:([phone]) [phone] Fax:(336) [phone]  ID: Meredith Wilcox OB: 1940/08/22  MR#: [id number]  UKG#:[id number]  Patient Care Team: [name], MD as PCP - General (Family Medicine) Birder Robson, MD as Consulting Physician (Ophthalmology) Clent Jacks, RN as Oncology Nurse Navigator Grayland Ormond, Kathlene November, MD as Consulting Physician (Oncology)  CHIEF COMPLAINT: Stage IV pancreatic adenocarcinoma.  INTERVAL HISTORY: Patient returns to clinic today for further evaluation and consideration of cycle 1, day 15 of gemcitabine and Abraxane.  She felt at her baseline throughout the week, but then this morning woke up with chills and increased congestion.  She denies any fevers.  She has no neurologic complaints. She has no chest pain, shortness of breath, cough, or hemoptysis.  She does not complain of abdominal pain today.  She has no nausea, vomiting, constipation, or diarrhea. She has no urinary complaints.  Patient offers no further specific complaints today.  REVIEW OF SYSTEMS:   Review of Systems  Constitutional:  Positive for chills and malaise/fatigue. Negative for fever and weight loss.  HENT:  Positive for congestion.   Respiratory: Negative.  Negative for cough, hemoptysis and shortness of breath.   Cardiovascular: Negative.  Negative for chest pain and leg swelling.  Gastrointestinal: Negative.  Negative for abdominal pain and heartburn.  Genitourinary: Negative.  Negative for dysuria.  Musculoskeletal: Negative.  Negative for back pain.  Skin: Negative.  Negative for rash.  Neurological: Negative.  Negative for dizziness, seizures, weakness and headaches.  Psychiatric/Behavioral: Negative.  The patient is not nervous/anxious.     As per HPI. Otherwise, a complete review of systems is negative.  PAST MEDICAL HISTORY: Past Medical History:  Diagnosis Date   Anemia    H/O DURING PREGNANCY   Arthritis    Chronic kidney disease    KIDNEY  PROBLEMS AROUND AGE 86   Geographic tongue    GERD (gastroesophageal reflux disease)    H/O NO MEDS   Helicobacter pylori gastrointestinal tract infection    Hyperlipidemia    Incontinence    Indigestion    Prolapse of vaginal walls    Right sided sciatica    Tachycardia    Urethral prolapse     PAST SURGICAL HISTORY: Past Surgical History:  Procedure Laterality Date   BREAST BIOPSY Left 10/11/2016   left breast stereo/ VASCULAR LESION   BREAST BIOPSY Left 12/02/2016   Procedure: NEEDLE LOCALIZATION;  Surgeon: [name], MD;  Location: [facility] ORS;  Service: General;  Laterality: Left;   BREAST EXCISIONAL BIOPSY Left 12/02/2016   left lumpectomy   BREAST LUMPECTOMY Left 12/02/2016   Procedure: EXCISION BREAST MASS;  Surgeon: [name], MD;  Location: [facility] ORS;  Service: General;  Laterality: Left;   DILATION AND CURETTAGE OF UTERUS     dnc     1983   ERCP N/A 07/07/2021   Procedure: ENDOSCOPIC RETROGRADE CHOLANGIOPANCREATOGRAPHY (ERCP);  Surgeon: [name], MD;  Location: [facility] ENDOSCOPY;  Service: Endoscopy;  Laterality: N/A;   ERCP N/A 10/01/2021   Procedure: ENDOSCOPIC RETROGRADE CHOLANGIOPANCREATOGRAPHY (ERCP);  Surgeon: [name], MD;  Location: [facility] ENDOSCOPY;  Service: Endoscopy;  Laterality: N/A;   fatty tumor removal Right    hand   IR IMAGING GUIDED PORT INSERTION  04/06/2022    FAMILY HISTORY: Family History  Problem Relation Age of Onset   Diabetes Mother    Hypertension Mother    Cardiomyopathy Sister    Down syndrome Sister    Leukemia Brother    Alcohol abuse  Son        youngest son   Asthma Son        youngest son   Rheum arthritis Sister    Drug abuse Child 73   Breast cancer Neg Hx    Kidney cancer Neg Hx    Bladder Cancer Neg Hx    Prostate cancer Neg Hx     ADVANCED DIRECTIVES (Y/N):  N  HEALTH MAINTENANCE: Social History   Tobacco Use   Smoking status: Never   Smokeless tobacco: Never   Tobacco comments:     smoking cessation materials not required  Vaping Use   Vaping Use: Never used  Substance Use Topics   Alcohol use: No    Alcohol/week: 0.0 standard drinks of alcohol   Drug use: No     Colonoscopy:  PAP:  Bone density:  Lipid panel:  Allergies  Allergen Reactions   Tussionex Pennkinetic Er [Hydrocod Poli-Chlorphe Poli Er] Other (See Comments)    Real dizzy headed even after a while still walking every direction but straight, patient stated she stopped taking it because of that.   Hydrocodone     Light headed and dizziness    Current Outpatient Medications  Medication Sig Dispense Refill   lidocaine-prilocaine (EMLA) cream Apply to affected area once 30 g 3   ondansetron (ZOFRAN) 8 MG tablet Take 1 tablet (8 mg total) by mouth every 8 (eight) hours as needed for nausea or vomiting. 60 tablet 2   prochlorperazine (COMPAZINE) 10 MG tablet Take 1 tablet (10 mg total) by mouth every 6 (six) hours as needed for nausea or vomiting. 60 tablet 2   amLODipine (NORVASC) 2.5 MG tablet Take 1 tablet (2.5 mg total) by mouth every evening. (Patient not taking: Reported on 03/26/2022) 90 tablet 0   No current facility-administered medications for this visit.    OBJECTIVE: Vitals:   04/22/22 0851  BP: (!) 155/68  Pulse: 80  Resp: 16  Temp: (!) 96.8 F (36 C)  SpO2: 100%      Body mass index is 18.47 kg/m.    ECOG FS:0 - Asymptomatic  General: Well-developed, well-nourished, no acute distress. Eyes: Pink conjunctiva, anicteric sclera. HEENT: Normocephalic, moist mucous membranes. Lungs: No audible wheezing or coughing. Heart: Regular rate and rhythm. Abdomen: Soft, nontender, no obvious distention. Musculoskeletal: No edema, cyanosis, or clubbing. Neuro: Alert, answering all questions appropriately. Cranial nerves grossly intact. Skin: No rashes or petechiae noted. Psych: Normal affect.  LAB RESULTS:  Lab Results  Component Value Date   NA 129 (L) 04/22/2022   K 3.7  04/22/2022   CL 96 (L) 04/22/2022   CO2 26 04/22/2022   GLUCOSE 132 (H) 04/22/2022   BUN 7 (L) 04/22/2022   CREATININE 0.62 04/22/2022   CALCIUM 9.0 04/22/2022   PROT 7.7 04/22/2022   ALBUMIN 3.5 04/22/2022   AST 30 04/22/2022   ALT 22 04/22/2022   ALKPHOS 219 (H) 04/22/2022   BILITOT 0.5 04/22/2022   GFRNONAA >60 04/22/2022   GFRAA 84 08/13/2020    Lab Results  Component Value Date   WBC 3.1 (L) 04/22/2022   NEUTROABS 1.9 04/22/2022   HGB 10.2 (L) 04/22/2022   HCT 30.1 (L) 04/22/2022   MCV 86.0 04/22/2022   PLT 112 (L) 04/22/2022     STUDIES: IR IMAGING GUIDED PORT INSERTION  Result Date: 04/06/2022 INDICATION: Pancreatic cancer.  Chemotherapy induction. EXAM: IMPLANTED PORT A CATH PLACEMENT WITH ULTRASOUND AND FLUOROSCOPIC GUIDANCE MEDICATIONS: None; The antibiotic was administered within an  appropriate time interval prior to skin puncture. ANESTHESIA/SEDATION: Moderate (conscious) sedation was employed during this procedure. A total of Versed 3 mg and Fentanyl 50 mcg was administered intravenously. Moderate Sedation Time: 11 minutes. The patient's level of consciousness and vital signs were monitored continuously by radiology nursing throughout the procedure under my direct supervision. FLUOROSCOPY TIME:  Fluoroscopic dose; 6.8 mGy COMPLICATIONS: None immediate. PROCEDURE: The procedure, risks, benefits, and alternatives were explained to the patient. Questions regarding the procedure were encouraged and answered. The patient understands and consents to the procedure. The RIGHT neck and chest were prepped with chlorhexidine in a sterile fashion, and a sterile drape was applied covering the operative field. Maximum barrier sterile technique with sterile gowns and gloves were used for the procedure. A timeout was performed prior to the initiation of the procedure. Local anesthesia was provided with 1% lidocaine with epinephrine. After creating a small venotomy incision, a  micropuncture kit was utilized to access the internal jugular vein under direct, real-time ultrasound guidance. Ultrasound image documentation was performed. The microwire was kinked to measure appropriate catheter length. A subcutaneous port pocket was then created along the upper chest wall utilizing a combination of sharp and blunt dissection. The pocket was irrigated with sterile saline. A single lumen Non-ISP power injectable port was chosen for placement. The 8 Fr catheter was tunneled from the port pocket site to the venotomy incision. The port was placed in the pocket. The external catheter was trimmed to appropriate length. At the venotomy, an 8 Fr peel-away sheath was placed over a guidewire under fluoroscopic guidance. The catheter was then placed through the sheath and the sheath was removed. Final catheter positioning was confirmed and documented with a fluoroscopic spot radiograph. The port was accessed with a Huber needle, aspirated and flushed with heparinized saline. The port pocket incision was closed with interrupted 3-0 Vicryl suture then Dermabond was applied, including at the venotomy incision. Dressings were placed. The patient tolerated the procedure well without immediate post procedural complication. IMPRESSION: Successful placement of a RIGHT internal jugular approach power injectable Port-A-Cath. The tip of the catheter is positioned at the superior cavo-atrial junction. The catheter is ready for immediate use. Michaelle Birks, MD [facility] Radiology Electronically Signed   By: Michaelle Birks M.D.   On: 04/06/2022 16:56    ASSESSMENT: Stage IV pancreatic adenocarcinoma.  PLAN:    1.  Stage IV pancreatic adenocarcinoma.  Repeat CT scan on January 19, 2022 reviewed independently with increasing size of suspicious pancreatic lesion now measuring 2.3 cm. Repeat EUS at [facility] finally confirmed adenocarcinoma.  Patient was given a  referral to surgery for consideration of resection despite her advanced age, unfortunately PET scan results from March 09, 2022 reviewed independently and report as above revealing metastatic disease in patient's liver.  After lengthy discussion with the patient, she wishes to pursue systemic chemotherapy using weekly gemcitabine plus Abraxane on days 1, 8, and 15 with day 22 off. Her pretreatment CA 19-9 increased significantly to 22,951.  Patient has now had port placement.  Delay cycle 1, day 15 of treatment today.  Return to clinic in 1 week for further evaluation and reconsideration of treatment.   2.  Hyperbilirubinemia: Resolved.  Patient now has a metal stent in place. 3.  Transaminitis: Resolved. 4.  Alkaline phosphatase elevation: Chronic and unchanged.. 5.  Leukopenia: Chronic and unchanged.  Patient's total white count is 3.1 today.  Monitor. 6.  Anemia: Monitor.  Hemoglobin continues  to slowly trend down and is now 10.2. 7.  Thrombocytopenia: Monitor.  Platelets are 112 today. 8.  Hyponatremia: Sodium is trending down and 129 today.   9.  Weight loss: Monitor.  Consider dietary referral in the future. 10.  Congestion/chills: Delay treatment as above.  Patient has been instructed to call clinic if she has fever.   Patient expressed understanding and was in agreement with this plan. She also understands that She can call clinic at any time with any questions, concerns, or complaints.    Cancer Staging  Malignant tumor of head of pancreas [facility]) Staging form: Exocrine Pancreas, AJCC 8th Edition - Clinical stage from 03/26/2022: Stage IV (cT2, cN0, cM1) - Signed by Lloyd Huger, MD on 03/26/2022 Total positive nodes: 0   Lloyd Huger, MD   04/23/2022 6:09 AM

## 2022-04-23 ENCOUNTER — Encounter: Payer: Self-pay | Admitting: Oncology

## 2022-04-29 ENCOUNTER — Encounter: Payer: Self-pay | Admitting: Oncology

## 2022-04-29 ENCOUNTER — Inpatient Hospital Stay: Payer: Medicare Other

## 2022-04-29 ENCOUNTER — Inpatient Hospital Stay (HOSPITAL_BASED_OUTPATIENT_CLINIC_OR_DEPARTMENT_OTHER): Payer: Medicare Other | Admitting: Oncology

## 2022-04-29 DIAGNOSIS — Z5111 Encounter for antineoplastic chemotherapy: Secondary | ICD-10-CM | POA: Diagnosis not present

## 2022-04-29 DIAGNOSIS — C250 Malignant neoplasm of head of pancreas: Secondary | ICD-10-CM

## 2022-04-29 DIAGNOSIS — D696 Thrombocytopenia, unspecified: Secondary | ICD-10-CM | POA: Diagnosis not present

## 2022-04-29 DIAGNOSIS — D72819 Decreased white blood cell count, unspecified: Secondary | ICD-10-CM | POA: Diagnosis not present

## 2022-04-29 DIAGNOSIS — D649 Anemia, unspecified: Secondary | ICD-10-CM | POA: Diagnosis not present

## 2022-04-29 DIAGNOSIS — E871 Hypo-osmolality and hyponatremia: Secondary | ICD-10-CM | POA: Diagnosis not present

## 2022-04-29 LAB — CBC WITH DIFFERENTIAL/PLATELET
Abs Immature Granulocytes: 0.01 10*3/uL (ref 0.00–0.07)
Basophils Absolute: 0 10*3/uL (ref 0.0–0.1)
Basophils Relative: 1 %
Eosinophils Absolute: 0 10*3/uL (ref 0.0–0.5)
Eosinophils Relative: 0 %
HCT: 30.6 % — ABNORMAL LOW (ref 36.0–46.0)
Hemoglobin: 10.1 g/dL — ABNORMAL LOW (ref 12.0–15.0)
Immature Granulocytes: 0 %
Lymphocytes Relative: 18 %
Lymphs Abs: 0.9 10*3/uL (ref 0.7–4.0)
MCH: 29 pg (ref 26.0–34.0)
MCHC: 33 g/dL (ref 30.0–36.0)
MCV: 87.9 fL (ref 80.0–100.0)
Monocytes Absolute: 0.6 10*3/uL (ref 0.1–1.0)
Monocytes Relative: 11 %
Neutro Abs: 3.5 10*3/uL (ref 1.7–7.7)
Neutrophils Relative %: 70 %
Platelets: 440 10*3/uL — ABNORMAL HIGH (ref 150–400)
RBC: 3.48 MIL/uL — ABNORMAL LOW (ref 3.87–5.11)
RDW: 14.7 % (ref 11.5–15.5)
WBC: 5 10*3/uL (ref 4.0–10.5)
nRBC: 0 % (ref 0.0–0.2)

## 2022-04-29 LAB — COMPREHENSIVE METABOLIC PANEL
ALT: 27 U/L (ref 0–44)
AST: 36 U/L (ref 15–41)
Albumin: 3.3 g/dL — ABNORMAL LOW (ref 3.5–5.0)
Alkaline Phosphatase: 232 U/L — ABNORMAL HIGH (ref 38–126)
Anion gap: 6 (ref 5–15)
BUN: 9 mg/dL (ref 8–23)
CO2: 26 mmol/L (ref 22–32)
Calcium: 8.7 mg/dL — ABNORMAL LOW (ref 8.9–10.3)
Chloride: 100 mmol/L (ref 98–111)
Creatinine, Ser: 0.47 mg/dL (ref 0.44–1.00)
GFR, Estimated: >60 mL/min (ref >60)
Glucose, Bld: 106 mg/dL — ABNORMAL HIGH (ref 70–99)
Potassium: 3.8 mmol/L (ref 3.5–5.1)
Sodium: 132 mmol/L — ABNORMAL LOW (ref 135–145)
Total Bilirubin: 0.3 mg/dL (ref 0.3–1.2)
Total Protein: 7.8 g/dL (ref 6.5–8.1)

## 2022-04-29 MED ORDER — SODIUM CHLORIDE 0.9 % IV SOLN
1000.0000 mg/m2 | Freq: Once | INTRAVENOUS | Status: AC
  Administered 2022-04-29: 1559 mg via INTRAVENOUS
  Filled 2022-04-29: qty 15.78

## 2022-04-29 MED ORDER — PACLITAXEL PROTEIN-BOUND CHEMO INJECTION 100 MG
125.0000 mg/m2 | Freq: Once | INTRAVENOUS | Status: AC
  Administered 2022-04-29: 200 mg via INTRAVENOUS
  Filled 2022-04-29: qty 40

## 2022-04-29 MED ORDER — HEPARIN SOD (PORK) LOCK FLUSH 100 UNIT/ML IV SOLN
500.0000 [IU] | Freq: Once | INTRAVENOUS | Status: AC | PRN
  Administered 2022-04-29: 500 [IU]
  Filled 2022-04-29: qty 5

## 2022-04-29 MED ORDER — PROCHLORPERAZINE MALEATE 10 MG PO TABS
10.0000 mg | ORAL_TABLET | Freq: Once | ORAL | Status: AC
  Administered 2022-04-29: 10 mg via ORAL
  Filled 2022-04-29: qty 1

## 2022-04-29 MED ORDER — SODIUM CHLORIDE 0.9 % IV SOLN
Freq: Once | INTRAVENOUS | Status: AC
  Filled 2022-04-29: qty 250

## 2022-04-29 NOTE — Patient Instructions (Signed)
[facility] CANCER CTR AT [place]  Discharge Instructions: Thank you for choosing [facility] to provide your oncology and hematology care.  If you have a lab appointment with the [facility], please go directly to the [facility] and check in at the registration area.  Wear comfortable clothing and clothing appropriate for easy access to any Portacath or PICC line.   We strive to give you quality time with your provider. You may need to reschedule your appointment if you arrive late (15 or more minutes).  Arriving late affects you and other patients whose appointments are after yours.  Also, if you miss three or more appointments without notifying the office, you may be dismissed from the clinic at the provider's discretion.      For prescription refill requests, have your pharmacy contact our office and allow 72 hours for refills to be completed.    Today you received the following chemotherapy and/or immunotherapy agents Abraxane and Gemzar.      To help prevent nausea and vomiting after your treatment, we encourage you to take your nausea medication as directed.  BELOW ARE SYMPTOMS THAT SHOULD BE REPORTED IMMEDIATELY: *FEVER GREATER THAN 100.4 F (38 C) OR HIGHER *CHILLS OR SWEATING *NAUSEA AND VOMITING THAT IS NOT CONTROLLED WITH YOUR NAUSEA MEDICATION *UNUSUAL SHORTNESS OF BREATH *UNUSUAL BRUISING OR BLEEDING *URINARY PROBLEMS (pain or burning when urinating, or frequent urination) *BOWEL PROBLEMS (unusual diarrhea, constipation, pain near the anus) TENDERNESS IN MOUTH AND THROAT WITH OR WITHOUT PRESENCE OF ULCERS (sore throat, sores in mouth, or a toothache) UNUSUAL RASH, SWELLING OR PAIN  UNUSUAL VAGINAL DISCHARGE OR ITCHING   Items with * indicate a potential emergency and should be followed up as soon as possible or go to the Emergency Department if any problems should occur.  Please show the CHEMOTHERAPY ALERT CARD or IMMUNOTHERAPY ALERT CARD at  check-in to the Emergency Department and triage nurse.  Should you have questions after your visit or need to cancel or reschedule your appointment, please contact [facility] [place] AT [place]  [phone] and follow the prompts.  Office hours are 8:00 a.m. to 4:30 p.m. Monday - Friday. Please note that voicemails left after 4:00 p.m. may not be returned until the following business day.  We are closed weekends and major holidays. You have access to a nurse at all times for urgent questions. Please call the main number to the clinic [phone] and follow the prompts.  For any non-urgent questions, you may also contact your provider using MyChart. We now offer e-Visits for anyone 67 and older to request care online for non-urgent symptoms. For details visit [email or web address].   Also download the MyChart app! Go to the app store, search "MyChart", open the app, select [place], and log in with your MyChart username and password.

## 2022-04-29 NOTE — Progress Notes (Signed)
[place]  [phone] [phone]  ID: Meredith Wilcox OB: 03/02/1941  MR#: [id number]  AOZ#:[id number]  Patient Care Team: [name], MD as PCP - General (Family Medicine) Birder Robson, MD as Consulting Physician (Ophthalmology) Clent Jacks, RN as Oncology Nurse Navigator Grayland Ormond, Kathlene November, MD as Consulting Physician (Oncology)  CHIEF COMPLAINT: Stage IV pancreatic adenocarcinoma.  INTERVAL HISTORY: Patient returns to clinic today for further evaluation and reconsideration of cycle 2, day 1 of gemcitabine and Abraxane.  She feels significantly improved and back to her baseline.  She denies any fevers.  She has no neurologic complaints. She has no chest pain, shortness of breath, cough, or hemoptysis.  She does not complain of abdominal pain today.  She has no nausea, vomiting, constipation, or diarrhea. She has no urinary complaints.  Patient offers no specific complaints today.  REVIEW OF SYSTEMS:   Review of Systems  Constitutional: Negative.  Negative for chills, fever, malaise/fatigue and weight loss.  HENT:  Negative for congestion.   Respiratory: Negative.  Negative for cough, hemoptysis and shortness of breath.   Cardiovascular: Negative.  Negative for chest pain and leg swelling.  Gastrointestinal: Negative.  Negative for abdominal pain and heartburn.  Genitourinary: Negative.  Negative for dysuria.  Musculoskeletal: Negative.  Negative for back pain.  Skin: Negative.  Negative for rash.  Neurological: Negative.  Negative for dizziness, seizures, weakness and headaches.  Psychiatric/Behavioral: Negative.  The patient is not nervous/anxious.     As per HPI. Otherwise, a complete review of systems is negative.  PAST MEDICAL HISTORY: Past Medical History:  Diagnosis Date   Anemia    H/O DURING PREGNANCY   Arthritis    Chronic kidney disease    KIDNEY PROBLEMS AROUND AGE 82   Geographic tongue    GERD  (gastroesophageal reflux disease)    H/O NO MEDS   Helicobacter pylori gastrointestinal tract infection    Hyperlipidemia    Incontinence    Indigestion    Prolapse of vaginal walls    Right sided sciatica    Tachycardia    Urethral prolapse     PAST SURGICAL HISTORY: Past Surgical History:  Procedure Laterality Date   BREAST BIOPSY Left 10/11/2016   left breast stereo/ VASCULAR LESION   BREAST BIOPSY Left 12/02/2016   Procedure: NEEDLE LOCALIZATION;  Surgeon: [name], MD;  Location: [facility] ORS;  Service: General;  Laterality: Left;   BREAST EXCISIONAL BIOPSY Left 12/02/2016   left lumpectomy   BREAST LUMPECTOMY Left 12/02/2016   Procedure: EXCISION BREAST MASS;  Surgeon: [name], MD;  Location: [facility] ORS;  Service: General;  Laterality: Left;   DILATION AND CURETTAGE OF UTERUS     dnc     1983   ERCP N/A 07/07/2021   Procedure: ENDOSCOPIC RETROGRADE CHOLANGIOPANCREATOGRAPHY (ERCP);  Surgeon: [name], MD;  Location: [facility] ENDOSCOPY;  Service: Endoscopy;  Laterality: N/A;   ERCP N/A 10/01/2021   Procedure: ENDOSCOPIC RETROGRADE CHOLANGIOPANCREATOGRAPHY (ERCP);  Surgeon: [name], MD;  Location: [facility] ENDOSCOPY;  Service: Endoscopy;  Laterality: N/A;   fatty tumor removal Right    hand   IR IMAGING GUIDED PORT INSERTION  04/06/2022    FAMILY HISTORY: Family History  Problem Relation Age of Onset   Diabetes Mother    Hypertension Mother    Cardiomyopathy Sister    Down syndrome Sister    Leukemia Brother    Alcohol abuse Son        youngest son   Asthma  Son        youngest son   Rheum arthritis Sister    Drug abuse Child 83   Breast cancer Neg Hx    Kidney cancer Neg Hx    Bladder Cancer Neg Hx    Prostate cancer Neg Hx     ADVANCED DIRECTIVES (Y/N):  N  HEALTH MAINTENANCE: Social History   Tobacco Use   Smoking status: Never   Smokeless tobacco: Never   Tobacco comments:    smoking cessation materials not required  Vaping  Use   Vaping Use: Never used  Substance Use Topics   Alcohol use: No    Alcohol/week: 0.0 standard drinks of alcohol   Drug use: No     Colonoscopy:  PAP:  Bone density:  Lipid panel:  Allergies  Allergen Reactions   Tussionex Pennkinetic Er [Hydrocod Poli-Chlorphe Poli Er] Other (See Comments)    Real dizzy headed even after a while still walking every direction but straight, patient stated she stopped taking it because of that.   Hydrocodone     Light headed and dizziness    Current Outpatient Medications  Medication Sig Dispense Refill   lidocaine-prilocaine (EMLA) cream Apply to affected area once 30 g 3   ondansetron (ZOFRAN) 8 MG tablet Take 1 tablet (8 mg total) by mouth every 8 (eight) hours as needed for nausea or vomiting. 60 tablet 2   prochlorperazine (COMPAZINE) 10 MG tablet Take 1 tablet (10 mg total) by mouth every 6 (six) hours as needed for nausea or vomiting. 60 tablet 2   amLODipine (NORVASC) 2.5 MG tablet Take 1 tablet (2.5 mg total) by mouth every evening. (Patient not taking: Reported on 03/26/2022) 90 tablet 0   No current facility-administered medications for this visit.    OBJECTIVE: Vitals:   04/29/22 1016  BP: (!) 157/64  Pulse: 75  Resp: 16  Temp: (!) 96.4 F (35.8 C)  SpO2: 100%      Body mass index is 18.8 kg/m.    ECOG FS:0 - Asymptomatic  General: Well-developed, well-nourished, no acute distress. Eyes: Pink conjunctiva, anicteric sclera. HEENT: Normocephalic, moist mucous membranes. Lungs: No audible wheezing or coughing. Heart: Regular rate and rhythm. Abdomen: Soft, nontender, no obvious distention. Musculoskeletal: No edema, cyanosis, or clubbing. Neuro: Alert, answering all questions appropriately. Cranial nerves grossly intact. Skin: No rashes or petechiae noted. Psych: Normal affect.  LAB RESULTS:  Lab Results  Component Value Date   NA 132 (L) 04/29/2022   K 3.8 04/29/2022   CL 100 04/29/2022   CO2 26 04/29/2022    GLUCOSE 106 (H) 04/29/2022   BUN 9 04/29/2022   CREATININE 0.47 04/29/2022   CALCIUM 8.7 (L) 04/29/2022   PROT 7.8 04/29/2022   ALBUMIN 3.3 (L) 04/29/2022   AST 36 04/29/2022   ALT 27 04/29/2022   ALKPHOS 232 (H) 04/29/2022   BILITOT 0.3 04/29/2022   GFRNONAA >60 04/29/2022   GFRAA 84 08/13/2020    Lab Results  Component Value Date   WBC 5.0 04/29/2022   NEUTROABS 3.5 04/29/2022   HGB 10.1 (L) 04/29/2022   HCT 30.6 (L) 04/29/2022   MCV 87.9 04/29/2022   PLT 440 (H) 04/29/2022     STUDIES: IR IMAGING GUIDED PORT INSERTION  Result Date: 04/06/2022 INDICATION: Pancreatic cancer.  Chemotherapy induction. EXAM: IMPLANTED PORT A CATH PLACEMENT WITH ULTRASOUND AND FLUOROSCOPIC GUIDANCE MEDICATIONS: None; The antibiotic was administered within an appropriate time interval prior to skin puncture. ANESTHESIA/SEDATION: Moderate (conscious) sedation was employed during  this procedure. A total of Versed 3 mg and Fentanyl 50 mcg was administered intravenously. Moderate Sedation Time: 11 minutes. The patient's level of consciousness and vital signs were monitored continuously by radiology nursing throughout the procedure under my direct supervision. FLUOROSCOPY TIME:  Fluoroscopic dose; 6.8 mGy COMPLICATIONS: None immediate. PROCEDURE: The procedure, risks, benefits, and alternatives were explained to the patient. Questions regarding the procedure were encouraged and answered. The patient understands and consents to the procedure. The RIGHT neck and chest were prepped with chlorhexidine in a sterile fashion, and a sterile drape was applied covering the operative field. Maximum barrier sterile technique with sterile gowns and gloves were used for the procedure. A timeout was performed prior to the initiation of the procedure. Local anesthesia was provided with 1% lidocaine with epinephrine. After creating a small venotomy incision, a micropuncture kit was utilized to access the internal jugular vein  under direct, real-time ultrasound guidance. Ultrasound image documentation was performed. The microwire was kinked to measure appropriate catheter length. A subcutaneous port pocket was then created along the upper chest wall utilizing a combination of sharp and blunt dissection. The pocket was irrigated with sterile saline. A single lumen Non-ISP power injectable port was chosen for placement. The 8 Fr catheter was tunneled from the port pocket site to the venotomy incision. The port was placed in the pocket. The external catheter was trimmed to appropriate length. At the venotomy, an 8 Fr peel-away sheath was placed over a guidewire under fluoroscopic guidance. The catheter was then placed through the sheath and the sheath was removed. Final catheter positioning was confirmed and documented with a fluoroscopic spot radiograph. The port was accessed with a Huber needle, aspirated and flushed with heparinized saline. The port pocket incision was closed with interrupted 3-0 Vicryl suture then Dermabond was applied, including at the venotomy incision. Dressings were placed. The patient tolerated the procedure well without immediate post procedural complication. IMPRESSION: Successful placement of a RIGHT internal jugular approach power injectable Port-A-Cath. The tip of the catheter is positioned at the superior cavo-atrial junction. The catheter is ready for immediate use. Michaelle Birks, MD [facility] Electronically Signed   By: Michaelle Birks M.D.   On: 04/06/2022 16:56    ASSESSMENT: Stage IV pancreatic adenocarcinoma.  PLAN:    1.  Stage IV pancreatic adenocarcinoma.  Repeat CT scan on January 19, 2022 reviewed independently with increasing size of suspicious pancreatic lesion now measuring 2.3 cm. Repeat EUS at [facility] finally confirmed adenocarcinoma.  Patient was given a referral to surgery for consideration of resection despite her advanced  age, unfortunately PET scan results from March 09, 2022 reviewed independently and report as above revealing metastatic disease in patient's liver.  After lengthy discussion with the patient, she wishes to pursue systemic chemotherapy using weekly gemcitabine plus Abraxane on days 1, 8, and 15 with day 22 off. Her pretreatment CA 19-9 increased significantly to 22,951.  Patient has now had port placement.  Proceed with cycle 1, day 15 of treatment today.  Return to clinic in 2 weeks for further evaluation and consideration of cycle 2, day 1.   2.  Hyperbilirubinemia: Resolved.  Patient now has a metal stent in place. 3.  Transaminitis: Resolved. 4.  Alkaline phosphatase elevation: Chronic and unchanged.   5.  Leukopenia: Resolved.   6.  Anemia: Chronic and unchanged.  Patient's hemoglobin is 10.1 today.   7.  Thrombocytopenia: Patient now has thrombocytosis. 8.  Hyponatremia: Sodium  improved to 132. 9.  Weight loss: Approved.  Consider dietary referral in the future. 10.  Congestion/chills: Resolved.   Patient expressed understanding and was in agreement with this plan. She also understands that She can call clinic at any time with any questions, concerns, or complaints.    Cancer Staging  Malignant tumor of head of pancreas [facility]) Staging form: Exocrine Pancreas, AJCC 8th Edition - Clinical stage from 03/26/2022: Stage IV (cT2, cN0, cM1) - Signed by Lloyd Huger, MD on 03/26/2022 Total positive nodes: 0   Lloyd Huger, MD   04/29/2022 1:09 PM

## 2022-05-06 ENCOUNTER — Ambulatory Visit: Payer: Medicare Other

## 2022-05-06 ENCOUNTER — Ambulatory Visit: Payer: Medicare Other | Admitting: Oncology

## 2022-05-06 ENCOUNTER — Other Ambulatory Visit: Payer: Medicare Other

## 2022-05-07 ENCOUNTER — Other Ambulatory Visit: Payer: Self-pay

## 2022-05-13 ENCOUNTER — Encounter: Payer: Self-pay | Admitting: Oncology

## 2022-05-13 ENCOUNTER — Inpatient Hospital Stay (HOSPITAL_BASED_OUTPATIENT_CLINIC_OR_DEPARTMENT_OTHER): Payer: Medicare Other | Admitting: Oncology

## 2022-05-13 ENCOUNTER — Inpatient Hospital Stay: Payer: Medicare Other | Attending: Oncology

## 2022-05-13 ENCOUNTER — Inpatient Hospital Stay: Payer: Medicare Other

## 2022-05-13 DIAGNOSIS — D72829 Elevated white blood cell count, unspecified: Secondary | ICD-10-CM | POA: Diagnosis not present

## 2022-05-13 DIAGNOSIS — R7401 Elevation of levels of liver transaminase levels: Secondary | ICD-10-CM | POA: Diagnosis not present

## 2022-05-13 DIAGNOSIS — Z5111 Encounter for antineoplastic chemotherapy: Secondary | ICD-10-CM | POA: Insufficient documentation

## 2022-05-13 DIAGNOSIS — C250 Malignant neoplasm of head of pancreas: Secondary | ICD-10-CM

## 2022-05-13 DIAGNOSIS — Z681 Body mass index (BMI) 19 or less, adult: Secondary | ICD-10-CM | POA: Insufficient documentation

## 2022-05-13 DIAGNOSIS — E871 Hypo-osmolality and hyponatremia: Secondary | ICD-10-CM | POA: Diagnosis not present

## 2022-05-13 DIAGNOSIS — D75839 Thrombocytosis, unspecified: Secondary | ICD-10-CM | POA: Insufficient documentation

## 2022-05-13 DIAGNOSIS — R748 Abnormal levels of other serum enzymes: Secondary | ICD-10-CM | POA: Insufficient documentation

## 2022-05-13 DIAGNOSIS — R630 Anorexia: Secondary | ICD-10-CM | POA: Insufficient documentation

## 2022-05-13 DIAGNOSIS — D649 Anemia, unspecified: Secondary | ICD-10-CM | POA: Diagnosis not present

## 2022-05-13 LAB — COMPREHENSIVE METABOLIC PANEL
ALT: 44 U/L (ref 0–44)
AST: 56 U/L — ABNORMAL HIGH (ref 15–41)
Albumin: 3.2 g/dL — ABNORMAL LOW (ref 3.5–5.0)
Alkaline Phosphatase: 484 U/L — ABNORMAL HIGH (ref 38–126)
Anion gap: 9 (ref 5–15)
BUN: 13 mg/dL (ref 8–23)
CO2: 26 mmol/L (ref 22–32)
Calcium: 8.8 mg/dL — ABNORMAL LOW (ref 8.9–10.3)
Chloride: 99 mmol/L (ref 98–111)
Creatinine, Ser: 0.73 mg/dL (ref 0.44–1.00)
GFR, Estimated: >60 mL/min (ref >60)
Glucose, Bld: 185 mg/dL — ABNORMAL HIGH (ref 70–99)
Potassium: 3.8 mmol/L (ref 3.5–5.1)
Sodium: 134 mmol/L — ABNORMAL LOW (ref 135–145)
Total Bilirubin: 0.5 mg/dL (ref 0.3–1.2)
Total Protein: 8 g/dL (ref 6.5–8.1)

## 2022-05-13 LAB — CBC WITH DIFFERENTIAL/PLATELET
Abs Immature Granulocytes: 0.03 10*3/uL (ref 0.00–0.07)
Basophils Absolute: 0 10*3/uL (ref 0.0–0.1)
Basophils Relative: 1 %
Eosinophils Absolute: 0.1 10*3/uL (ref 0.0–0.5)
Eosinophils Relative: 1 %
HCT: 28.5 % — ABNORMAL LOW (ref 36.0–46.0)
Hemoglobin: 9.3 g/dL — ABNORMAL LOW (ref 12.0–15.0)
Immature Granulocytes: 0 %
Lymphocytes Relative: 11 %
Lymphs Abs: 0.9 10*3/uL (ref 0.7–4.0)
MCH: 28.9 pg (ref 26.0–34.0)
MCHC: 32.6 g/dL (ref 30.0–36.0)
MCV: 88.5 fL (ref 80.0–100.0)
Monocytes Absolute: 0.9 10*3/uL (ref 0.1–1.0)
Monocytes Relative: 11 %
Neutro Abs: 6.1 10*3/uL (ref 1.7–7.7)
Neutrophils Relative %: 76 %
Platelets: 306 10*3/uL (ref 150–400)
RBC: 3.22 MIL/uL — ABNORMAL LOW (ref 3.87–5.11)
RDW: 15.5 % (ref 11.5–15.5)
WBC: 8 10*3/uL (ref 4.0–10.5)
nRBC: 0 % (ref 0.0–0.2)

## 2022-05-13 MED ORDER — PACLITAXEL PROTEIN-BOUND CHEMO INJECTION 100 MG
125.0000 mg/m2 | Freq: Once | INTRAVENOUS | Status: AC
  Administered 2022-05-13: 200 mg via INTRAVENOUS
  Filled 2022-05-13: qty 40

## 2022-05-13 MED ORDER — SODIUM CHLORIDE 0.9 % IV SOLN
1000.0000 mg/m2 | Freq: Once | INTRAVENOUS | Status: AC
  Administered 2022-05-13: 1559 mg via INTRAVENOUS
  Filled 2022-05-13: qty 26.3

## 2022-05-13 MED ORDER — SODIUM CHLORIDE 0.9 % IV SOLN
Freq: Once | INTRAVENOUS | Status: AC
  Filled 2022-05-13: qty 250

## 2022-05-13 MED ORDER — HEPARIN SOD (PORK) LOCK FLUSH 100 UNIT/ML IV SOLN
500.0000 [IU] | Freq: Once | INTRAVENOUS | Status: AC | PRN
  Administered 2022-05-13: 500 [IU]
  Filled 2022-05-13: qty 5

## 2022-05-13 MED ORDER — PROCHLORPERAZINE MALEATE 10 MG PO TABS
10.0000 mg | ORAL_TABLET | Freq: Once | ORAL | Status: AC
  Administered 2022-05-13: 10 mg via ORAL
  Filled 2022-05-13: qty 1

## 2022-05-13 NOTE — Patient Instructions (Signed)
[place]  Discharge Instructions: Thank you for choosing [facility] to provide your oncology and hematology care.  If you have a lab appointment with the [facility], please go directly to the [facility] and check in at the registration area.  Wear comfortable clothing and clothing appropriate for easy access to any Portacath or PICC line.   We strive to give you quality time with your provider. You may need to reschedule your appointment if you arrive late (15 or more minutes).  Arriving late affects you and other patients whose appointments are after yours.  Also, if you miss three or more appointments without notifying the office, you may be dismissed from the clinic at the provider's discretion.      For prescription refill requests, have your pharmacy contact our office and allow 72 hours for refills to be completed.    To help prevent nausea and vomiting after your treatment, we encourage you to take your nausea medication as directed.  BELOW ARE SYMPTOMS THAT SHOULD BE REPORTED IMMEDIATELY: *FEVER GREATER THAN 100.4 F (38 C) OR HIGHER *CHILLS OR SWEATING *NAUSEA AND VOMITING THAT IS NOT CONTROLLED WITH YOUR NAUSEA MEDICATION *UNUSUAL SHORTNESS OF BREATH *UNUSUAL BRUISING OR BLEEDING *URINARY PROBLEMS (pain or burning when urinating, or frequent urination) *BOWEL PROBLEMS (unusual diarrhea, constipation, pain near the anus) TENDERNESS IN MOUTH AND THROAT WITH OR WITHOUT PRESENCE OF ULCERS (sore throat, sores in mouth, or a toothache) UNUSUAL RASH, SWELLING OR PAIN  UNUSUAL VAGINAL DISCHARGE OR ITCHING   Items with * indicate a potential emergency and should be followed up as soon as possible or go to the Emergency Department if any problems should occur.  Please show the CHEMOTHERAPY ALERT CARD or IMMUNOTHERAPY ALERT CARD at check-in to the Emergency Department and triage nurse.  Should you have questions after your visit or  need to cancel or reschedule your appointment, please contact [facility]  [phone] and follow the prompts.  Office hours are 8:00 a.m. to 4:30 p.m. Monday - Friday. Please note that voicemails left after 4:00 p.m. may not be returned until the following business day.  We are closed weekends and major holidays. You have access to a nurse at all times for urgent questions. Please call the main number to the clinic [phone] and follow the prompts.  For any non-urgent questions, you may also contact your provider using MyChart. We now offer e-Visits for anyone 66 and older to request care online for non-urgent symptoms. For details visit [email or web address].   Also download the MyChart app! Go to the app store, search "MyChart", open the app, select [facility], and log in with your MyChart username and password.

## 2022-05-13 NOTE — Progress Notes (Signed)
[place]  Telephone:([phone]) [phone] [phone]) [phone]  ID: Meredith Wilcox OB: 09-Dec-1940  MR#: [id number]  HBZ#:[id number]  Patient Care Team: [name], MD as PCP - General (Family Medicine) Birder Robson, MD as Consulting Physician (Ophthalmology) Clent Jacks, RN as Oncology Nurse Navigator Grayland Ormond, Kathlene November, MD as Consulting Physician (Oncology)  CHIEF COMPLAINT: Stage IV pancreatic adenocarcinoma.  INTERVAL HISTORY: Patient returns to clinic today for further evaluation and consideration of cycle 2, day 1 of gemcitabine and Abraxane.  She currently feels well and is asymptomatic.  She has a good appetite, but continues to lose weight.  She denies any recent fevers or illnesses.  She has no neurologic complaints. She has no chest pain, shortness of breath, cough, or hemoptysis.  She does not complain of abdominal pain today.  She has no nausea, vomiting, constipation, or diarrhea. She has no urinary complaints.  Patient offers no further specific complaints today.  REVIEW OF SYSTEMS:   Review of Systems  Constitutional: Negative.  Negative for chills, fever, malaise/fatigue and weight loss.  HENT:  Negative for congestion.   Respiratory: Negative.  Negative for cough, hemoptysis and shortness of breath.   Cardiovascular: Negative.  Negative for chest pain and leg swelling.  Gastrointestinal: Negative.  Negative for abdominal pain and heartburn.  Genitourinary: Negative.  Negative for dysuria.  Musculoskeletal: Negative.  Negative for back pain.  Skin: Negative.  Negative for rash.  Neurological: Negative.  Negative for dizziness, seizures, weakness and headaches.  Psychiatric/Behavioral: Negative.  The patient is not nervous/anxious.     As per HPI. Otherwise, a complete review of systems is negative.  PAST MEDICAL HISTORY: Past Medical History:  Diagnosis Date   Anemia    H/O DURING PREGNANCY   Arthritis    Chronic kidney disease    KIDNEY  PROBLEMS AROUND AGE 61   Geographic tongue    GERD (gastroesophageal reflux disease)    H/O NO MEDS   Helicobacter pylori gastrointestinal tract infection    Hyperlipidemia    Incontinence    Indigestion    Prolapse of vaginal walls    Right sided sciatica    Tachycardia    Urethral prolapse     PAST SURGICAL HISTORY: Past Surgical History:  Procedure Laterality Date   BREAST BIOPSY Left 10/11/2016   left breast stereo/ VASCULAR LESION   BREAST BIOPSY Left 12/02/2016   Procedure: NEEDLE LOCALIZATION;  Surgeon: [name], MD;  Location: [facility] ORS;  Service: General;  Laterality: Left;   BREAST EXCISIONAL BIOPSY Left 12/02/2016   left lumpectomy   BREAST LUMPECTOMY Left 12/02/2016   Procedure: EXCISION BREAST MASS;  Surgeon: [name], MD;  Location: [facility] ORS;  Service: General;  Laterality: Left;   DILATION AND CURETTAGE OF UTERUS     dnc     1983   ERCP N/A 07/07/2021   Procedure: ENDOSCOPIC RETROGRADE CHOLANGIOPANCREATOGRAPHY (ERCP);  Surgeon: [name], MD;  Location: [facility] ENDOSCOPY;  Service: Endoscopy;  Laterality: N/A;   ERCP N/A 10/01/2021   Procedure: ENDOSCOPIC RETROGRADE CHOLANGIOPANCREATOGRAPHY (ERCP);  Surgeon: [name], MD;  Location: [facility] ENDOSCOPY;  Service: Endoscopy;  Laterality: N/A;   fatty tumor removal Right    hand   IR IMAGING GUIDED PORT INSERTION  04/06/2022    FAMILY HISTORY: Family History  Problem Relation Age of Onset   Diabetes Mother    Hypertension Mother    Cardiomyopathy Sister    Down syndrome Sister    Leukemia Brother    Alcohol abuse  Son        youngest son   Asthma Son        youngest son   Rheum arthritis Sister    Drug abuse Child 35   Breast cancer Neg Hx    Kidney cancer Neg Hx    Bladder Cancer Neg Hx    Prostate cancer Neg Hx     ADVANCED DIRECTIVES (Y/N):  N  HEALTH MAINTENANCE: Social History   Tobacco Use   Smoking status: Never   Smokeless tobacco: Never   Tobacco comments:     smoking cessation materials not required  Vaping Use   Vaping Use: Never used  Substance Use Topics   Alcohol use: No    Alcohol/week: 0.0 standard drinks of alcohol   Drug use: No     Colonoscopy:  PAP:  Bone density:  Lipid panel:  Allergies  Allergen Reactions   Tussionex Pennkinetic Er [Hydrocod Poli-Chlorphe Poli Er] Other (See Comments)    Real dizzy headed even after a while still walking every direction but straight, patient stated she stopped taking it because of that.   Hydrocodone     Light headed and dizziness    Current Outpatient Medications  Medication Sig Dispense Refill   lidocaine-prilocaine (EMLA) cream Apply to affected area once 30 g 3   ondansetron (ZOFRAN) 8 MG tablet Take 1 tablet (8 mg total) by mouth every 8 (eight) hours as needed for nausea or vomiting. 60 tablet 2   prochlorperazine (COMPAZINE) 10 MG tablet Take 1 tablet (10 mg total) by mouth every 6 (six) hours as needed for nausea or vomiting. 60 tablet 2   amLODipine (NORVASC) 2.5 MG tablet Take 1 tablet (2.5 mg total) by mouth every evening. (Patient not taking: Reported on 03/26/2022) 90 tablet 0   No current facility-administered medications for this visit.   Facility-Administered Medications Ordered in Other Visits  Medication Dose Route Frequency Provider Last Rate Last Admin   gemcitabine (GEMZAR) 1,559 mg in sodium chloride 0.9 % 250 mL chemo infusion  1,000 mg/m2 (Treatment Plan Recorded) Intravenous Once Lloyd Huger, MD       PACLitaxel-protein bound (ABRAXANE) chemo infusion 200 mg  125 mg/m2 (Treatment Plan Recorded) Intravenous Once Lloyd Huger, MD        OBJECTIVE: Vitals:   05/13/22 0932  BP: 134/61  Pulse: 86  Resp: 16  Temp: (!) 97.2 F (36.2 C)  SpO2: 100%      Body mass index is 18.14 kg/m.    ECOG FS:0 - Asymptomatic  General: Well-developed, well-nourished, no acute distress. Eyes: Pink conjunctiva, anicteric sclera. HEENT: Normocephalic, moist  mucous membranes. Lungs: No audible wheezing or coughing. Heart: Regular rate and rhythm. Abdomen: Soft, nontender, no obvious distention. Musculoskeletal: No edema, cyanosis, or clubbing. Neuro: Alert, answering all questions appropriately. Cranial nerves grossly intact. Skin: No rashes or petechiae noted. Psych: Normal affect.  LAB RESULTS:  Lab Results  Component Value Date   NA 134 (L) 05/13/2022   K 3.8 05/13/2022   CL 99 05/13/2022   CO2 26 05/13/2022   GLUCOSE 185 (H) 05/13/2022   BUN 13 05/13/2022   CREATININE 0.73 05/13/2022   CALCIUM 8.8 (L) 05/13/2022   PROT 8.0 05/13/2022   ALBUMIN 3.2 (L) 05/13/2022   AST 56 (H) 05/13/2022   ALT 44 05/13/2022   ALKPHOS 484 (H) 05/13/2022   BILITOT 0.5 05/13/2022   GFRNONAA >60 05/13/2022   GFRAA 84 08/13/2020    Lab Results  Component Value  Date   WBC 8.0 05/13/2022   NEUTROABS 6.1 05/13/2022   HGB 9.3 (L) 05/13/2022   HCT 28.5 (L) 05/13/2022   MCV 88.5 05/13/2022   PLT 306 05/13/2022     STUDIES: No results found.  ASSESSMENT: Stage IV pancreatic adenocarcinoma.  PLAN:    1.  Stage IV pancreatic adenocarcinoma.  Repeat CT scan on January 19, 2022 reviewed independently with increasing size of suspicious pancreatic lesion now measuring 2.3 cm. Repeat EUS at [facility] finally confirmed adenocarcinoma.  Patient was given a referral to surgery for consideration of resection despite her advanced age, unfortunately PET scan results from March 09, 2022 reviewed independently and report as above revealing metastatic disease in patient's liver.  After lengthy discussion with the patient, she wishes to pursue systemic chemotherapy using weekly gemcitabine plus Abraxane on days 1, 8, and 15 with day 22 off. Her pretreatment CA 19-9 increased significantly to 22,951.  Today's result is pending.  Patient has now had port placement.  Proceed with cycle 2, day 1 of treatment today.  Return to clinic in 1 week for further  evaluation and consideration of cycle 2, day 8.   2.  Hyperbilirubinemia: Resolved.  Patient now has a metal stent in place. 3.  Transaminitis: Resolved. 4.  Alkaline phosphatase elevation: Has slightly trended up to 484.  Monitor.   5.  Leukopenia: Resolved.   6.  Anemia: Hemoglobin has trended down slightly to 9.3, monitor. 7.  Thrombocytopenia: Resolved.   8.  Hyponatremia: Sodium slightly improved to 134. 9.  Weight loss: Referral to dietary as above.  Patient expressed understanding and was in agreement with this plan. She also understands that She can call clinic at any time with any questions, concerns, or complaints.    Cancer Staging  Malignant tumor of head of pancreas [facility]) Staging form: Exocrine Pancreas, AJCC 8th Edition - Clinical stage from 03/26/2022: Stage IV (cT2, cN0, cM1) - Signed by Lloyd Huger, MD on 03/26/2022 Total positive nodes: 0   Lloyd Huger, MD   05/13/2022 10:57 AM

## 2022-05-14 ENCOUNTER — Other Ambulatory Visit: Payer: Self-pay

## 2022-05-19 LAB — CANCER ANTIGEN 19-9: CA 19-9: 34774 U/mL — ABNORMAL HIGH (ref 0–35)

## 2022-05-20 ENCOUNTER — Encounter: Payer: Self-pay | Admitting: Oncology

## 2022-05-20 ENCOUNTER — Inpatient Hospital Stay (HOSPITAL_BASED_OUTPATIENT_CLINIC_OR_DEPARTMENT_OTHER): Payer: Medicare Other | Admitting: Oncology

## 2022-05-20 ENCOUNTER — Inpatient Hospital Stay: Payer: Medicare Other

## 2022-05-20 ENCOUNTER — Other Ambulatory Visit: Payer: Self-pay

## 2022-05-20 DIAGNOSIS — C250 Malignant neoplasm of head of pancreas: Secondary | ICD-10-CM

## 2022-05-20 DIAGNOSIS — E871 Hypo-osmolality and hyponatremia: Secondary | ICD-10-CM | POA: Diagnosis not present

## 2022-05-20 DIAGNOSIS — D72829 Elevated white blood cell count, unspecified: Secondary | ICD-10-CM | POA: Diagnosis not present

## 2022-05-20 DIAGNOSIS — D649 Anemia, unspecified: Secondary | ICD-10-CM | POA: Diagnosis not present

## 2022-05-20 DIAGNOSIS — Z5111 Encounter for antineoplastic chemotherapy: Secondary | ICD-10-CM | POA: Diagnosis not present

## 2022-05-20 DIAGNOSIS — D75839 Thrombocytosis, unspecified: Secondary | ICD-10-CM | POA: Diagnosis not present

## 2022-05-20 LAB — COMPREHENSIVE METABOLIC PANEL
ALT: 36 U/L (ref 0–44)
AST: 43 U/L — ABNORMAL HIGH (ref 15–41)
Albumin: 2.9 g/dL — ABNORMAL LOW (ref 3.5–5.0)
Alkaline Phosphatase: 407 U/L — ABNORMAL HIGH (ref 38–126)
Anion gap: 8 (ref 5–15)
BUN: 10 mg/dL (ref 8–23)
CO2: 25 mmol/L (ref 22–32)
Calcium: 8.8 mg/dL — ABNORMAL LOW (ref 8.9–10.3)
Chloride: 99 mmol/L (ref 98–111)
Creatinine, Ser: 0.59 mg/dL (ref 0.44–1.00)
GFR, Estimated: >60 mL/min (ref >60)
Glucose, Bld: 164 mg/dL — ABNORMAL HIGH (ref 70–99)
Potassium: 3.9 mmol/L (ref 3.5–5.1)
Sodium: 132 mmol/L — ABNORMAL LOW (ref 135–145)
Total Bilirubin: 0.5 mg/dL (ref 0.3–1.2)
Total Protein: 7.7 g/dL (ref 6.5–8.1)

## 2022-05-20 LAB — CBC WITH DIFFERENTIAL/PLATELET
Abs Immature Granulocytes: 0.03 10*3/uL (ref 0.00–0.07)
Basophils Absolute: 0 10*3/uL (ref 0.0–0.1)
Basophils Relative: 0 %
Eosinophils Absolute: 0 10*3/uL (ref 0.0–0.5)
Eosinophils Relative: 0 %
HCT: 25.5 % — ABNORMAL LOW (ref 36.0–46.0)
Hemoglobin: 8.5 g/dL — ABNORMAL LOW (ref 12.0–15.0)
Immature Granulocytes: 0 %
Lymphocytes Relative: 9 %
Lymphs Abs: 0.8 10*3/uL (ref 0.7–4.0)
MCH: 29.6 pg (ref 26.0–34.0)
MCHC: 33.3 g/dL (ref 30.0–36.0)
MCV: 88.9 fL (ref 80.0–100.0)
Monocytes Absolute: 0.6 10*3/uL (ref 0.1–1.0)
Monocytes Relative: 8 %
Neutro Abs: 7 10*3/uL (ref 1.7–7.7)
Neutrophils Relative %: 83 %
Platelets: 191 10*3/uL (ref 150–400)
RBC: 2.87 MIL/uL — ABNORMAL LOW (ref 3.87–5.11)
RDW: 15.6 % — ABNORMAL HIGH (ref 11.5–15.5)
WBC: 8.5 10*3/uL (ref 4.0–10.5)
nRBC: 0 % (ref 0.0–0.2)

## 2022-05-20 MED ORDER — HEPARIN SOD (PORK) LOCK FLUSH 100 UNIT/ML IV SOLN
500.0000 [IU] | Freq: Once | INTRAVENOUS | Status: AC | PRN
  Administered 2022-05-20: 500 [IU]
  Filled 2022-05-20: qty 5

## 2022-05-20 MED ORDER — SODIUM CHLORIDE 0.9 % IV SOLN
1400.0000 mg | Freq: Once | INTRAVENOUS | Status: AC
  Administered 2022-05-20: 1399 mg via INTRAVENOUS
  Filled 2022-05-20: qty 36.8

## 2022-05-20 MED ORDER — SODIUM CHLORIDE 0.9 % IV SOLN
Freq: Once | INTRAVENOUS | Status: AC
  Filled 2022-05-20: qty 250

## 2022-05-20 MED ORDER — PACLITAXEL PROTEIN-BOUND CHEMO INJECTION 100 MG
110.0000 mg/m2 | Freq: Once | INTRAVENOUS | Status: AC
  Administered 2022-05-20: 175 mg via INTRAVENOUS
  Filled 2022-05-20: qty 35

## 2022-05-20 MED ORDER — PROCHLORPERAZINE MALEATE 10 MG PO TABS
10.0000 mg | ORAL_TABLET | Freq: Once | ORAL | Status: AC
  Administered 2022-05-20: 10 mg via ORAL

## 2022-05-20 NOTE — Patient Instructions (Signed)
[facility]  Discharge Instructions: Thank you for choosing [facility] to provide your oncology and hematology care.  If you have a lab appointment with the [facility], please go directly to the [facility] and check in at the registration area.  Wear comfortable clothing and clothing appropriate for easy access to any Portacath or PICC line.   We strive to give you quality time with your provider. You may need to reschedule your appointment if you arrive late (15 or more minutes).  Arriving late affects you and other patients whose appointments are after yours.  Also, if you miss three or more appointments without notifying the office, you may be dismissed from the clinic at the provider's discretion.      For prescription refill requests, have your pharmacy contact our office and allow 72 hours for refills to be completed.    To help prevent nausea and vomiting after your treatment, we encourage you to take your nausea medication as directed.  BELOW ARE SYMPTOMS THAT SHOULD BE REPORTED IMMEDIATELY: *FEVER GREATER THAN 100.4 F (38 C) OR HIGHER *CHILLS OR SWEATING *NAUSEA AND VOMITING THAT IS NOT CONTROLLED WITH YOUR NAUSEA MEDICATION *UNUSUAL SHORTNESS OF BREATH *UNUSUAL BRUISING OR BLEEDING *URINARY PROBLEMS (pain or burning when urinating, or frequent urination) *BOWEL PROBLEMS (unusual diarrhea, constipation, pain near the anus) TENDERNESS IN MOUTH AND THROAT WITH OR WITHOUT PRESENCE OF ULCERS (sore throat, sores in mouth, or a toothache) UNUSUAL RASH, SWELLING OR PAIN  UNUSUAL VAGINAL DISCHARGE OR ITCHING   Items with * indicate a potential emergency and should be followed up as soon as possible or go to the Emergency Department if any problems should occur.  Please show the CHEMOTHERAPY ALERT CARD or IMMUNOTHERAPY ALERT CARD at check-in to the Emergency Department and triage nurse.  Should you have questions after your visit or  need to cancel or reschedule your appointment, please contact [facility]  [phone] and follow the prompts.  Office hours are 8:00 a.m. to 4:30 p.m. Monday - Friday. Please note that voicemails left after 4:00 p.m. may not be returned until the following business day.  We are closed weekends and major holidays. You have access to a nurse at all times for urgent questions. Please call the main number to the clinic [phone] and follow the prompts.  For any non-urgent questions, you may also contact your provider using MyChart. We now offer e-Visits for anyone 66 and older to request care online for non-urgent symptoms. For details visit [email or web address].   Also download the MyChart app! Go to the app store, search "MyChart", open the app, select [facility], and log in with your MyChart username and password.

## 2022-05-20 NOTE — Progress Notes (Signed)
Mentioned tingling in her feet that she is starting to notice more.

## 2022-05-20 NOTE — Progress Notes (Signed)
[facility]  Telephone:([phone]) [phone] Fax:(336) [phone]  ID: Meredith Wilcox OB: Sep 07, 1940  MR#: [id number]  QIO#:[id number]  Patient Care Team: [name], MD as PCP - General (Family Medicine) Birder Robson, MD as Consulting Physician (Ophthalmology) Clent Jacks, RN as Oncology Nurse Navigator Grayland Ormond, Kathlene November, MD as Consulting Physician (Oncology)  CHIEF COMPLAINT: Stage IV pancreatic adenocarcinoma.  INTERVAL HISTORY: Patient returns to clinic today for further evaluation and consideration of cycle 2, day 8 of gemcitabine and Abraxane.  She felt increased weakness and fatigue for several days after her last treatment.  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  She has a good appetite, but continues to lose weight. She has no neurologic complaints. She has no chest pain, shortness of breath, cough, or hemoptysis.  She does not complain of abdominal pain today.  She has no nausea, vomiting, constipation, or diarrhea. She has no urinary complaints.  Patient offers no further specific complaints today.  REVIEW OF SYSTEMS:   Review of Systems  Constitutional:  Positive for malaise/fatigue and weight loss. Negative for chills and fever.  HENT:  Negative for congestion.   Respiratory: Negative.  Negative for cough, hemoptysis and shortness of breath.   Cardiovascular: Negative.  Negative for chest pain and leg swelling.  Gastrointestinal: Negative.  Negative for abdominal pain and heartburn.  Genitourinary: Negative.  Negative for dysuria.  Musculoskeletal: Negative.  Negative for back pain.  Skin: Negative.  Negative for rash.  Neurological:  Positive for sensory change and weakness. Negative for dizziness, seizures and headaches.  Psychiatric/Behavioral: Negative.  The patient is not nervous/anxious.     As per HPI. Otherwise, a complete review of systems is negative.  PAST MEDICAL HISTORY: Past Medical History:   Diagnosis Date   Anemia    H/O DURING PREGNANCY   Arthritis    Chronic kidney disease    KIDNEY PROBLEMS AROUND AGE 59   Geographic tongue    GERD (gastroesophageal reflux disease)    H/O NO MEDS   Helicobacter pylori gastrointestinal tract infection    Hyperlipidemia    Incontinence    Indigestion    Prolapse of vaginal walls    Right sided sciatica    Tachycardia    Urethral prolapse     PAST SURGICAL HISTORY: Past Surgical History:  Procedure Laterality Date   BREAST BIOPSY Left 10/11/2016   left breast stereo/ VASCULAR LESION   BREAST BIOPSY Left 12/02/2016   Procedure: NEEDLE LOCALIZATION;  Surgeon: [name], MD;  Location: [facility] ORS;  Service: General;  Laterality: Left;   BREAST EXCISIONAL BIOPSY Left 12/02/2016   left lumpectomy   BREAST LUMPECTOMY Left 12/02/2016   Procedure: EXCISION BREAST MASS;  Surgeon: [name], MD;  Location: [facility] ORS;  Service: General;  Laterality: Left;   DILATION AND CURETTAGE OF UTERUS     dnc     1983   ERCP N/A 07/07/2021   Procedure: ENDOSCOPIC RETROGRADE CHOLANGIOPANCREATOGRAPHY (ERCP);  Surgeon: [name], MD;  Location: [facility] ENDOSCOPY;  Service: Endoscopy;  Laterality: N/A;   ERCP N/A 10/01/2021   Procedure: ENDOSCOPIC RETROGRADE CHOLANGIOPANCREATOGRAPHY (ERCP);  Surgeon: [name], MD;  Location: [facility] ENDOSCOPY;  Service: Endoscopy;  Laterality: N/A;   fatty tumor removal Right    hand   IR IMAGING GUIDED PORT INSERTION  04/06/2022    FAMILY HISTORY: Family History  Problem Relation Age of Onset   Diabetes Mother    Hypertension Mother    Cardiomyopathy  Sister    Down syndrome Sister    Leukemia Brother    Alcohol abuse Son        youngest son   Asthma Son        youngest son   Rheum arthritis Sister    Drug abuse Child 53   Breast cancer Neg Hx    Kidney cancer Neg Hx    Bladder Cancer Neg Hx    Prostate cancer Neg Hx     ADVANCED DIRECTIVES (Y/N):  N  HEALTH  MAINTENANCE: Social History   Tobacco Use   Smoking status: Never   Smokeless tobacco: Never   Tobacco comments:    smoking cessation materials not required  Vaping Use   Vaping Use: Never used  Substance Use Topics   Alcohol use: No    Alcohol/week: 0.0 standard drinks of alcohol   Drug use: No     Colonoscopy:  PAP:  Bone density:  Lipid panel:  Allergies  Allergen Reactions   Tussionex Pennkinetic Er [Hydrocod Poli-Chlorphe Poli Er] Other (See Comments)    Real dizzy headed even after a while still walking every direction but straight, patient stated she stopped taking it because of that.   Hydrocodone     Light headed and dizziness    Current Outpatient Medications  Medication Sig Dispense Refill   amLODipine (NORVASC) 2.5 MG tablet Take 1 tablet (2.5 mg total) by mouth every evening. (Patient not taking: Reported on 03/26/2022) 90 tablet 0   lidocaine-prilocaine (EMLA) cream Apply to affected area once 30 g 3   ondansetron (ZOFRAN) 8 MG tablet Take 1 tablet (8 mg total) by mouth every 8 (eight) hours as needed for nausea or vomiting. 60 tablet 2   prochlorperazine (COMPAZINE) 10 MG tablet Take 1 tablet (10 mg total) by mouth every 6 (six) hours as needed for nausea or vomiting. 60 tablet 2   No current facility-administered medications for this visit.   Facility-Administered Medications Ordered in Other Visits  Medication Dose Route Frequency Provider Last Rate Last Admin   heparin lock flush 100 unit/mL  500 Units Intracatheter Once PRN Lloyd Huger, MD        OBJECTIVE: Vitals:   05/20/22 0927  BP: 126/62  Pulse: 83  Resp: 16  Temp: (!) 97.4 F (36.3 C)  SpO2: 100%      Body mass index is 17.94 kg/m.    ECOG FS:0 - Asymptomatic  General: Well-developed, well-nourished, no acute distress. Eyes: Pink conjunctiva, anicteric sclera. HEENT: Normocephalic, moist mucous membranes. Lungs: No audible wheezing or coughing. Heart: Regular rate and  rhythm. Abdomen: Soft, nontender, no obvious distention. Musculoskeletal: No edema, cyanosis, or clubbing. Neuro: Alert, answering all questions appropriately. Cranial nerves grossly intact. Skin: No rashes or petechiae noted. Psych: Normal affect.  LAB RESULTS:  Lab Results  Component Value Date   NA 132 (L) 05/20/2022   K 3.9 05/20/2022   CL 99 05/20/2022   CO2 25 05/20/2022   GLUCOSE 164 (H) 05/20/2022   BUN 10 05/20/2022   CREATININE 0.59 05/20/2022   CALCIUM 8.8 (L) 05/20/2022   PROT 7.7 05/20/2022   ALBUMIN 2.9 (L) 05/20/2022   AST 43 (H) 05/20/2022   ALT 36 05/20/2022   ALKPHOS 407 (H) 05/20/2022   BILITOT 0.5 05/20/2022   GFRNONAA >60 05/20/2022   GFRAA 84 08/13/2020    Lab Results  Component Value Date   WBC 8.5 05/20/2022   NEUTROABS 7.0 05/20/2022   HGB 8.5 (L) 05/20/2022  HCT 25.5 (L) 05/20/2022   MCV 88.9 05/20/2022   PLT 191 05/20/2022     STUDIES: No results found.  ASSESSMENT: Stage IV pancreatic adenocarcinoma.  PLAN:    1.  Stage IV pancreatic adenocarcinoma.  Repeat CT scan on January 19, 2022 reviewed independently with increasing size of suspicious pancreatic lesion now measuring 2.3 cm. Repeat EUS at [facility] finally confirmed adenocarcinoma.  Patient was given a referral to surgery for consideration of resection despite her advanced age, unfortunately PET scan results from March 09, 2022 reviewed independently and report as above revealing metastatic disease in patient's liver.  After lengthy discussion with the patient, she wishes to pursue systemic chemotherapy using weekly gemcitabine plus Abraxane on days 1, 8, and 15 with day 22 off. Her pretreatment CA 19-9 increased significantly to 22,951 which trended up to 34,774.  Given patient's increased weakness and fatigue as well as peripheral neuropathy, will dose reduce both Abraxane and gemcitabine approximately 10%.  Proceed with cycle 2, day 8 of treatment today.  Return to clinic  in 1 week for further evaluation and consideration of cycle 2, day 15.   2.  Hyperbilirubinemia: Resolved.  Patient now has a metal stent in place. 3.  Transaminitis: Resolved. 4.  Alkaline phosphatase elevation: Trending down to 407.  Monitor. 5.  Leukopenia: Resolved.   6.  Anemia: Hemoglobin can continues to slowly trend down and is now 8.5.  Monitor.   7.  Thrombocytopenia: Resolved.   8.  Hyponatremia: Chronic and unchanged.  Patient's sodium levels are 132. 9.  Weight loss: Patient declined appetite stimulant.  She has an appointment with dietary next week.  Patient expressed understanding and was in agreement with this plan. She also understands that She can call clinic at any time with any questions, concerns, or complaints.    Cancer Staging  Malignant tumor of head of pancreas [facility]) Staging form: Exocrine Pancreas, AJCC 8th Edition - Clinical stage from 03/26/2022: Stage IV (cT2, cN0, cM1) - Signed by Lloyd Huger, MD on 03/26/2022 Total positive nodes: 0   Lloyd Huger, MD   05/20/2022 1:35 PM

## 2022-05-21 ENCOUNTER — Other Ambulatory Visit: Payer: Self-pay

## 2022-05-23 ENCOUNTER — Other Ambulatory Visit: Payer: Self-pay

## 2022-05-25 ENCOUNTER — Other Ambulatory Visit: Payer: Self-pay

## 2022-05-27 ENCOUNTER — Inpatient Hospital Stay: Payer: Medicare Other

## 2022-05-27 ENCOUNTER — Inpatient Hospital Stay (HOSPITAL_BASED_OUTPATIENT_CLINIC_OR_DEPARTMENT_OTHER): Payer: Medicare Other | Admitting: Oncology

## 2022-05-27 ENCOUNTER — Encounter: Payer: Self-pay | Admitting: Oncology

## 2022-05-27 DIAGNOSIS — D649 Anemia, unspecified: Secondary | ICD-10-CM | POA: Diagnosis not present

## 2022-05-27 DIAGNOSIS — C250 Malignant neoplasm of head of pancreas: Secondary | ICD-10-CM | POA: Diagnosis not present

## 2022-05-27 DIAGNOSIS — D72829 Elevated white blood cell count, unspecified: Secondary | ICD-10-CM | POA: Diagnosis not present

## 2022-05-27 DIAGNOSIS — Z5111 Encounter for antineoplastic chemotherapy: Secondary | ICD-10-CM | POA: Diagnosis not present

## 2022-05-27 DIAGNOSIS — E871 Hypo-osmolality and hyponatremia: Secondary | ICD-10-CM | POA: Diagnosis not present

## 2022-05-27 DIAGNOSIS — D75839 Thrombocytosis, unspecified: Secondary | ICD-10-CM | POA: Diagnosis not present

## 2022-05-27 LAB — CBC WITH DIFFERENTIAL/PLATELET
Abs Immature Granulocytes: 0.07 10*3/uL (ref 0.00–0.07)
Basophils Absolute: 0 10*3/uL (ref 0.0–0.1)
Basophils Relative: 0 %
Eosinophils Absolute: 0 10*3/uL (ref 0.0–0.5)
Eosinophils Relative: 0 %
HCT: 24.5 % — ABNORMAL LOW (ref 36.0–46.0)
Hemoglobin: 8.2 g/dL — ABNORMAL LOW (ref 12.0–15.0)
Immature Granulocytes: 1 %
Lymphocytes Relative: 11 %
Lymphs Abs: 0.7 10*3/uL (ref 0.7–4.0)
MCH: 29 pg (ref 26.0–34.0)
MCHC: 33.5 g/dL (ref 30.0–36.0)
MCV: 86.6 fL (ref 80.0–100.0)
Monocytes Absolute: 0.5 10*3/uL (ref 0.1–1.0)
Monocytes Relative: 8 %
Neutro Abs: 4.9 10*3/uL (ref 1.7–7.7)
Neutrophils Relative %: 80 %
Platelets: 212 10*3/uL (ref 150–400)
RBC: 2.83 MIL/uL — ABNORMAL LOW (ref 3.87–5.11)
RDW: 15.3 % (ref 11.5–15.5)
WBC: 6.2 10*3/uL (ref 4.0–10.5)
nRBC: 0 % (ref 0.0–0.2)

## 2022-05-27 LAB — COMPREHENSIVE METABOLIC PANEL
ALT: 86 U/L — ABNORMAL HIGH (ref 0–44)
AST: 90 U/L — ABNORMAL HIGH (ref 15–41)
Albumin: 2.7 g/dL — ABNORMAL LOW (ref 3.5–5.0)
Alkaline Phosphatase: 942 U/L — ABNORMAL HIGH (ref 38–126)
Anion gap: 11 (ref 5–15)
BUN: 15 mg/dL (ref 8–23)
CO2: 27 mmol/L (ref 22–32)
Calcium: 8.7 mg/dL — ABNORMAL LOW (ref 8.9–10.3)
Chloride: 93 mmol/L — ABNORMAL LOW (ref 98–111)
Creatinine, Ser: 0.6 mg/dL (ref 0.44–1.00)
GFR, Estimated: >60 mL/min (ref >60)
Glucose, Bld: 131 mg/dL — ABNORMAL HIGH (ref 70–99)
Potassium: 3.2 mmol/L — ABNORMAL LOW (ref 3.5–5.1)
Sodium: 131 mmol/L — ABNORMAL LOW (ref 135–145)
Total Bilirubin: 2.6 mg/dL — ABNORMAL HIGH (ref 0.3–1.2)
Total Protein: 7.5 g/dL (ref 6.5–8.1)

## 2022-05-27 MED ORDER — SODIUM CHLORIDE 0.9 % IV SOLN
Freq: Once | INTRAVENOUS | Status: AC
  Filled 2022-05-27: qty 250

## 2022-05-27 MED ORDER — HEPARIN SOD (PORK) LOCK FLUSH 100 UNIT/ML IV SOLN
500.0000 [IU] | Freq: Once | INTRAVENOUS | Status: AC
  Administered 2022-05-27: 500 [IU] via INTRAVENOUS
  Filled 2022-05-27: qty 5

## 2022-05-27 NOTE — Patient Instructions (Signed)
[facility]  Discharge Instructions: Thank you for choosing [facility] to provide your oncology and hematology care.  If you have a lab appointment with the [facility], please go directly to the [facility] and check in at the registration area.  Wear comfortable clothing and clothing appropriate for easy access to any Portacath or PICC line.   We strive to give you quality time with your provider. You may need to reschedule your appointment if you arrive late (15 or more minutes).  Arriving late affects you and other patients whose appointments are after yours.  Also, if you miss three or more appointments without notifying the office, you may be dismissed from the clinic at the provider's discretion.      For prescription refill requests, have your pharmacy contact our office and allow 72 hours for refills to be completed.    Today you received IV fluids   To help prevent nausea and vomiting after your treatment, we encourage you to take your nausea medication as directed.  BELOW ARE SYMPTOMS THAT SHOULD BE REPORTED IMMEDIATELY: *FEVER GREATER THAN 100.4 F (38 C) OR HIGHER *CHILLS OR SWEATING *NAUSEA AND VOMITING THAT IS NOT CONTROLLED WITH YOUR NAUSEA MEDICATION *UNUSUAL SHORTNESS OF BREATH *UNUSUAL BRUISING OR BLEEDING *URINARY PROBLEMS (pain or burning when urinating, or frequent urination) *BOWEL PROBLEMS (unusual diarrhea, constipation, pain near the anus) TENDERNESS IN MOUTH AND THROAT WITH OR WITHOUT PRESENCE OF ULCERS (sore throat, sores in mouth, or a toothache) UNUSUAL RASH, SWELLING OR PAIN  UNUSUAL VAGINAL DISCHARGE OR ITCHING   Items with * indicate a potential emergency and should be followed up as soon as possible or go to the Emergency Department if any problems should occur.  Please show the CHEMOTHERAPY ALERT CARD or IMMUNOTHERAPY ALERT CARD at check-in to the Emergency Department and triage nurse.  Should you  have questions after your visit or need to cancel or reschedule your appointment, please contact [facility]  [phone] and follow the prompts.  Office hours are 8:00 a.m. to 4:30 p.m. Monday - Friday. Please note that voicemails left after 4:00 p.m. may not be returned until the following business day.  We are closed weekends and major holidays. You have access to a nurse at all times for urgent questions. Please call the main number to the clinic [phone] and follow the prompts.  For any non-urgent questions, you may also contact your provider using MyChart. We now offer e-Visits for anyone 24 and older to request care online for non-urgent symptoms. For details visit [email or web address].   Also download the MyChart app! Go to the app store, search "MyChart", open the app, select [facility] , and log in with your MyChart username and password.

## 2022-05-27 NOTE — Progress Notes (Signed)
Nutrition Assessment   Reason for Assessment:   Weight loss, poor appetite   ASSESSMENT:  82 year old female with stage IV pancreatic cancer.  Past medical history of CKD, GERD, HLD.  Patient receiving gemcitabine, abraxane.    Met with patient during IV fluids, no treatment today.  Patient reports that for the past week her appetite has been decreased.  Mainly drinking liquids.  Does not want to chew foods.  Says that she is drinking water, diluted juices, soups, and smoothie (kale and carrots unsure other ingredients).  Had issues with diarrhea for about 4 days but resolved.  Drinking Owyn shake. Likes cooked greens.  Does not eat pork and beef sits heavy on her stomach.  Reports that foods don't taste like the should.  Patient concerned about sodium and sugar in her diet.     Medications: zofran, compazine  Labs: Na 131, K 3.2, glucose 131   Anthropometrics:   Height: 65 inches Weight: 103 lb 14.4 oz  11/16/21 133 lb  BMI: 17  23% weight loss in the last 6 months, significant   Estimated Energy Needs  Kcals: 1400-1645 Protein: 70-82 g Fluid: 1400-1645   NUTRITION DIAGNOSIS: Inadequate oral intake related to cancer and cancer related treatment side effects as evidenced by 23% weight loss in the last 6 months and decreased appetite    INTERVENTION:  Recommend patient to liberalize diet at this time due to poor po intake and significant weight loss Encouraged a protein source being added to smoothie. Examples written down for patient (ie protein powder, nut butter, almond/cashew milk blend with protein, yogurt) Recommend patient increase Owyn shake 2-3 per day if able for added calories and protein Contact information provided  MONITORING, EVALUATION, GOAL: weight trends, intake   Next Visit: Thursday, Feb 22 during infusion  Akhilesh Sassone B. Zenia Resides, [place], [place] Registered Dietitian [phone]

## 2022-05-27 NOTE — Progress Notes (Signed)
Meredith Wilcox  [phone] Fax:(336) [phone]  ID: Meredith Wilcox OB: 12/04/1940  MR#: [id number]  [id number]  Patient Care Team: [name], MD as PCP - General (Family Medicine) Birder Robson, MD as Consulting Physician (Ophthalmology) Clent Jacks, RN as Oncology Nurse Navigator Grayland Ormond, Kathlene November, MD as Consulting Physician (Oncology)  CHIEF COMPLAINT: Stage IV pancreatic adenocarcinoma.  INTERVAL HISTORY: Patient returns to clinic today for further evaluation and consideration of cycle 2, day 15 of gemcitabine and Abraxane.  She continues to have increasing weakness and fatigue as well as declining performance status.  She reports 4 to 5 days of diarrhea after her last treatment.  She has a poor appetite and continues to lose weight. She has no neurologic complaints. She has no chest pain, shortness of breath, cough, or hemoptysis.  She does not complain of abdominal pain today.  She has no nausea, vomiting, or constipation. She has no urinary complaints.  Patient offers no further specific complaints today.  REVIEW OF SYSTEMS:   Review of Systems  Constitutional:  Positive for malaise/fatigue and weight loss. Negative for chills and fever.  HENT:  Negative for congestion.   Respiratory: Negative.  Negative for cough, hemoptysis and shortness of breath.   Cardiovascular: Negative.  Negative for chest pain and leg swelling.  Gastrointestinal:  Positive for diarrhea. Negative for abdominal pain and heartburn.  Genitourinary: Negative.  Negative for dysuria.  Musculoskeletal: Negative.  Negative for back pain.  Skin: Negative.  Negative for rash.  Neurological:  Positive for sensory change and weakness. Negative for dizziness, seizures and headaches.  Psychiatric/Behavioral: Negative.  The patient is not nervous/anxious.     As per HPI. Otherwise, a complete review of systems is negative.  PAST MEDICAL HISTORY: Past Medical History:   Diagnosis Date   Anemia    H/O DURING PREGNANCY   Arthritis    Chronic kidney disease    KIDNEY PROBLEMS AROUND AGE 20   Geographic tongue    GERD (gastroesophageal reflux disease)    H/O NO MEDS   Helicobacter pylori gastrointestinal tract infection    Hyperlipidemia    Incontinence    Indigestion    Prolapse of vaginal walls    Right sided sciatica    Tachycardia    Urethral prolapse     PAST SURGICAL HISTORY: Past Surgical History:  Procedure Laterality Date   BREAST BIOPSY Left 10/11/2016   left breast stereo/ VASCULAR LESION   BREAST BIOPSY Left 12/02/2016   Procedure: NEEDLE LOCALIZATION;  Surgeon: [name], MD;  Location: [facility] ORS;  Service: General;  Laterality: Left;   BREAST EXCISIONAL BIOPSY Left 12/02/2016   left lumpectomy   BREAST LUMPECTOMY Left 12/02/2016   Procedure: EXCISION BREAST MASS;  Surgeon: [name], MD;  Location: [facility] ORS;  Service: General;  Laterality: Left;   DILATION AND CURETTAGE OF UTERUS     dnc     1983   ERCP N/A 07/07/2021   Procedure: ENDOSCOPIC RETROGRADE CHOLANGIOPANCREATOGRAPHY (ERCP);  Surgeon: [name], MD;  Location[facility] ENDOSCOPY;  Service: Endoscopy;  Laterality: N/A;   ERCP N/A 10/01/2021   Procedure: ENDOSCOPIC RETROGRADE CHOLANGIOPANCREATOGRAPHY (ERCP);  Surgeon: [name], MD;  Location: [facility] ENDOSCOPY;  Service: Endoscopy;  Laterality: N/A;   fatty tumor removal Right    hand   IR IMAGING GUIDED PORT INSERTION  04/06/2022    FAMILY HISTORY: Family History  Problem Relation Age of Onset   Diabetes Mother    Hypertension Mother  Cardiomyopathy Sister    Down syndrome Sister    Leukemia Brother    Alcohol abuse Son        youngest son   Asthma Son        youngest son   Rheum arthritis Sister    Drug abuse Child 57   Breast cancer Neg Hx    Kidney cancer Neg Hx    Bladder Cancer Neg Hx    Prostate cancer Neg Hx     ADVANCED DIRECTIVES (Y/N):  N  HEALTH  MAINTENANCE: Social History   Tobacco Use   Smoking status: Never   Smokeless tobacco: Never   Tobacco comments:    smoking cessation materials not required  Vaping Use   Vaping Use: Never used  Substance Use Topics   Alcohol use: No    Alcohol/week: 0.0 standard drinks of alcohol   Drug use: No     Colonoscopy:  PAP:  Bone density:  Lipid panel:  Allergies  Allergen Reactions   Tussionex Pennkinetic Er [Hydrocod Poli-Chlorphe Poli Er] Other (See Comments)    Real dizzy headed even after a while still walking every direction but straight, patient stated she stopped taking it because of that.   Hydrocodone     Light headed and dizziness    Current Outpatient Medications  Medication Sig Dispense Refill   lidocaine-prilocaine (EMLA) cream Apply to affected area once 30 g 3   ondansetron (ZOFRAN) 8 MG tablet Take 1 tablet (8 mg total) by mouth every 8 (eight) hours as needed for nausea or vomiting. 60 tablet 2   prochlorperazine (COMPAZINE) 10 MG tablet Take 1 tablet (10 mg total) by mouth every 6 (six) hours as needed for nausea or vomiting. 60 tablet 2   amLODipine (NORVASC) 2.5 MG tablet Take 1 tablet (2.5 mg total) by mouth every evening. (Patient not taking: Reported on 03/26/2022) 90 tablet 0   No current facility-administered medications for this visit.    OBJECTIVE: Vitals:   05/27/22 0911  BP: (!) 118/56  Pulse: 93  Resp: 16  Temp: (!) 97.4 F (36.3 C)  SpO2: 100%      Body mass index is 17.29 kg/m.    ECOG FS:0 - Asymptomatic  General: Thin, no acute distress. Eyes: Pink conjunctiva, anicteric sclera. HEENT: Normocephalic, moist mucous membranes. Lungs: No audible wheezing or coughing. Heart: Regular rate and rhythm. Abdomen: Soft, nontender, no obvious distention. Musculoskeletal: No edema, cyanosis, or clubbing. Neuro: Alert, answering all questions appropriately. Cranial nerves grossly intact. Skin: No rashes or petechiae noted. Psych: Normal  affect.  LAB RESULTS:  Lab Results  Component Value Date   NA 131 (L) 05/27/2022   K 3.2 (L) 05/27/2022   CL 93 (L) 05/27/2022   CO2 27 05/27/2022   GLUCOSE 131 (H) 05/27/2022   BUN 15 05/27/2022   CREATININE 0.60 05/27/2022   CALCIUM 8.7 (L) 05/27/2022   PROT 7.5 05/27/2022   ALBUMIN 2.7 (L) 05/27/2022   AST 90 (H) 05/27/2022   ALT 86 (H) 05/27/2022   ALKPHOS 942 (H) 05/27/2022   BILITOT 2.6 (H) 05/27/2022   GFRNONAA >60 05/27/2022   GFRAA 84 08/13/2020    Lab Results  Component Value Date   WBC 6.2 05/27/2022   NEUTROABS 4.9 05/27/2022   HGB 8.2 (L) 05/27/2022   HCT 24.5 (L) 05/27/2022   MCV 86.6 05/27/2022   PLT 212 05/27/2022     STUDIES: No results found.  ASSESSMENT: Stage IV pancreatic adenocarcinoma.  PLAN:  1.  Stage IV pancreatic adenocarcinoma.  Repeat CT scan on January 19, 2022 reviewed independently with increasing size of suspicious pancreatic lesion now measuring 2.3 cm. Repeat EUS at [facility] finally confirmed adenocarcinoma.  Patient was given a referral to surgery for consideration of resection despite her advanced age, unfortunately PET scan results from March 09, 2022 reviewed independently and report as above revealing metastatic disease in patient's liver.  After lengthy discussion with the patient, she wishes to pursue systemic chemotherapy using weekly gemcitabine plus Abraxane on days 1, 8, and 15 with day 22 off. Her pretreatment CA 19-9 increased significantly to 22,951 which trended up to 34,774.  Given patient's increased weakness and fatigue and declining performance status, will discontinue treatment on day 15 for the remainder of the cycles.  Patient instead will receive IV fluids today.  Previously, both Abraxane and gemcitabine have been dose reduced approximately 10%.  Return to clinic in 1 week for further evaluation and consideration of cycle 2, day 1.   2.  Hyperbilirubinemia: Bilirubin up to 2.6.  Monitor.  Patient now has  a metal stent in place. 3.  Transaminitis: Worse today.  Hold treatment as above. 4.  Alkaline phosphatase elevation: Continues to trend up to 942. 5.  Leukopenia: Resolved.   6.  Anemia: Hemoglobin has trended down to 8.2.  Monitor.   7.  Thrombocytopenia: Resolved.   8.  Hyponatremia: Chronic and unchanged.  Patient sodium level is 131 today. 9.  Weight loss: Patient previously declined appetite stimulant.  She has an appointment with dietary today.  Patient expressed understanding and was in agreement with this plan. She also understands that She can call clinic at any time with any questions, concerns, or complaints.    Cancer Staging  Malignant tumor of head of pancreas [facility]) Staging form: Exocrine Pancreas, AJCC 8th Edition - Clinical stage from 03/26/2022: Stage IV (cT2, cN0, cM1) - Signed by Lloyd Huger, MD on 03/26/2022 Total positive nodes: 0   Lloyd Huger, MD   05/27/2022 4:06 PM

## 2022-05-27 NOTE — Progress Notes (Signed)
No chemo treatment today per MD, patient will receive 1L IVF

## 2022-05-27 NOTE — Progress Notes (Signed)
Had diarrhea for 4 days after tx. Appetite is getting less. Has been feeling "miserable"

## 2022-06-02 ENCOUNTER — Other Ambulatory Visit: Payer: Self-pay | Admitting: *Deleted

## 2022-06-02 DIAGNOSIS — C250 Malignant neoplasm of head of pancreas: Secondary | ICD-10-CM

## 2022-06-03 ENCOUNTER — Inpatient Hospital Stay: Payer: Medicare Other

## 2022-06-03 ENCOUNTER — Inpatient Hospital Stay (HOSPITAL_BASED_OUTPATIENT_CLINIC_OR_DEPARTMENT_OTHER): Payer: Medicare Other | Admitting: Oncology

## 2022-06-03 VITALS — BP 122/51 | HR 86 | Temp 95.9°F | Resp 16 | Wt 107.2 lb

## 2022-06-03 DIAGNOSIS — C250 Malignant neoplasm of head of pancreas: Secondary | ICD-10-CM | POA: Diagnosis not present

## 2022-06-03 DIAGNOSIS — D649 Anemia, unspecified: Secondary | ICD-10-CM | POA: Diagnosis not present

## 2022-06-03 DIAGNOSIS — D72829 Elevated white blood cell count, unspecified: Secondary | ICD-10-CM | POA: Diagnosis not present

## 2022-06-03 DIAGNOSIS — Z5111 Encounter for antineoplastic chemotherapy: Secondary | ICD-10-CM | POA: Diagnosis not present

## 2022-06-03 DIAGNOSIS — E871 Hypo-osmolality and hyponatremia: Secondary | ICD-10-CM | POA: Diagnosis not present

## 2022-06-03 DIAGNOSIS — D75839 Thrombocytosis, unspecified: Secondary | ICD-10-CM | POA: Diagnosis not present

## 2022-06-03 LAB — COMPREHENSIVE METABOLIC PANEL
ALT: 27 U/L (ref 0–44)
AST: 32 U/L (ref 15–41)
Albumin: 2.5 g/dL — ABNORMAL LOW (ref 3.5–5.0)
Alkaline Phosphatase: 493 U/L — ABNORMAL HIGH (ref 38–126)
Anion gap: 11 (ref 5–15)
BUN: 11 mg/dL (ref 8–23)
CO2: 26 mmol/L (ref 22–32)
Calcium: 8.7 mg/dL — ABNORMAL LOW (ref 8.9–10.3)
Chloride: 98 mmol/L (ref 98–111)
Creatinine, Ser: 0.76 mg/dL (ref 0.44–1.00)
GFR, Estimated: >60 mL/min (ref >60)
Glucose, Bld: 110 mg/dL — ABNORMAL HIGH (ref 70–99)
Potassium: 3.3 mmol/L — ABNORMAL LOW (ref 3.5–5.1)
Sodium: 135 mmol/L (ref 135–145)
Total Bilirubin: 1.2 mg/dL (ref 0.3–1.2)
Total Protein: 7.4 g/dL (ref 6.5–8.1)

## 2022-06-03 LAB — CBC WITH DIFFERENTIAL/PLATELET
Abs Immature Granulocytes: 0.2 10*3/uL — ABNORMAL HIGH (ref 0.00–0.07)
Basophils Absolute: 0.1 10*3/uL (ref 0.0–0.1)
Basophils Relative: 0 %
Eosinophils Absolute: 0 10*3/uL (ref 0.0–0.5)
Eosinophils Relative: 0 %
HCT: 25.5 % — ABNORMAL LOW (ref 36.0–46.0)
Hemoglobin: 8.4 g/dL — ABNORMAL LOW (ref 12.0–15.0)
Immature Granulocytes: 1 %
Lymphocytes Relative: 5 %
Lymphs Abs: 1.1 10*3/uL (ref 0.7–4.0)
MCH: 28.9 pg (ref 26.0–34.0)
MCHC: 32.9 g/dL (ref 30.0–36.0)
MCV: 87.6 fL (ref 80.0–100.0)
Monocytes Absolute: 1.1 10*3/uL — ABNORMAL HIGH (ref 0.1–1.0)
Monocytes Relative: 5 %
Neutro Abs: 18.8 10*3/uL — ABNORMAL HIGH (ref 1.7–7.7)
Neutrophils Relative %: 89 %
Platelets: 530 10*3/uL — ABNORMAL HIGH (ref 150–400)
RBC: 2.91 MIL/uL — ABNORMAL LOW (ref 3.87–5.11)
RDW: 16.5 % — ABNORMAL HIGH (ref 11.5–15.5)
WBC: 21.2 10*3/uL — ABNORMAL HIGH (ref 4.0–10.5)
nRBC: 0 % (ref 0.0–0.2)

## 2022-06-03 LAB — SAMPLE TO BLOOD BANK

## 2022-06-03 MED ORDER — PACLITAXEL PROTEIN-BOUND CHEMO INJECTION 100 MG
110.0000 mg/m2 | Freq: Once | INTRAVENOUS | Status: AC
  Administered 2022-06-03: 175 mg via INTRAVENOUS
  Filled 2022-06-03: qty 35

## 2022-06-03 MED ORDER — HEPARIN SOD (PORK) LOCK FLUSH 100 UNIT/ML IV SOLN
INTRAVENOUS | Status: AC
  Filled 2022-06-03: qty 5

## 2022-06-03 MED ORDER — SODIUM CHLORIDE 0.9 % IV SOLN
Freq: Once | INTRAVENOUS | Status: AC
  Filled 2022-06-03: qty 250

## 2022-06-03 MED ORDER — PROCHLORPERAZINE MALEATE 10 MG PO TABS
10.0000 mg | ORAL_TABLET | Freq: Once | ORAL | Status: AC
  Administered 2022-06-03: 10 mg via ORAL
  Filled 2022-06-03: qty 1

## 2022-06-03 MED ORDER — SODIUM CHLORIDE 0.9 % IV SOLN
900.0000 mg/m2 | Freq: Once | INTRAVENOUS | Status: AC
  Administered 2022-06-03: 1407 mg via INTRAVENOUS
  Filled 2022-06-03: qty 26.28

## 2022-06-03 MED ORDER — HEPARIN SOD (PORK) LOCK FLUSH 100 UNIT/ML IV SOLN
500.0000 [IU] | Freq: Once | INTRAVENOUS | Status: AC | PRN
  Administered 2022-06-03: 500 [IU]
  Filled 2022-06-03: qty 5

## 2022-06-03 NOTE — Progress Notes (Signed)
Nutrition Follow-up:  Patient with stage IV pancreatic cancer.  Patient receiving abraxane and gemcitabine.   Met with patient during infusion.  Patient continues to have taste alterations.  Wanted pinto beans last night but tasted bad when she ate them.  Drank a protein shake this am.  Wanted a pancake recently but did not ask her husband to go and get it.     Medications: reviewed  Labs: reviewed  Anthropometrics:   Weight 107 lb 3.2 oz  (bilateral extremity swelling) 103 lb 14.4 oz 11/16/21 133 lb     NUTRITION DIAGNOSIS: Inadequate oral intake continues    INTERVENTION:  Discussed strategies for taste change.  Handout provided Encouraged patient to purchase frozen pancakes to try.  Too weak to cook homemade pancake.       MONITORING, EVALUATION, GOAL: weight trends, intake   NEXT VISIT: Thursday, March 21 during infusion  Madilynn Montante B. Zenia Resides, [address], [place] Registered Dietitian [phone]

## 2022-06-03 NOTE — Patient Instructions (Signed)
[facility]  Discharge Instructions: Thank you for choosing [facility] to provide your oncology and hematology care.  If you have a lab appointment with the [facility], please go directly to the [facility] and check in at the registration area.  Wear comfortable clothing and clothing appropriate for easy access to any Portacath or PICC line.   We strive to give you quality time with your provider. You may need to reschedule your appointment if you arrive late (15 or more minutes).  Arriving late affects you and other patients whose appointments are after yours.  Also, if you miss three or more appointments without notifying the office, you may be dismissed from the clinic at the provider's discretion.      For prescription refill requests, have your pharmacy contact our office and allow 72 hours for refills to be completed.    Today you received the following chemotherapy and/or immunotherapy agents Abraxane, Gemzar      To help prevent nausea and vomiting after your treatment, we encourage you to take your nausea medication as directed.  BELOW ARE SYMPTOMS THAT SHOULD BE REPORTED IMMEDIATELY: *FEVER GREATER THAN 100.4 F (38 C) OR HIGHER *CHILLS OR SWEATING *NAUSEA AND VOMITING THAT IS NOT CONTROLLED WITH YOUR NAUSEA MEDICATION *UNUSUAL SHORTNESS OF BREATH *UNUSUAL BRUISING OR BLEEDING *URINARY PROBLEMS (pain or burning when urinating, or frequent urination) *BOWEL PROBLEMS (unusual diarrhea, constipation, pain near the anus) TENDERNESS IN MOUTH AND THROAT WITH OR WITHOUT PRESENCE OF ULCERS (sore throat, sores in mouth, or a toothache) UNUSUAL RASH, SWELLING OR PAIN  UNUSUAL VAGINAL DISCHARGE OR ITCHING   Items with * indicate a potential emergency and should be followed up as soon as possible or go to the Emergency Department if any problems should occur.  Please show the CHEMOTHERAPY ALERT CARD or IMMUNOTHERAPY ALERT CARD at  check-in to the Emergency Department and triage nurse.  Should you have questions after your visit or need to cancel or reschedule your appointment, please contact [facility]  [phone] and follow the prompts.  Office hours are 8:00 a.m. to 4:30 p.m. Monday - Friday. Please note that voicemails left after 4:00 p.m. may not be returned until the following business day.  We are closed weekends and major holidays. You have access to a nurse at all times for urgent questions. Please call the main number to the clinic [phone] and follow the prompts.  For any non-urgent questions, you may also contact your provider using MyChart. We now offer e-Visits for anyone 63 and older to request care online for non-urgent symptoms. For details visit [email or web address].   Also download the MyChart app! Go to the app store, search "MyChart", open the app, select [facility], and log in with your MyChart username and password.

## 2022-06-03 NOTE — Progress Notes (Signed)
[facility]  Telephone:(336) [phone] Fax:(336) [phone]  ID: Meredith Wilcox OB: January 23, 1941  MR#: [id number]  [id number]  Patient Care Team: [name], MD as PCP - General (Family Medicine) Birder Robson, MD as Consulting Physician (Ophthalmology) Clent Jacks, RN as Oncology Nurse Navigator Grayland Ormond, Kathlene November, MD as Consulting Physician (Oncology)  CHIEF COMPLAINT: Stage IV pancreatic adenocarcinoma.  INTERVAL HISTORY: Patient returns to clinic today for further evaluation and consideration of cycle 3, day 1 of gemcitabine and Abraxane.  She has chronic weakness and fatigue, but this mildly improved with her week off.  She continues to have a poor appetite.  She has no neurologic complaints. She has no chest pain, shortness of breath, cough, or hemoptysis.  She does not complain of abdominal pain today.  She has no nausea, vomiting, constipation or diarrhea. She has no urinary complaints.  Patient offers no further specific complaints today.  REVIEW OF SYSTEMS:   Review of Systems  Constitutional:  Positive for malaise/fatigue. Negative for chills, fever and weight loss.  HENT:  Negative for congestion.   Respiratory: Negative.  Negative for cough, hemoptysis and shortness of breath.   Cardiovascular:  Positive for leg swelling. Negative for chest pain.  Gastrointestinal: Negative.  Negative for abdominal pain, diarrhea and heartburn.  Genitourinary: Negative.  Negative for dysuria.  Musculoskeletal: Negative.  Negative for back pain.  Skin: Negative.  Negative for rash.  Neurological:  Positive for sensory change and weakness. Negative for dizziness, seizures and headaches.  Psychiatric/Behavioral: Negative.  The patient is not nervous/anxious.     As per HPI. Otherwise, a complete review of systems is negative.  PAST MEDICAL HISTORY: Past Medical History:  Diagnosis Date   Anemia    H/O DURING PREGNANCY   Arthritis    Chronic kidney disease     KIDNEY PROBLEMS AROUND AGE 60   Geographic tongue    GERD (gastroesophageal reflux disease)    H/O NO MEDS   Helicobacter pylori gastrointestinal tract infection    Hyperlipidemia    Incontinence    Indigestion    Prolapse of vaginal walls    Right sided sciatica    Tachycardia    Urethral prolapse     PAST SURGICAL HISTORY: Past Surgical History:  Procedure Laterality Date   BREAST BIOPSY Left 10/11/2016   left breast stereo/ VASCULAR LESION   BREAST BIOPSY Left 12/02/2016   Procedure: NEEDLE LOCALIZATION;  Surgeon: [name], MD;  Location: [facility] ORS;  Service: General;  Laterality: Left;   BREAST EXCISIONAL BIOPSY Left 12/02/2016   left lumpectomy   BREAST LUMPECTOMY Left 12/02/2016   Procedure: EXCISION BREAST MASS;  Surgeon: [name], MD;  Location: [facility] ORS;  Service: General;  Laterality: Left;   DILATION AND CURETTAGE OF UTERUS     dnc     1983   ERCP N/A 07/07/2021   Procedure: ENDOSCOPIC RETROGRADE CHOLANGIOPANCREATOGRAPHY (ERCP);  Surgeon: [name], MD;  Location: [facility] ENDOSCOPY;  Service: Endoscopy;  Laterality: N/A;   ERCP N/A 10/01/2021   Procedure: ENDOSCOPIC RETROGRADE CHOLANGIOPANCREATOGRAPHY (ERCP);  Surgeon: [name], MD;  Location: [facility] ENDOSCOPY;  Service: Endoscopy;  Laterality: N/A;   fatty tumor removal Right    hand   IR IMAGING GUIDED PORT INSERTION  04/06/2022    FAMILY HISTORY: Family History  Problem Relation Age of Onset   Diabetes Mother    Hypertension Mother    Cardiomyopathy Sister    Down syndrome Sister    Leukemia Brother  Alcohol abuse Son        youngest son   Asthma Son        youngest son   Rheum arthritis Sister    Drug abuse Child 23   Breast cancer Neg Hx    Kidney cancer Neg Hx    Bladder Cancer Neg Hx    Prostate cancer Neg Hx     ADVANCED DIRECTIVES (Y/N):  N  HEALTH MAINTENANCE: Social History   Tobacco Use   Smoking status: Never   Smokeless tobacco: Never   Tobacco  comments:    smoking cessation materials not required  Vaping Use   Vaping Use: Never used  Substance Use Topics   Alcohol use: No    Alcohol/week: 0.0 standard drinks of alcohol   Drug use: No     Colonoscopy:  PAP:  Bone density:  Lipid panel:  Allergies  Allergen Reactions   Tussionex Pennkinetic Er [Hydrocod Poli-Chlorphe Poli Er] Other (See Comments)    Real dizzy headed even after a while still walking every direction but straight, patient stated she stopped taking it because of that.   Hydrocodone     Light headed and dizziness    Current Outpatient Medications  Medication Sig Dispense Refill   amLODipine (NORVASC) 2.5 MG tablet Take 1 tablet (2.5 mg total) by mouth every evening. (Patient not taking: Reported on 03/26/2022) 90 tablet 0   lidocaine-prilocaine (EMLA) cream Apply to affected area once 30 g 3   ondansetron (ZOFRAN) 8 MG tablet Take 1 tablet (8 mg total) by mouth every 8 (eight) hours as needed for nausea or vomiting. 60 tablet 2   prochlorperazine (COMPAZINE) 10 MG tablet Take 1 tablet (10 mg total) by mouth every 6 (six) hours as needed for nausea or vomiting. 60 tablet 2   No current facility-administered medications for this visit.   Facility-Administered Medications Ordered in Other Visits  Medication Dose Route Frequency Provider Last Rate Last Admin   heparin lock flush 100 UNIT/ML injection             OBJECTIVE: There were no vitals filed for this visit.     There is no height or weight on file to calculate BMI.    ECOG FS:0 - Asymptomatic  General: Thin, no acute distress. Eyes: Pink conjunctiva, anicteric sclera. HEENT: Normocephalic, moist mucous membranes. Lungs: No audible wheezing or coughing. Heart: Regular rate and rhythm. Abdomen: Soft, nontender, no obvious distention. Musculoskeletal: 2+ bilateral lower extremity edema.   Neuro: Alert, answering all questions appropriately. Cranial nerves grossly intact. Skin: No rashes or  petechiae noted. Psych: Normal affect.   LAB RESULTS:  Lab Results  Component Value Date   NA 135 06/03/2022   K 3.3 (L) 06/03/2022   CL 98 06/03/2022   CO2 26 06/03/2022   GLUCOSE 110 (H) 06/03/2022   BUN 11 06/03/2022   CREATININE 0.76 06/03/2022   CALCIUM 8.7 (L) 06/03/2022   PROT 7.4 06/03/2022   ALBUMIN 2.5 (L) 06/03/2022   AST 32 06/03/2022   ALT 27 06/03/2022   ALKPHOS 493 (H) 06/03/2022   BILITOT 1.2 06/03/2022   GFRNONAA >60 06/03/2022   GFRAA 84 08/13/2020    Lab Results  Component Value Date   WBC 21.2 (H) 06/03/2022   NEUTROABS 18.8 (H) 06/03/2022   HGB 8.4 (L) 06/03/2022   HCT 25.5 (L) 06/03/2022   MCV 87.6 06/03/2022   PLT 530 (H) 06/03/2022     STUDIES: No results found.  ASSESSMENT: Stage IV  pancreatic adenocarcinoma.  PLAN:    Stage IV pancreatic adenocarcinoma.  Repeat CT scan on January 19, 2022 reviewed independently with increasing size of suspicious pancreatic lesion now measuring 2.3 cm. Repeat EUS at [facility] finally confirmed adenocarcinoma.  Patient was given a referral to surgery for consideration of resection despite her advanced age, unfortunately PET scan results from March 09, 2022 reviewed independently and report as above revealing metastatic disease in patient's liver.  After lengthy discussion with the patient, she wishes to pursue systemic chemotherapy using weekly gemcitabine plus Abraxane on days 1, 8, and 15 with day 22 off. Her pretreatment CA 19-9 increased significantly to 22,951 which trended up to 34,774.  Given patient's increased weakness and fatigue and declining performance status, will discontinue treatment on day 15 for the remainder of the cycles.  Proceed with cycle 3, day 1 of treatment. Previously, both Abraxane and gemcitabine have been dose reduced approximately 10%.  Return to clinic in 1 week for further evaluation and consideration of cycle 3, day 8. Hyperbilirubinemia: Resolved.  Patient now has a metal  stent in place. Transaminitis: Resolved.   Alkaline phosphatase elevation: Trending down and now 493. Leukocytosis: Likely reactive, monitor.   Anemia: Chronic and unchanged.  Patient's hemoglobin is 8.4. Thrombocytopenia: Patient now has a mild thrombocytosis.   Hyponatremia: Resolved. Weight loss: Patient now has slight weight gain.  Patient previously declined appetite stimulant.  Appreciate dietary input.  Patient expressed understanding and was in agreement with this plan. She also understands that She can call clinic at any time with any questions, concerns, or complaints.    Cancer Staging  Malignant tumor of head of pancreas [facility]) Staging form: Exocrine Pancreas, AJCC 8th Edition - Clinical stage from 03/26/2022: Stage IV (cT2, cN0, cM1) - Signed by Lloyd Huger, MD on 03/26/2022 Total positive nodes: 0   Lloyd Huger, MD   06/03/2022 4:14 PM

## 2022-06-05 LAB — CANCER ANTIGEN 19-9: CA 19-9: 16516 U/mL — ABNORMAL HIGH (ref 0–35)

## 2022-06-09 ENCOUNTER — Telehealth: Payer: Self-pay | Admitting: Oncology

## 2022-06-09 ENCOUNTER — Other Ambulatory Visit: Payer: Self-pay | Admitting: *Deleted

## 2022-06-09 DIAGNOSIS — C250 Malignant neoplasm of head of pancreas: Secondary | ICD-10-CM

## 2022-06-09 NOTE — Telephone Encounter (Signed)
Daughter Meredith Wilcox called that she found her mother on the floor this evening.  Per patient, she had a fall and did not have stressed to get up and was on the floor for couple of hours.  Patient reports weakness, decreased oral intake, has not had any oral intake for the past few days.  No nausea vomiting fever. I suggest patient to go to emergency room for further evaluation.  Daughter voices understanding that recommendation.  CC Dr. Ishmael Holter team to follow-up in AM.

## 2022-06-10 ENCOUNTER — Inpatient Hospital Stay
Admission: EM | Admit: 2022-06-10 | Discharge: 2022-06-16 | DRG: 682 | Disposition: A | Discharge status: Home Health | Payer: Medicare Other | Attending: Internal Medicine | Admitting: Internal Medicine

## 2022-06-10 ENCOUNTER — Observation Stay: Payer: Medicare Other

## 2022-06-10 ENCOUNTER — Other Ambulatory Visit: Payer: Self-pay | Admitting: Oncology

## 2022-06-10 ENCOUNTER — Other Ambulatory Visit: Payer: Medicare Other

## 2022-06-10 ENCOUNTER — Inpatient Hospital Stay: Payer: Medicare Other | Admitting: Oncology

## 2022-06-10 ENCOUNTER — Inpatient Hospital Stay: Payer: Medicare Other

## 2022-06-10 ENCOUNTER — Ambulatory Visit: Payer: Medicare Other

## 2022-06-10 ENCOUNTER — Encounter: Payer: Self-pay | Admitting: Internal Medicine

## 2022-06-10 ENCOUNTER — Other Ambulatory Visit: Payer: Self-pay

## 2022-06-10 ENCOUNTER — Ambulatory Visit: Payer: Medicare Other | Admitting: Oncology

## 2022-06-10 ENCOUNTER — Emergency Department: Payer: Medicare Other

## 2022-06-10 DIAGNOSIS — R4182 Altered mental status, unspecified: Secondary | ICD-10-CM | POA: Diagnosis not present

## 2022-06-10 DIAGNOSIS — D6959 Other secondary thrombocytopenia: Secondary | ICD-10-CM | POA: Diagnosis present

## 2022-06-10 DIAGNOSIS — N189 Chronic kidney disease, unspecified: Secondary | ICD-10-CM | POA: Diagnosis present

## 2022-06-10 DIAGNOSIS — Z8249 Family history of ischemic heart disease and other diseases of the circulatory system: Secondary | ICD-10-CM

## 2022-06-10 DIAGNOSIS — W19XXXA Unspecified fall, initial encounter: Secondary | ICD-10-CM | POA: Diagnosis not present

## 2022-06-10 DIAGNOSIS — E440 Moderate protein-calorie malnutrition: Secondary | ICD-10-CM

## 2022-06-10 DIAGNOSIS — D638 Anemia in other chronic diseases classified elsewhere: Secondary | ICD-10-CM | POA: Diagnosis not present

## 2022-06-10 DIAGNOSIS — D630 Anemia in neoplastic disease: Secondary | ICD-10-CM | POA: Diagnosis present

## 2022-06-10 DIAGNOSIS — R680 Hypothermia, not associated with low environmental temperature: Secondary | ICD-10-CM | POA: Diagnosis not present

## 2022-06-10 DIAGNOSIS — Z8279 Family history of other congenital malformations, deformations and chromosomal abnormalities: Secondary | ICD-10-CM | POA: Diagnosis not present

## 2022-06-10 DIAGNOSIS — R059 Cough, unspecified: Secondary | ICD-10-CM | POA: Diagnosis not present

## 2022-06-10 DIAGNOSIS — I10 Essential (primary) hypertension: Secondary | ICD-10-CM | POA: Diagnosis present

## 2022-06-10 DIAGNOSIS — D696 Thrombocytopenia, unspecified: Secondary | ICD-10-CM | POA: Diagnosis not present

## 2022-06-10 DIAGNOSIS — R531 Weakness: Secondary | ICD-10-CM

## 2022-06-10 DIAGNOSIS — E43 Unspecified severe protein-calorie malnutrition: Secondary | ICD-10-CM | POA: Diagnosis not present

## 2022-06-10 DIAGNOSIS — T451X5A Adverse effect of antineoplastic and immunosuppressive drugs, initial encounter: Secondary | ICD-10-CM | POA: Diagnosis present

## 2022-06-10 DIAGNOSIS — D649 Anemia, unspecified: Secondary | ICD-10-CM | POA: Diagnosis not present

## 2022-06-10 DIAGNOSIS — Z833 Family history of diabetes mellitus: Secondary | ICD-10-CM

## 2022-06-10 DIAGNOSIS — C250 Malignant neoplasm of head of pancreas: Secondary | ICD-10-CM | POA: Diagnosis present

## 2022-06-10 DIAGNOSIS — S199XXA Unspecified injury of neck, initial encounter: Secondary | ICD-10-CM | POA: Diagnosis not present

## 2022-06-10 DIAGNOSIS — N179 Acute kidney failure, unspecified: Secondary | ICD-10-CM | POA: Diagnosis not present

## 2022-06-10 DIAGNOSIS — Z515 Encounter for palliative care: Secondary | ICD-10-CM | POA: Diagnosis not present

## 2022-06-10 DIAGNOSIS — R64 Cachexia: Secondary | ICD-10-CM | POA: Diagnosis present

## 2022-06-10 DIAGNOSIS — E860 Dehydration: Secondary | ICD-10-CM | POA: Diagnosis not present

## 2022-06-10 DIAGNOSIS — R54 Age-related physical debility: Secondary | ICD-10-CM | POA: Diagnosis present

## 2022-06-10 DIAGNOSIS — Z66 Do not resuscitate: Secondary | ICD-10-CM | POA: Diagnosis present

## 2022-06-10 DIAGNOSIS — D6481 Anemia due to antineoplastic chemotherapy: Secondary | ICD-10-CM | POA: Diagnosis present

## 2022-06-10 DIAGNOSIS — Z79899 Other long term (current) drug therapy: Secondary | ICD-10-CM

## 2022-06-10 DIAGNOSIS — W1830XA Fall on same level, unspecified, initial encounter: Secondary | ICD-10-CM | POA: Diagnosis not present

## 2022-06-10 DIAGNOSIS — I129 Hypertensive chronic kidney disease with stage 1 through stage 4 chronic kidney disease, or unspecified chronic kidney disease: Secondary | ICD-10-CM | POA: Diagnosis present

## 2022-06-10 DIAGNOSIS — E785 Hyperlipidemia, unspecified: Secondary | ICD-10-CM | POA: Diagnosis present

## 2022-06-10 DIAGNOSIS — Z825 Family history of asthma and other chronic lower respiratory diseases: Secondary | ICD-10-CM | POA: Diagnosis not present

## 2022-06-10 DIAGNOSIS — Z681 Body mass index (BMI) 19 or less, adult: Secondary | ICD-10-CM

## 2022-06-10 DIAGNOSIS — Z806 Family history of leukemia: Secondary | ICD-10-CM

## 2022-06-10 DIAGNOSIS — E876 Hypokalemia: Secondary | ICD-10-CM | POA: Diagnosis not present

## 2022-06-10 DIAGNOSIS — Z885 Allergy status to narcotic agent status: Secondary | ICD-10-CM

## 2022-06-10 DIAGNOSIS — E871 Hypo-osmolality and hyponatremia: Secondary | ICD-10-CM | POA: Diagnosis present

## 2022-06-10 DIAGNOSIS — R627 Adult failure to thrive: Secondary | ICD-10-CM | POA: Diagnosis present

## 2022-06-10 DIAGNOSIS — E46 Unspecified protein-calorie malnutrition: Secondary | ICD-10-CM | POA: Diagnosis present

## 2022-06-10 DIAGNOSIS — K219 Gastro-esophageal reflux disease without esophagitis: Secondary | ICD-10-CM | POA: Diagnosis present

## 2022-06-10 DIAGNOSIS — Z8261 Family history of arthritis: Secondary | ICD-10-CM

## 2022-06-10 DIAGNOSIS — I959 Hypotension, unspecified: Secondary | ICD-10-CM | POA: Diagnosis not present

## 2022-06-10 DIAGNOSIS — R404 Transient alteration of awareness: Secondary | ICD-10-CM | POA: Diagnosis not present

## 2022-06-10 LAB — CBC WITH DIFFERENTIAL/PLATELET
Abs Immature Granulocytes: 0.1 10*3/uL — ABNORMAL HIGH (ref 0.00–0.07)
Basophils Absolute: 0 10*3/uL (ref 0.0–0.1)
Basophils Relative: 0 %
Eosinophils Absolute: 0 10*3/uL (ref 0.0–0.5)
Eosinophils Relative: 0 %
HCT: 23.7 % — ABNORMAL LOW (ref 36.0–46.0)
Hemoglobin: 7.6 g/dL — ABNORMAL LOW (ref 12.0–15.0)
Immature Granulocytes: 1 %
Lymphocytes Relative: 5 %
Lymphs Abs: 0.4 10*3/uL — ABNORMAL LOW (ref 0.7–4.0)
MCH: 28 pg (ref 26.0–34.0)
MCHC: 32.1 g/dL (ref 30.0–36.0)
MCV: 87.5 fL (ref 80.0–100.0)
Monocytes Absolute: 0.2 10*3/uL (ref 0.1–1.0)
Monocytes Relative: 2 %
Neutro Abs: 7 10*3/uL (ref 1.7–7.7)
Neutrophils Relative %: 92 %
Platelets: 109 10*3/uL — ABNORMAL LOW (ref 150–400)
RBC: 2.71 MIL/uL — ABNORMAL LOW (ref 3.87–5.11)
RDW: 16.6 % — ABNORMAL HIGH (ref 11.5–15.5)
Smear Review: NORMAL
WBC: 7.7 10*3/uL (ref 4.0–10.5)
nRBC: 0 % (ref 0.0–0.2)

## 2022-06-10 LAB — HEPATIC FUNCTION PANEL
ALT: 30 U/L (ref 0–44)
AST: 50 U/L — ABNORMAL HIGH (ref 15–41)
Albumin: 2.3 g/dL — ABNORMAL LOW (ref 3.5–5.0)
Alkaline Phosphatase: 252 U/L — ABNORMAL HIGH (ref 38–126)
Bilirubin, Direct: 0.8 mg/dL — ABNORMAL HIGH (ref 0.0–0.2)
Indirect Bilirubin: 0.7 mg/dL (ref 0.3–0.9)
Total Bilirubin: 1.5 mg/dL — ABNORMAL HIGH (ref 0.3–1.2)
Total Protein: 6.8 g/dL (ref 6.5–8.1)

## 2022-06-10 LAB — URINALYSIS, ROUTINE W REFLEX MICROSCOPIC
Bilirubin Urine: NEGATIVE
Glucose, UA: NEGATIVE mg/dL
Ketones, ur: 5 mg/dL — AB
Leukocytes,Ua: NEGATIVE
Nitrite: NEGATIVE
Protein, ur: 30 mg/dL — AB
Specific Gravity, Urine: 1.019 (ref 1.005–1.030)
pH: 5 (ref 5.0–8.0)

## 2022-06-10 LAB — TROPONIN I (HIGH SENSITIVITY)
Troponin I (High Sensitivity): 26 ng/L — ABNORMAL HIGH (ref <18)
Troponin I (High Sensitivity): 25 ng/L — ABNORMAL HIGH (ref <18)

## 2022-06-10 LAB — PHOSPHORUS: Phosphorus: 3.9 mg/dL (ref 2.5–4.6)

## 2022-06-10 LAB — CK: Total CK: 155 U/L (ref 38–234)

## 2022-06-10 LAB — IRON AND TIBC
Iron: 16 ug/dL — ABNORMAL LOW (ref 28–170)
Saturation Ratios: 14 % (ref 10.4–31.8)
TIBC: 112 ug/dL — ABNORMAL LOW (ref 250–450)
UIBC: 96 ug/dL

## 2022-06-10 LAB — BASIC METABOLIC PANEL
Anion gap: 15 (ref 5–15)
BUN: 43 mg/dL — ABNORMAL HIGH (ref 8–23)
CO2: 24 mmol/L (ref 22–32)
Calcium: 8.3 mg/dL — ABNORMAL LOW (ref 8.9–10.3)
Chloride: 99 mmol/L (ref 98–111)
Creatinine, Ser: 1.61 mg/dL — ABNORMAL HIGH (ref 0.44–1.00)
GFR, Estimated: 32 mL/min — ABNORMAL LOW (ref >60)
Glucose, Bld: 77 mg/dL (ref 70–99)
Potassium: 4.6 mmol/L (ref 3.5–5.1)
Sodium: 138 mmol/L (ref 135–145)

## 2022-06-10 LAB — LACTIC ACID, PLASMA: Lactic Acid, Venous: 1.4 mmol/L (ref 0.5–1.9)

## 2022-06-10 LAB — LIPASE, BLOOD: Lipase: 34 U/L (ref 11–51)

## 2022-06-10 LAB — AMMONIA: Ammonia: <10 umol/L (ref 9–35)

## 2022-06-10 LAB — FERRITIN: Ferritin: 948 ng/mL — ABNORMAL HIGH (ref 11–307)

## 2022-06-10 LAB — MAGNESIUM: Magnesium: 2.2 mg/dL (ref 1.7–2.4)

## 2022-06-10 MED ORDER — CHLORHEXIDINE GLUCONATE CLOTH 2 % EX PADS
6.0000 | MEDICATED_PAD | Freq: Every day | CUTANEOUS | Status: DC
  Administered 2022-06-11 – 2022-06-14 (×4): 6 via TOPICAL

## 2022-06-10 MED ORDER — FOLIC ACID 1 MG PO TABS: 1.0000 mg | ORAL_TABLET | Freq: Every day | ORAL | Status: DC

## 2022-06-10 MED ORDER — ONDANSETRON HCL 4 MG/2ML IJ SOLN: 4.0000 mg | Freq: Four times a day (QID) | INTRAMUSCULAR | Status: DC | PRN

## 2022-06-10 MED ORDER — POLYETHYLENE GLYCOL 3350 17 G PO PACK: 17.0000 g | PACK | Freq: Every day | ORAL | Status: DC | PRN

## 2022-06-10 MED ORDER — SODIUM CHLORIDE 0.9 % IV SOLN
1.0000 mg | Freq: Once | INTRAVENOUS | Status: AC
  Administered 2022-06-10: 1 mg via INTRAVENOUS
  Filled 2022-06-10: qty 0.2

## 2022-06-10 MED ORDER — THIAMINE HCL 100 MG/ML IJ SOLN
100.0000 mg | Freq: Every day | INTRAMUSCULAR | Status: DC
  Administered 2022-06-10 – 2022-06-16 (×7): 100 mg via INTRAVENOUS
  Filled 2022-06-10 (×7): qty 2

## 2022-06-10 MED ORDER — ONDANSETRON HCL 4 MG PO TABS: 4.0000 mg | ORAL_TABLET | Freq: Four times a day (QID) | ORAL | Status: DC | PRN

## 2022-06-10 MED ORDER — ADULT MULTIVITAMIN W/MINERALS CH
1.0000 | ORAL_TABLET | Freq: Every day | ORAL | Status: DC
  Administered 2022-06-15 – 2022-06-16 (×2): 1 via ORAL
  Filled 2022-06-10 (×5): qty 1

## 2022-06-10 MED ORDER — THIAMINE MONONITRATE 100 MG PO TABS: 100.0000 mg | ORAL_TABLET | Freq: Every day | ORAL | Status: DC

## 2022-06-10 MED ORDER — LACTATED RINGERS IV SOLN: INTRAVENOUS | Status: AC

## 2022-06-10 MED ORDER — LACTATED RINGERS IV BOLUS
1000.0000 mL | Freq: Once | INTRAVENOUS | Status: AC
  Administered 2022-06-10: 1000 mL via INTRAVENOUS

## 2022-06-10 MED ORDER — SODIUM CHLORIDE 0.9 % IV BOLUS
1000.0000 mL | Freq: Once | INTRAVENOUS | Status: AC
  Administered 2022-06-10: 1000 mL via INTRAVENOUS

## 2022-06-10 MED ORDER — SODIUM CHLORIDE 0.9% FLUSH
3.0000 mL | Freq: Two times a day (BID) | INTRAVENOUS | Status: DC
  Administered 2022-06-10 – 2022-06-16 (×11): 3 mL via INTRAVENOUS

## 2022-06-10 NOTE — ED Provider Notes (Signed)
[facility] Provider Note    Event Date/Time   First MD Initiated Contact with Patient 06/10/22 1050     (approximate)   History   Fall   HPI  Meredith Wilcox is a 82 y.o. female with history of stage IV pancreatic cancer, chronic kidney disease, GERD, and hyperlipidemia who presents with generalized weakness over the last several days.  The patient states that she was walking in her home 3 days ago (her husband states it was 2 days ago) when she suddenly got weak and fell onto her back.  She laid there for 2 hours because she felt too weak to get up.  Since that time she has been persistently weak and fatigued with no energy to do anything.  The patient denies any acute pain.  She has no fever or chills.  She denies any vomiting or diarrhea.  She has no chest pain or difficulty breathing.  I reviewed the past medical records.  The patient's most recent outpatient encounter was with Dr. Grayland Ormond from oncology on 2/22 for follow-up of her cancer.  The patient is receiving chemotherapy.   Physical Exam   Triage Vital Signs: ED Triage Vitals  Enc Vitals Group     BP 06/10/22 1057 (!) 139/58     Pulse Rate 06/10/22 1057 90     Resp 06/10/22 1057 17     Temp 06/10/22 1101 97.9 F (36.6 C)     Temp Source 06/10/22 1101 Oral     SpO2 06/10/22 1057 93 %     Weight 06/10/22 1056 106 lb (48.1 kg)     Height 06/10/22 1056 '5\' 5"'$  (1.651 m)     Head Circumference --      Peak Flow --      Pain Score 06/10/22 1055 0     Pain Loc --      Pain Edu? --      Excl. in [place]? --     Most recent vital signs: Vitals:   06/10/22 1400 06/10/22 1430  BP: 130/60 (!) 133/57  Pulse: 91 92  Resp: (!) 22 19  Temp:    SpO2: 98% 97%     General: Alert and oriented, weak and frail appearing. CV:  Good peripheral perfusion.  Resp:  Normal effort.  Lungs CTAB. Abd:  Soft with mild diffuse tenderness but no peritoneal signs.  No distention.  Other:  No jaundice or scleral  icterus.  Dry mucous membranes.  Full range of motion of both hips and knees.  EOMI.  PERRLA.  No facial droop.  Motor intact in all extremities.   ED Results / Procedures / Treatments   Labs (all labs ordered are listed, but only abnormal results are displayed) Labs Reviewed  CBC WITH DIFFERENTIAL/PLATELET - Abnormal; Notable for the following components:      Result Value   RBC 2.71 (*)    Hemoglobin 7.6 (*)    HCT 23.7 (*)    RDW 16.6 (*)    Platelets 109 (*)    Lymphs Abs 0.4 (*)    Abs Immature Granulocytes 0.10 (*)    All other components within normal limits  BASIC METABOLIC PANEL - Abnormal; Notable for the following components:   BUN 43 (*)    Creatinine, Ser 1.61 (*)    Calcium 8.3 (*)    GFR, Estimated 32 (*)    All other components within normal limits  HEPATIC FUNCTION PANEL - Abnormal; Notable for the following components:  Albumin 2.3 (*)    AST 50 (*)    Alkaline Phosphatase 252 (*)    Total Bilirubin 1.5 (*)    Bilirubin, Direct 0.8 (*)    All other components within normal limits  TROPONIN I (HIGH SENSITIVITY) - Abnormal; Notable for the following components:   Troponin I (High Sensitivity) 26 (*)    All other components within normal limits  TROPONIN I (HIGH SENSITIVITY) - Abnormal; Notable for the following components:   Troponin I (High Sensitivity) 25 (*)    All other components within normal limits  CK  LACTIC ACID, PLASMA  LIPASE, BLOOD  AMMONIA  LACTIC ACID, PLASMA  URINALYSIS, ROUTINE W REFLEX MICROSCOPIC     EKG  ED ECG REPORT I, Arta Silence, the attending physician, personally viewed and interpreted this ECG.  Date: 06/10/2022 EKG Time: 1100 Rate: 93 Rhythm: normal sinus rhythm QRS Axis: right axis Intervals: normal ST/T Wave abnormalities: Nonspecific ST abnormalities Narrative Interpretation: no evidence of acute ischemia    RADIOLOGY  Chest x-ray: I independently viewed and interpreted the images; there is no focal  consolidation or edema  PROCEDURES:  Critical Care performed: No  Procedures   MEDICATIONS ORDERED IN ED: Medications  sodium chloride 0.9 % bolus 1,000 mL (0 mLs Intravenous Stopped 06/10/22 1328)     IMPRESSION / MDM / ASSESSMENT AND PLAN / ED COURSE  I reviewed the triage vital signs and the nursing notes.  82 year old female with stage IV pancreatic cancer and other PMH as noted above presents with generalized weakness over the last few days after a fall, although the patient denies any focal pain or specific injuries.  Husband reports decreased p.o. intake.  On exam she is weak and frail appearing with normal vital signs, dry mucous membranes, and no other focal findings.  Differential diagnosis includes, but is not limited to, dehydration, low blood abnormality, other metabolic disturbance, worsening anemia, worsening cancer, hepatic encephalopathy, acute infection/sepsis, less likely cardiac etiology.  We will obtain lab workup, give a fluid bolus, and reassess.  Patient's presentation is most consistent with acute presentation with potential threat to life or bodily function.  The patient is on the cardiac monitor to evaluate for evidence of arrhythmia and/or significant heart rate changes.  ----------------------------------------- 3:16 PM on 06/10/2022 -----------------------------------------  Chest x-ray negative.  Lab workup reveals AKI as well as slightly worsened anemia from baseline.  Other labs are overall unremarkable.  Alkaline phosphatase and bilirubin are slightly elevated but this actually improves improved from prior labs.  Troponins are not significantly elevated.  The patient states she is feeling somewhat better after fluids although her tongue still appears quite dry and she is still likely clinically dehydrated.  Given this as well as the AKI, I consulted Dr. Charleen Kirks from the hospitalist service; based on her discussion she agrees to admit the patient.  I  also discussed with the patient about advanced directives and confirmed that she is full code at this time.  FINAL CLINICAL IMPRESSION(S) / ED DIAGNOSES   Final diagnoses:  AKI (acute kidney injury) ([place])  Generalized weakness     Rx / DC Orders   ED Discharge Orders     None        Note:  This document was prepared using Dragon voice recognition software and may include unintentional dictation errors.    Arta Silence, MD 06/10/22 1555

## 2022-06-10 NOTE — Assessment & Plan Note (Deleted)
Secondary to stage IV cancer undergoing chemotherapy with poor p.o. intake.  - Dietitian consulted - Folic acid and thiamine daily - Multivitamin daily - Daily weights

## 2022-06-10 NOTE — Assessment & Plan Note (Deleted)
Patient has a history of gradually worsening normocytic anemia, most likely secondary to chemotherapy.  No evidence of acute bleed on examination.  Discussed with family that with IV fluids, patient's hemoglobin may decline further and patient may require transfusion.  Patient and family are in agreement if needed.  - Transfuse for hemoglobin less than 7 - Repeat CBC in the a.m. - Iron panel pending

## 2022-06-10 NOTE — Assessment & Plan Note (Addendum)
Patient with profound weakness that is equal throughout, likely secondary to progressive stage IV cancer undergoing chemotherapy.  PT and OT consultation

## 2022-06-10 NOTE — ED Triage Notes (Signed)
Pt bib from home found laying on the floor by daughter around 10:30PM yesterday, unknown time frame how long pt's been on the floor. Husband in the house with pt states he saw pt last on Tuesday morning when pt went to her room after breakfast.  Daughter left patient on the floor because pt said "she's comfortable". Daughter found pt still on the floor today and called EMS. Pancreatic ca on treatment- last chemo last Thursday. AO x 4 per EMS, not on thinners.  Pt only complaining of cough, and pain to chest when coughing.  115/58 HR 68 98% RA CBG 99

## 2022-06-10 NOTE — ED Notes (Signed)
No repeat lactic per VO of MD

## 2022-06-10 NOTE — Assessment & Plan Note (Addendum)
Resolved with IV fluid. Creatinine 1.61 on presentation  - Continue IV fluids with the patient not eating.

## 2022-06-10 NOTE — Assessment & Plan Note (Addendum)
Likely secondary to chemotherapy.  Platelet count up at 132.

## 2022-06-10 NOTE — H&P (Signed)
History and Physical    Patient: Meredith Wilcox [id number] DOB: June 23, 1940 DOA: 06/10/2022 DOS: the patient was seen and examined on 06/10/2022 PCP: [name], MD  Patient coming from: Home  Chief Complaint:  Chief Complaint  Patient presents with   Fall   HPI: Meredith Wilcox is a 82 y.o. 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  Meredith Wilcox states that 2-3 days ago, she was walking in her living room to plug in a radio. When she bent over to pick up the cable, she felt herself falling down onto the ground.  She does not believe that she hit her head or lost consciousness.  Prior to the fall, she denies any dizziness, chest pain, shortness of breath or palpitations.  After the fall, she notes that she was so weak she was unable to stand up on her own.  She states generalized weakness has been ongoing and is worse after chemotherapy, which her last infusion was on 06/03/2022. She laid on the ground for at least 2 hours.  She cannot recall how she was able to stand up or if someone helped her stand up.  Her husband at bedside states he was not the one to help her up and he does not know how she got up off the ground.  Since that time, she has been experiencing increasing generalized weakness and decreased appetite.  She has not eaten in at least 2 days and has had very little water.  Due to this, she decided to come to the ER today.  At this time, Meredith Wilcox denies any fever, chills, nausea, vomiting, diarrhea, abdominal pain, chest pain, shortness of breath or palpitations.  She notes that she has had a cough since starting chemotherapy but her sputum states seems thicker and more difficult to swallow in the last few days.  Per chart review, patient's daughter found her mother on the ground on the evening of 2/28.  ED course: On arrival to the ED, patient was  normotensive at 139/58 with heart rate of 93.  She was saturating at 93% on room air.  She was afebrile at 97.9.  Initial workup notable for WBC of 7.7, hemoglobin of 7.6, platelets of 109, potassium of 4.6, bicarb 24, BUN 43, creatinine 1.61, alkaline phosphatase 252, albumin 2.3, AST 50, total bilirubin 1.5 and GFR 32. CK within normal limits at 155.  Initial troponin elevated at 26 with flat trend to 25.  Lactic acid within normal limits at 41.4.  Chest x-ray was obtained that did not show any acute cardiopulmonary disease.  Due to AKI and generalized weakness, [facility] contacted for admission.  Review of Systems: As mentioned in the history of present illness. All other systems reviewed and are negative.  Past Medical History:  Diagnosis Date   Anemia    H/O DURING PREGNANCY   Arthritis    Chronic kidney disease    KIDNEY PROBLEMS AROUND AGE 21   Geographic tongue    GERD (gastroesophageal reflux disease)    H/O NO MEDS   Helicobacter pylori gastrointestinal tract infection    Hyperlipidemia    Incontinence    Indigestion    Prolapse of vaginal walls    Right sided sciatica    Tachycardia    Urethral prolapse    Past Surgical History:  Procedure Laterality Date   BREAST BIOPSY Left 10/11/2016   left breast stereo/ VASCULAR LESION  BREAST BIOPSY Left 12/02/2016   Procedure: NEEDLE LOCALIZATION;  Surgeon: [name], MD;  Location: [facility] ORS;  Service: General;  Laterality: Left;   BREAST EXCISIONAL BIOPSY Left 12/02/2016   left lumpectomy   BREAST LUMPECTOMY Left 12/02/2016   Procedure: EXCISION BREAST MASS;  Surgeon: [name], MD;  Location: [facility] ORS;  Service: General;  Laterality: Left;   DILATION AND CURETTAGE OF UTERUS     dnc     1983   ERCP N/A 07/07/2021   Procedure: ENDOSCOPIC RETROGRADE CHOLANGIOPANCREATOGRAPHY (ERCP);  Surgeon: [name], MD;  Location: [facility] ENDOSCOPY;  Service: Endoscopy;  Laterality: N/A;   ERCP N/A 10/01/2021   Procedure:  ENDOSCOPIC RETROGRADE CHOLANGIOPANCREATOGRAPHY (ERCP);  Surgeon: [name], MD;  Location: [facility] ENDOSCOPY;  Service: Endoscopy;  Laterality: N/A;   fatty tumor removal Right    hand   IR IMAGING GUIDED PORT INSERTION  04/06/2022   Social History:  reports that she has never smoked. She has never used smokeless tobacco. She reports that she does not drink alcohol and does not use drugs.  Allergies  Allergen Reactions   Tussionex Pennkinetic Er [Hydrocod Poli-Chlorphe Poli Er] Other (See Comments)    Real dizzy headed even after a while still walking every direction but straight, patient stated she stopped taking it because of that.   Hydrocodone     Light headed and dizziness    Family History  Problem Relation Age of Onset   Diabetes Mother    Hypertension Mother    Cardiomyopathy Sister    Down syndrome Sister    Leukemia Brother    Alcohol abuse Son        youngest son   Asthma Son        youngest son   Rheum arthritis Sister    Drug abuse Child 30   Breast cancer Neg Hx    Kidney cancer Neg Hx    Bladder Cancer Neg Hx    Prostate cancer Neg Hx     Prior to Admission medications   Medication Sig Start Date End Date Taking? Authorizing Provider  amLODipine (NORVASC) 2.5 MG tablet Take 1 tablet (2.5 mg total) by mouth every evening. Patient not taking: Reported on 03/26/2022 08/13/21   [name], MD  lidocaine-prilocaine (EMLA) cream Apply to affected area once Patient not taking: Reported on 06/10/2022 03/26/22   Lloyd Huger, MD  ondansetron (ZOFRAN) 8 MG tablet Take 1 tablet (8 mg total) by mouth every 8 (eight) hours as needed for nausea or vomiting. Patient not taking: Reported on 06/10/2022 03/26/22   Lloyd Huger, MD  prochlorperazine (COMPAZINE) 10 MG tablet Take 1 tablet (10 mg total) by mouth every 6 (six) hours as needed for nausea or vomiting. Patient not taking: Reported on 06/10/2022 03/26/22   Lloyd Huger, MD    Physical  Exam: Vitals:   06/10/22 1500 06/10/22 1530 06/10/22 1600 06/10/22 1716  BP: (!) 140/52 (!) 133/54 (!) 130/51 (!) 131/52  Pulse: 92 93 99 99  Resp: 18 (!) 21 (!) 24 18  Temp:    98 F (36.7 C)  TempSrc:      SpO2: 96% 96% 96% 97%  Weight:      Height:       Physical Exam Vitals and nursing note reviewed.  Constitutional:      Appearance: She is cachectic.  HENT:     Head: Normocephalic and atraumatic.     Mouth/Throat:     Mouth: Mucous membranes  are dry.     Pharynx: Oropharynx is clear.  Eyes:     Extraocular Movements: Extraocular movements intact.     Conjunctiva/sclera: Conjunctivae normal.     Pupils: Pupils are equal, round, and reactive to light.  Cardiovascular:     Rate and Rhythm: Regular rhythm. Tachycardia present.     Heart sounds: No murmur heard. Pulmonary:     Effort: Pulmonary effort is normal.     Breath sounds: Normal breath sounds.  Abdominal:     General: Bowel sounds are normal.     Palpations: Abdomen is soft.     Tenderness: There is abdominal tenderness (Right upper quadrant). There is no right CVA tenderness, left CVA tenderness or guarding.  Musculoskeletal:     Comments: Trace pitting edema up to the ankles only  Skin:    General: Skin is warm and dry.     Coloration: Skin is not jaundiced.     Findings: No erythema, petechiae or rash. Rash is not purpuric.  Neurological:     Mental Status: She is alert.     Comments:  Alert and oriented x 3 No dysarthria or facial asymmetry.  Visual fields intact laterally Sensation intact throughout 3/5 strength throughout  Psychiatric:        Mood and Affect: Mood normal.        Behavior: Behavior normal.        Cognition and Memory: Memory is impaired.    Data Reviewed: CBC with WBC of 7.7, hemoglobin of 7.6, MCV of 87, platelets of 109 CMP with sodium 138, potassium 4.6, chloride 99, bicarb 24, BUN 43, creatinine 1.61, alkaline phosphatase 252, albumin 2.3, AST 50, total bilirubin 1.5 and GFR  of 32 Lactic acid within normal limits at 1.4 CK within normal limits at 155 Initial troponin elevated at 26 with flat trend 25  EKG personally reviewed.  Sinus rhythm with rate of 93.  Significant motion artifact.  J-point elevation in lateral leads.  No acute ST or T wave changes consistent with acute ischemia.  DG Chest Port 1 View  Result Date: 06/10/2022 CLINICAL DATA:  Cough EXAM: PORTABLE CHEST 1 VIEW COMPARISON:  CXR 05/18/21 FINDINGS: Right-sided central venous catheter with tip at the cavoatrial junction. No pleural effusion. No pneumothorax. Unchanged cardiac and mediastinal contours. No focal airspace opacity. No radiographically apparent displaced rib fractures. Visualized upper abdomen is unremarkable. Degenerative changes of the right AC joint. IMPRESSION: No focal airspace opacity. Electronically Signed   By: Marin Roberts M.D.   On: 06/10/2022 12:09    Results are pending, will review when available.  Assessment and Plan:  * AKI (acute kidney injury) ([place]) Patient presenting with AKI, likely multifactorial in the setting of poor p.o. intake for the last several days in addition to urinary retention as postvoid residual was over 300.  - Renal ultrasound ordered - Place Foley catheter given concern for bladder injury - IV fluids ordered - Repeat BMP in the a.m. - Avoid nephrotoxic agents - Strict in and out  Ground-level fall Patient states that she fell down after bending over to pick up a cable.  She denies any prodromal symptoms or any symptoms after the fall, and denies hitting her head or loss of consciousness.  It is concerning that she cannot recall how she got up off the ground.  Per chart review, her daughter found her on the ground after she was there for an unknown period of time.  Given memory deficit, will obtain an  MRI of the brain given history of stage IV cancer.  - MRI brain pending - PT/OT  Generalized weakness Patient with profound weakness that is  equal throughout, likely secondary to progressive stage IV cancer undergoing chemotherapy.  I am concerned that with her current weakness and cachexia, she would be unable to tolerate any further chemotherapy.  - Palliative care consulted; appreciate their recommendations - PT/OT  Malnutrition ([place]) Secondary to stage IV cancer undergoing chemotherapy with poor p.o. intake.  - Dietitian consulted - Folic acid and thiamine daily - Multivitamin daily - Daily weights  Malignant tumor of head of pancreas ([place]) Patient has a history of stage IV adenocarcinoma of the pancreas currently undergoing chemotherapy with Gemcitabine/Abraxane.   - Continue outpatient follow-up with oncology - Palliative care consulted  Normocytic anemia Patient has a history of gradually worsening normocytic anemia, most likely secondary to chemotherapy.  No evidence of acute bleed on examination.  Discussed with family that with IV fluids, patient's hemoglobin may decline further and patient may require transfusion.  Patient and family are in agreement if needed.  - Transfuse for hemoglobin less than 7 - Repeat CBC in the a.m. - Iron panel pending  Thrombocytopenia ([facility]) Approximately 1 week ago, patient's platelets were elevated at 530, currently 109, with normal morphology on smear.  Total bilirubin is mildly elevated with mixed picture that is not consistent with hemolysis.  No petechiae on exam.  Suspected to be secondary to chemotherapy.  - Hold pharmacological DVT prophylaxis until platelets stabilize - SCDs - Repeat CBC in the a.m.  Advance Care Planning:   Code Status: Full Code verified by patient  Consults: Palliative care  Family Communication: Patient's husband updated in person, and daughter updated via telephone  Severity of Illness: The appropriate patient status for this patient is OBSERVATION. Observation status is judged to be reasonable and necessary in order to provide the required  intensity of service to ensure the patient's safety. The patient's presenting symptoms, physical exam findings, and initial radiographic and laboratory data in the context of their medical condition is felt to place them at decreased risk for further clinical deterioration. Furthermore, it is anticipated that the patient will be medically stable for discharge from the hospital within 2 midnights of admission.   Author: Jose Persia, MD 06/10/2022 6:57 PM  For on call review [email or web address].

## 2022-06-10 NOTE — Assessment & Plan Note (Addendum)
Patient has a history of stage IV adenocarcinoma of the pancreas currently undergoing chemotherapy with Gemcitabine/Abraxane.  Case discussed with oncology and palliative care.  Patient still listed as a full code.  Overall prognosis poor especially if she does not eat.

## 2022-06-10 NOTE — Assessment & Plan Note (Addendum)
MRI did not show any acute stroke.  Question of chronic fracture deformity versus congenital segmental defect at the dens.  CT cervical spine ordered to further clarify.  PT and OT recommending home health

## 2022-06-11 ENCOUNTER — Other Ambulatory Visit: Payer: Self-pay | Admitting: Oncology

## 2022-06-11 ENCOUNTER — Observation Stay: Payer: Medicare Other

## 2022-06-11 DIAGNOSIS — Z515 Encounter for palliative care: Secondary | ICD-10-CM

## 2022-06-11 DIAGNOSIS — R64 Cachexia: Secondary | ICD-10-CM | POA: Diagnosis present

## 2022-06-11 DIAGNOSIS — N179 Acute kidney failure, unspecified: Secondary | ICD-10-CM | POA: Diagnosis not present

## 2022-06-11 DIAGNOSIS — R54 Age-related physical debility: Secondary | ICD-10-CM | POA: Diagnosis present

## 2022-06-11 DIAGNOSIS — Z833 Family history of diabetes mellitus: Secondary | ICD-10-CM | POA: Diagnosis not present

## 2022-06-11 DIAGNOSIS — D6959 Other secondary thrombocytopenia: Secondary | ICD-10-CM | POA: Diagnosis present

## 2022-06-11 DIAGNOSIS — S199XXA Unspecified injury of neck, initial encounter: Secondary | ICD-10-CM | POA: Diagnosis not present

## 2022-06-11 DIAGNOSIS — E43 Unspecified severe protein-calorie malnutrition: Secondary | ICD-10-CM | POA: Diagnosis present

## 2022-06-11 DIAGNOSIS — R531 Weakness: Secondary | ICD-10-CM | POA: Diagnosis not present

## 2022-06-11 DIAGNOSIS — Z806 Family history of leukemia: Secondary | ICD-10-CM | POA: Diagnosis not present

## 2022-06-11 DIAGNOSIS — Z66 Do not resuscitate: Secondary | ICD-10-CM | POA: Diagnosis present

## 2022-06-11 DIAGNOSIS — I10 Essential (primary) hypertension: Secondary | ICD-10-CM | POA: Diagnosis not present

## 2022-06-11 DIAGNOSIS — N189 Chronic kidney disease, unspecified: Secondary | ICD-10-CM | POA: Diagnosis present

## 2022-06-11 DIAGNOSIS — R059 Cough, unspecified: Secondary | ICD-10-CM | POA: Diagnosis not present

## 2022-06-11 DIAGNOSIS — E785 Hyperlipidemia, unspecified: Secondary | ICD-10-CM | POA: Diagnosis present

## 2022-06-11 DIAGNOSIS — E876 Hypokalemia: Secondary | ICD-10-CM | POA: Diagnosis present

## 2022-06-11 DIAGNOSIS — R404 Transient alteration of awareness: Secondary | ICD-10-CM | POA: Diagnosis not present

## 2022-06-11 DIAGNOSIS — D696 Thrombocytopenia, unspecified: Secondary | ICD-10-CM | POA: Diagnosis not present

## 2022-06-11 DIAGNOSIS — R627 Adult failure to thrive: Secondary | ICD-10-CM | POA: Diagnosis present

## 2022-06-11 DIAGNOSIS — Z8249 Family history of ischemic heart disease and other diseases of the circulatory system: Secondary | ICD-10-CM | POA: Diagnosis not present

## 2022-06-11 DIAGNOSIS — I129 Hypertensive chronic kidney disease with stage 1 through stage 4 chronic kidney disease, or unspecified chronic kidney disease: Secondary | ICD-10-CM | POA: Diagnosis present

## 2022-06-11 DIAGNOSIS — Z8279 Family history of other congenital malformations, deformations and chromosomal abnormalities: Secondary | ICD-10-CM | POA: Diagnosis not present

## 2022-06-11 DIAGNOSIS — W1830XA Fall on same level, unspecified, initial encounter: Secondary | ICD-10-CM | POA: Diagnosis present

## 2022-06-11 DIAGNOSIS — D630 Anemia in neoplastic disease: Secondary | ICD-10-CM | POA: Diagnosis present

## 2022-06-11 DIAGNOSIS — Z8261 Family history of arthritis: Secondary | ICD-10-CM | POA: Diagnosis not present

## 2022-06-11 DIAGNOSIS — D6481 Anemia due to antineoplastic chemotherapy: Secondary | ICD-10-CM | POA: Diagnosis present

## 2022-06-11 DIAGNOSIS — C250 Malignant neoplasm of head of pancreas: Secondary | ICD-10-CM

## 2022-06-11 DIAGNOSIS — E871 Hypo-osmolality and hyponatremia: Secondary | ICD-10-CM | POA: Diagnosis present

## 2022-06-11 DIAGNOSIS — Z681 Body mass index (BMI) 19 or less, adult: Secondary | ICD-10-CM | POA: Diagnosis not present

## 2022-06-11 DIAGNOSIS — D638 Anemia in other chronic diseases classified elsewhere: Secondary | ICD-10-CM | POA: Diagnosis not present

## 2022-06-11 DIAGNOSIS — Z825 Family history of asthma and other chronic lower respiratory diseases: Secondary | ICD-10-CM | POA: Diagnosis not present

## 2022-06-11 DIAGNOSIS — T451X5A Adverse effect of antineoplastic and immunosuppressive drugs, initial encounter: Secondary | ICD-10-CM | POA: Diagnosis present

## 2022-06-11 LAB — CBC WITH DIFFERENTIAL/PLATELET
Abs Immature Granulocytes: 0.03 10*3/uL (ref 0.00–0.07)
Basophils Absolute: 0.1 10*3/uL (ref 0.0–0.1)
Basophils Relative: 1 %
Eosinophils Absolute: 0 10*3/uL (ref 0.0–0.5)
Eosinophils Relative: 0 %
HCT: 22.8 % — ABNORMAL LOW (ref 36.0–46.0)
Hemoglobin: 7.6 g/dL — ABNORMAL LOW (ref 12.0–15.0)
Immature Granulocytes: 0 %
Lymphocytes Relative: 4 %
Lymphs Abs: 0.3 10*3/uL — ABNORMAL LOW (ref 0.7–4.0)
MCH: 28.3 pg (ref 26.0–34.0)
MCHC: 33.3 g/dL (ref 30.0–36.0)
MCV: 84.8 fL (ref 80.0–100.0)
Monocytes Absolute: 0.2 10*3/uL (ref 0.1–1.0)
Monocytes Relative: 3 %
Neutro Abs: 6.8 10*3/uL (ref 1.7–7.7)
Neutrophils Relative %: 92 %
Platelets: 132 10*3/uL — ABNORMAL LOW (ref 150–400)
RBC: 2.69 MIL/uL — ABNORMAL LOW (ref 3.87–5.11)
RDW: 16.5 % — ABNORMAL HIGH (ref 11.5–15.5)
Smear Review: NORMAL
WBC: 7.4 10*3/uL (ref 4.0–10.5)
nRBC: 0 % (ref 0.0–0.2)

## 2022-06-11 LAB — BASIC METABOLIC PANEL
Anion gap: 13 (ref 5–15)
BUN: 34 mg/dL — ABNORMAL HIGH (ref 8–23)
CO2: 24 mmol/L (ref 22–32)
Calcium: 8.3 mg/dL — ABNORMAL LOW (ref 8.9–10.3)
Chloride: 105 mmol/L (ref 98–111)
Creatinine, Ser: 1.23 mg/dL — ABNORMAL HIGH (ref 0.44–1.00)
GFR, Estimated: 44 mL/min — ABNORMAL LOW (ref >60)
Glucose, Bld: 65 mg/dL — ABNORMAL LOW (ref 70–99)
Potassium: 3.2 mmol/L — ABNORMAL LOW (ref 3.5–5.1)
Sodium: 142 mmol/L (ref 135–145)

## 2022-06-11 LAB — LACTIC ACID, PLASMA: Lactic Acid, Venous: 1.3 mmol/L (ref 0.5–1.9)

## 2022-06-11 LAB — VITAMIN B12: Vitamin B-12: 2652 pg/mL — ABNORMAL HIGH (ref 180–914)

## 2022-06-11 MED ORDER — POTASSIUM CHLORIDE 20 MEQ PO PACK
40.0000 meq | PACK | Freq: Once | ORAL | Status: DC
  Filled 2022-06-11: qty 2

## 2022-06-11 MED ORDER — KATE FARMS STANDARD 1.4 PO LIQD
325.0000 mL | Freq: Three times a day (TID) | ORAL | Status: DC
  Administered 2022-06-12 – 2022-06-13 (×6): 325 mL via ORAL
  Filled 2022-06-11: qty 325

## 2022-06-11 MED ORDER — ACETAMINOPHEN 10 MG/ML IV SOLN
1000.0000 mg | Freq: Once | INTRAVENOUS | Status: AC
  Administered 2022-06-11: 1000 mg via INTRAVENOUS
  Filled 2022-06-11: qty 100

## 2022-06-11 MED ORDER — POTASSIUM CHLORIDE 2 MEQ/ML IV SOLN
INTRAVENOUS | Status: DC
  Filled 2022-06-11: qty 1000

## 2022-06-11 MED ORDER — SODIUM CHLORIDE 0.9 % IV SOLN
3.0000 g | Freq: Two times a day (BID) | INTRAVENOUS | Status: DC
  Administered 2022-06-11 – 2022-06-12 (×2): 3 g via INTRAVENOUS
  Filled 2022-06-11: qty 8
  Filled 2022-06-11: qty 3
  Filled 2022-06-11: qty 8

## 2022-06-11 MED ORDER — KCL-LACTATED RINGERS-D5W 20 MEQ/L IV SOLN
INTRAVENOUS | Status: DC
  Filled 2022-06-11 (×3): qty 1000

## 2022-06-11 MED ORDER — SODIUM CHLORIDE 0.9 % IV SOLN
500.0000 mg | INTRAVENOUS | Status: AC
  Administered 2022-06-11 – 2022-06-13 (×3): 500 mg via INTRAVENOUS
  Filled 2022-06-11 (×3): qty 500

## 2022-06-11 MED ORDER — ENSURE ENLIVE PO LIQD: 237.0000 mL | Freq: Three times a day (TID) | ORAL | Status: DC

## 2022-06-11 NOTE — Assessment & Plan Note (Signed)
Stable and persistent hypokalemia with potassium at 3.2.  Magnesium within normal limit. -Replace potassium and monitor

## 2022-06-11 NOTE — Assessment & Plan Note (Addendum)
Hemoglobin stable but on the lower side at 7.6.  May end up needing a transfusion during the hospital course.  Likely secondary to chemotherapy

## 2022-06-11 NOTE — Progress Notes (Signed)
OT Cancellation Note  Patient Details Name: Rosland Pilcher MRN: [id number] DOB: 1940-08-04   Cancelled Treatment:    Reason Eval/Treat Not Completed: Fatigue/lethargy limiting ability to participate. Attempted OT evaluation this afternoon, but pt too fatigued to participate. Pt appears to be sleeping/resting soundly. She is able to open her eyes intermittently when asked to do so, but unable to maintain. At one point awakens enough to speak briefly, states that she is comfortable in bed and does not want to move. Will attempt OT at a later date, as appropriate.   Josiah Lobo 06/11/2022, 2:41 PM

## 2022-06-11 NOTE — Consult Note (Signed)
Patient not available for evaluation.  She is in radiology obtaining ultrasound of the kidney.  Case discussed with hospitalist.  Okay to discharge from an oncology standpoint pending physical therapy evaluation.  Please ensure patient keeps follow-up appointment on June 17, 2022 in the cancer center.  Call with questions.

## 2022-06-11 NOTE — Assessment & Plan Note (Addendum)
With failure to thrive.  Palliative care was consulted at patient's family would like to continue current level of care.  They were also inquiring about feeding tube. I discussed today with patient and she does not want any feeding tube

## 2022-06-11 NOTE — Progress Notes (Signed)
PT Cancellation Note  Patient Details Name: Meredith Wilcox MRN: [id number] DOB: 12-10-1940   Cancelled Treatment:    Reason Eval/Treat Not Completed:  (Consult received and chart reviewed.  Patient returned from [name], but sleeping soundly upon arrival to room.  Opens eyes and responds briefly with consistent cuing, but unable to maintain for participation with session. Will re-attempt at later time/date.)  Nalu Troublefield H. Owens Shark, PT, DPT, NCS 06/11/22, 11:00 AM [phone]

## 2022-06-11 NOTE — Progress Notes (Signed)
SLP Cancellation Note  Patient Details Name: Meredith Wilcox MRN: [id number] DOB: [birth year]   Cancelled treatment:       Reason Eval/Treat Not Completed: Fatigue/lethargy limiting ability to participate  Pt too fatigued for evaluation. Will follow up at next available opportunity.   Trexton Escamilla B. Rutherford Nail, M.S., CCC-SLP, [place] Pathologist Certified Brain Injury [place]  [facility] [phone] Ascom [phone] Fax [phone]  Stormy Fabian 06/11/2022, 5:28 PM

## 2022-06-11 NOTE — Inpatient Diabetes Management (Signed)
Inpatient Diabetes Program Recommendations  AACE/ADA: New Consensus Statement on Inpatient Glycemic Control (2015)  Target Ranges:  Prepandial:   less than 140 mg/dL      Peak postprandial:   less than 180 mg/dL (1-2 hours)      Critically ill patients:  140 - 180 mg/dL   Lab Results  Component Value Date   GLUCAP 96 03/08/2022    Review of Glycemic Control  Latest Reference Range & Units 06/11/22 04:17  Glucose 70 - 99 mg/dL 65 (L)  (L): Data is abnormally low Diabetes history: No DM noted Outpatient Diabetes medications: none Current orders for Inpatient glycemic control: none  Inpatient Diabetes Program Recommendations:    AM glucose serum 65 mg/dL. May want to consider adding CBGs TID if appropriate.   Thanks, Bronson Curb, MSN, RNC-OB Diabetes Coordinator [phone] (8a-5p)

## 2022-06-11 NOTE — Hospital Course (Signed)
82 y.o. 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   Meredith Wilcox states that 2-3 days ago, she was walking in her living room to plug in a radio. When she bent over to pick up the cable, she felt herself falling down onto the ground.  She does not believe that she hit her head or lost consciousness.  Prior to the fall, she denies any dizziness, chest pain, shortness of breath or palpitations.  After the fall, she notes that she was so weak she was unable to stand up on her own.  She states generalized weakness has been ongoing and is worse after chemotherapy, which her last infusion was on 06/03/2022. She laid on the ground for at least 2 hours.  She cannot recall how she was able to stand up or if someone helped her stand up.  Her husband at bedside states he was not the one to help her up and he does not know how she got up off the ground.  Since that time, she has been experiencing increasing generalized weakness and decreased appetite.  She has not eaten in at least 2 days and has had very little water.  Due to this, she decided to come to the ER today.  At this time, Meredith Wilcox denies any fever, chills, nausea, vomiting, diarrhea, abdominal pain, chest pain, shortness of breath or palpitations.  She notes that she has had a cough since starting chemotherapy but her sputum states seems thicker and more difficult to swallow in the last few days.   Per chart review, patient's daughter found her mother on the ground on the evening of 2/28.  3/1.  Patient not eating.  I will continue IV fluids.  Awaiting palliative care consultation.  Repeat chest x-ray to see if pneumonia.  3/2: Mild hypothermia this morning, maximum temperature recorded was 101 over the past 24 hours.  Repeat chest x-ray with bilateral interstitial opacities, which may represent pulmonary venous congestion or  atypical infection.  There was also noted to have a retrocardiac opacity which can be pneumonia.  CT cervical spine was negative for any acute abnormality.  Hemoglobin decreased to 6.8, BMP with mild hyponatremia at 146, potassium low at 3.2, AKI resolved.  Continuing IV fluid. Palliative care with current scope of care, family to decide about CODE STATUS. Had another discussion and they will continue to keep current level of care, 1 unit of PRBC ordered.  Family was also inquiring about tube feeding.  Patient is very high risk for deterioration and mortality.

## 2022-06-11 NOTE — Progress Notes (Addendum)
Initial Nutrition Assessment  DOCUMENTATION CODES:   Underweight, Severe malnutrition in context of chronic illness  INTERVENTION:   [employer] Standard 1.4 PO TID, each supplement provides 455 kcal and 20 grams protein -MVI with minerals daily -Feeding assistance with meals -Palliative care consult pending; if aggressive care is desired, recommend artifical means of nutrition and hydration (ex PEG)  NUTRITION DIAGNOSIS:   Severe Malnutrition related to chronic illness (stage IV pancreatic cancer) as evidenced by severe fat depletion, severe muscle depletion, percent weight loss.  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  Pt admitted with AKI and ground level fall.   Reviewed I/O's: +2.3 L x 24 hours  UOP: 375 ml x 24 hours  Per oncology notes, pt has appointment on 06/17/22 at [facility].   Pt is followed by RD at [facility]; pt has had decreased oral intake over the past 3-4 weeks, complaining of taste changes. Pt consumes mainly liquids including water, diluted juices, soups, homemade smoothies, and Owyn shakes.   Pt just returned from ultrasound at time of visit. Pt aroused very briefly and responded "uh-huh" to most questions asked. Pt is very weak and frail; per [place] RD notes, pt has been unable to prepare meals herself at home secondary to weakness. Noted breakfast tray on counter top untouched (likely due to pt being out of room when delivered). Noted orders for SLP eval pending.    Reviewed wt hx; pt has experienced a 7.5% wt loss over the past 2 months, which is significant for time frame. Pt with  mild edema, which may be masking further weight loss as well as fat and muscle depletions.   Palliative care consult ordered for goals of care. If aggressive treatment is desired, recommend alternative means of nutrition/ hydration (ex PEG), if this is within pt's goals of care secondary to poor oral intake and malnutrition.   Since pt likely prefers plan based supplements secondary to using [employer] at home, will trial [facility] as pt preferred supplement is not on formulary.   Case and recommendations discussed with MD, palliative care, and cancer center RD.   Medications reviewed and include thiamine and potassium chloride.   Labs reviewed: K: 3.2, CBGS: 96 (inpatient orders for glycemic control are none).    NUTRITION - FOCUSED PHYSICAL EXAM:  Flowsheet Row Most Recent Value  Orbital Region Severe depletion  Upper Arm Region Severe depletion  Thoracic and Lumbar Region Severe depletion  Buccal Region Severe depletion  Temple Region Severe depletion  Clavicle Bone Region Severe depletion  Clavicle and Acromion Bone Region Severe depletion  Scapular Bone Region Severe depletion  Dorsal Hand Severe depletion  Patellar Region Moderate depletion  Anterior Thigh Region Moderate depletion  Posterior Calf Region Moderate depletion  Edema (RD Assessment) Mild  Hair Reviewed  Eyes Reviewed  Mouth Reviewed  Skin Reviewed  Nails Reviewed       Diet Order:   Diet Order             DIET DYS 3 Room service appropriate? Yes; Fluid consistency: Thin  Diet effective now  EDUCATION NEEDS:   No education needs have been identified at this time  Skin:  Skin Assessment: Reviewed RN Assessment  Last BM:  06/11/22 (type 6)  Height:   Ht Readings from Last 1 Encounters:  06/10/22 '5\' 5"'$  (1.651 m)    Weight:   Wt Readings from Last 1 Encounters:  06/11/22 48 kg    Ideal Body Weight:  56.8 kg  BMI:  Body mass index is 17.61 kg/m.  Estimated  Nutritional Needs:   Kcal:  1900-2100  Protein:  95-110 grams  Fluid:  > 1.9 L    Loistine Chance, RD, LDN, [place] Registered Dietitian II Certified Diabetes Care and Education Specialist Please refer to [facility] for RD and/or RD on-call/weekend/after hours pager

## 2022-06-11 NOTE — Consult Note (Signed)
[place] at [facility] [phone]) [phone] [phone]   Name: Meredith Wilcox Date: 06/11/2022 MRN: [id number]  DOB: Dec 21, 1940  Patient Care Team: [name], MD as PCP - General (Family Medicine) Birder Robson, MD as Consulting Physician (Ophthalmology) Clent Jacks, RN as Oncology Nurse Navigator Grayland Ormond, Kathlene November, MD as Consulting Physician (Oncology)    REASON FOR CONSULTATION: Meredith Wilcox is a 82 y.o. 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  Patient was admitted to hospital on 06/10/2022 after having a fall at home.  She is on treatment for possible pneumonia.  Palliative care was consulted to address goals.  SOCIAL HISTORY:     reports that she has never smoked. She has never used smokeless tobacco. She reports that she does not drink alcohol and does not use drugs.  Patient is married lives at home with her husband.  She has a daughter and two sons.  ADVANCE DIRECTIVES:  Not on file  CODE STATUS: Full code  PAST MEDICAL HISTORY: Past Medical History:  Diagnosis Date   Anemia    H/O DURING PREGNANCY   Arthritis    Chronic kidney disease    KIDNEY PROBLEMS AROUND AGE 89   Geographic tongue    GERD (gastroesophageal reflux disease)    H/O NO MEDS   Helicobacter pylori gastrointestinal tract infection    Hyperlipidemia    Incontinence    Indigestion    Prolapse of vaginal walls    Right sided sciatica    Tachycardia    Urethral prolapse     PAST SURGICAL HISTORY:  Past Surgical History:  Procedure Laterality Date   BREAST BIOPSY Left 10/11/2016   left breast stereo/ VASCULAR LESION   BREAST BIOPSY Left 12/02/2016   Procedure: NEEDLE LOCALIZATION;  Surgeon: [name], MD;  Location: [facility] ORS;  Service: General;  Laterality: Left;   BREAST EXCISIONAL BIOPSY Left 12/02/2016   left lumpectomy    BREAST LUMPECTOMY Left 12/02/2016   Procedure: EXCISION BREAST MASS;  Surgeon: [name], MD;  Location: [facility] ORS;  Service: General;  Laterality: Left;   DILATION AND CURETTAGE OF UTERUS     dnc     1983   ERCP N/A 07/07/2021   Procedure: ENDOSCOPIC RETROGRADE CHOLANGIOPANCREATOGRAPHY (ERCP);  Surgeon: [name], MD;  Location: [facility] ENDOSCOPY;  Service: Endoscopy;  Laterality: N/A;   ERCP N/A 10/01/2021   Procedure: ENDOSCOPIC RETROGRADE CHOLANGIOPANCREATOGRAPHY (ERCP);  Surgeon: [name], MD;  Location: [facility] ENDOSCOPY;  Service: Endoscopy;  Laterality: N/A;   fatty tumor removal Right    hand   IR IMAGING GUIDED PORT INSERTION  04/06/2022    HEMATOLOGY/ONCOLOGY HISTORY:  Oncology History  Malignant tumor of head of pancreas ([place])  03/15/2022 Initial Diagnosis   Malignant tumor of head of pancreas ([place])   03/26/2022 Cancer Staging   Staging form: Exocrine Pancreas, AJCC 8th Edition - Clinical stage from 03/26/2022: Stage IV (cT2, cN0, cM1) - Signed by Lloyd Huger, MD on 03/26/2022 Total positive nodes: 0   04/08/2022 -  Chemotherapy   Patient is on Treatment Plan : PANCREATIC Abraxane D1,8,15 + Gemcitabine D1,8,15 q28d       ALLERGIES:  is allergic to tussionex pennkinetic er [hydrocod poli-chlorphe poli er] and hydrocodone.  MEDICATIONS:  Current Facility-Administered Medications  Medication Dose Route Frequency Provider Last Rate Last Admin   Ampicillin-Sulbactam (UNASYN) 3 g in sodium chloride 0.9 % 100 mL IVPB  3 g Intravenous Q12H Noralee Space, RPH   Stopped at 06/11/22 1355   azithromycin (ZITHROMAX) 500 mg in sodium chloride 0.9 % 250 mL IVPB  500 mg Intravenous Q24H Loletha Grayer, MD   Stopped at 06/11/22 1506   Chlorhexidine Gluconate Cloth 2 % PADS 6 each  6 each Topical Q0600 Jose Persia, MD   6 each at 06/11/22 0539   dextrose 5% in lactated ringers with KCl 20 mEq/L infusion   Intravenous Continuous Loletha Grayer, MD 50  mL/hr at 06/11/22 1515 Infusion Verify at 06/11/22 1515   feeding supplement (KATE FARMS STANDARD 1.4) liquid 325 mL  325 mL Oral TID BM Loletha Grayer, MD       multivitamin with minerals tablet 1 tablet  1 tablet Oral Daily Jose Persia, MD       ondansetron (ZOFRAN) tablet 4 mg  4 mg Oral Q6H PRN Jose Persia, MD       Or   ondansetron (ZOFRAN) injection 4 mg  4 mg Intravenous Q6H PRN Jose Persia, MD       potassium chloride (KLOR-CON) packet 40 mEq  40 mEq Oral Once Loletha Grayer, MD       sodium chloride flush (NS) 0.9 % injection 3 mL  3 mL Intravenous Q12H Jose Persia, MD   3 mL at 06/11/22 0953   thiamine (VITAMIN B1) injection 100 mg  100 mg Intravenous Daily Jose Persia, MD   100 mg at 06/11/22 0953    VITAL SIGNS: BP 135/65 (BP Location: Right Arm)   Pulse (!) 106   Temp 99.8 F (37.7 C) (Oral)   Resp 18   Ht '5\' 5"'$  (1.651 m)   Wt 105 lb 13.1 oz (48 kg)   SpO2 96%   BMI 17.61 kg/m  Filed Weights   06/10/22 1056 06/11/22 0705  Weight: 106 lb (48.1 kg) 105 lb 13.1 oz (48 kg)    Estimated body mass index is 17.61 kg/m as calculated from the following:   Height as of this encounter: '5\' 5"'$  (1.651 m).   Weight as of this encounter: 105 lb 13.1 oz (48 kg).  LABS: CBC:    Component Value Date/Time   WBC 7.4 06/11/2022 0417   HGB 7.6 (L) 06/11/2022 0417   HCT 22.8 (L) 06/11/2022 0417   PLT 132 (L) 06/11/2022 0417   MCV 84.8 06/11/2022 0417   NEUTROABS 6.8 06/11/2022 0417   LYMPHSABS 0.3 (L) 06/11/2022 0417   MONOABS 0.2 06/11/2022 0417   EOSABS 0.0 06/11/2022 0417   BASOSABS 0.1 06/11/2022 0417   Comprehensive Metabolic Panel:    Component Value Date/Time   NA 142 06/11/2022 0417   K 3.2 (L) 06/11/2022 0417   CL 105 06/11/2022 0417   CO2 24 06/11/2022 0417   BUN 34 (H) 06/11/2022 0417   CREATININE 1.23 (H) 06/11/2022 0417   CREATININE 0.78 07/06/2021 1007   GLUCOSE 65 (L) 06/11/2022 0417   CALCIUM 8.3 (L) 06/11/2022 0417   AST 50 (H)  06/10/2022 1139   ALT 30 06/10/2022 1139   ALKPHOS 252 (H) 06/10/2022 1139   BILITOT 1.5 (H) 06/10/2022 1139   PROT 6.8 06/10/2022 1139   ALBUMIN 2.3 (L) 06/10/2022 1139    RADIOGRAPHIC STUDIES: CT CERVICAL SPINE WO CONTRAST  Result Date: 06/11/2022 CLINICAL DATA:  Neck trauma (Age >= 65y) EXAM: CT CERVICAL SPINE WITHOUT CONTRAST TECHNIQUE: Multidetector CT imaging of the cervical spine was performed without intravenous contrast. Multiplanar CT image reconstructions were also generated. RADIATION DOSE  REDUCTION: This exam was performed according to the departmental dose-optimization program which includes automated exposure control, adjustment of the mA and/or kV according to patient size and/or use of iterative reconstruction technique. COMPARISON:  None Available. FINDINGS: Alignment: No substantial sagittal subluxation. Skull base and vertebrae: No evidence of acute fracture. Vertebral body heights are maintained. Soft tissues and spinal canal: No prevertebral fluid or swelling. No visible canal hematoma. Disc levels: Severe multilevel degenerative change. This includes severe multilevel degenerative disease as well as multilevel facet/uncovertebral hypertrophy with varying degrees of neural foraminal stenosis. Upper chest: Biapical pleuroparenchymal scarring. IMPRESSION: 1. No evidence of acute fracture or traumatic malalignment. 2. Severe multilevel degenerative change. Electronically Signed   By: Margaretha Sheffield M.D.   On: 06/11/2022 12:31   DG Chest Port 1 View  Result Date: 06/11/2022 CLINICAL DATA:  Cough EXAM: PORTABLE CHEST 1 VIEW COMPARISON:  CXR 06/10/22 FINDINGS: Right-sided chest port in place with tip at the cavoatrial junction. No pleural effusion. No pneumothorax. Unchanged cardiac and mediastinal contours. There are prominent bilateral interstitial opacities, which are nonspecific and may represent pulmonary venous congestion or atypical infection. There may also be a focal  retrocardiac opacity. No radiographically apparent displaced rib fractures. Visualized upper abdomen is unremarkable. IMPRESSION: Prominent bilateral interstitial opacities, which are nonspecific and may represent pulmonary venous congestion or atypical infection. There also may be a retrocardiac opacity which could represent a pneumonia. Recommend two-view chest radiograph for further evaluation. Electronically Signed   By: Marin Roberts M.D.   On: 06/11/2022 11:44   US RENAL  Result Date: 06/11/2022 CLINICAL DATA:  Acute knee injury, history of chronic kidney disease, GERD EXAM: RENAL / URINARY TRACT ULTRASOUND COMPLETE COMPARISON:  CT abdomen 01/18/2022 FINDINGS: Right Kidney: Renal measurements: 11.5 x 3.8 x 6.5 cm = volume: 149 mL. Normal cortical thickness. Increased cortical echogenicity. No mass, hydronephrosis, or shadowing calcification. Trace perinephric fluid RIGHT kidney. Left Kidney: Renal measurements: 10.3 x 4.3 x 5.3 cm = volume: 123 mL. Normal cortical thickness. Increased cortical echogenicity. No mass, hydronephrosis, or shadowing calcification. Bladder: Foley catheter within urinary bladder. Bladder contains minimal urine, poorly assessed. Other: Small BILATERAL pleural effusions.  Mild ascites. IMPRESSION: Medical renal disease changes of both kidneys. No evidence of renal mass or hydronephrosis. Trace perinephric fluid RIGHT kidney. Small BILATERAL pleural effusions and mild ascites. Electronically Signed   By: Lavonia Dana M.D.   On: 06/11/2022 09:25   MR BRAIN WO CONTRAST  Result Date: 06/10/2022 CLINICAL DATA:  Initial evaluation for mental status change, unknown cause. EXAM: MRI HEAD WITHOUT CONTRAST TECHNIQUE: Multiplanar, multiecho pulse sequences of the brain and surrounding structures were obtained without intravenous contrast. COMPARISON:  None available. FINDINGS: Brain: Cerebral volume within normal limits. No significant cerebral white matter disease for age. No evidence for  acute or subacute ischemia. Gray-white matter differentiation maintained. No areas of chronic cortical infarction. No acute or chronic intracranial blood products. No mass lesion, midline shift or mass effect no hydrocephalus or extra-axial fluid collection. Pituitary gland and suprasellar region within normal limits. Vascular: Major intracranial vascular flow voids are maintained. Skull and upper cervical spine: Question chronic fracture deformity versus congenital segmental defect at the dens (series 9, image 11). Diffuse loss of normal bone marrow signal, nonspecific but can be seen with anemia, smoking, obesity, and infiltrative/myelofibrotic marrow processes. No focal marrow replacing lesion. No scalp soft tissue abnormality. Sinuses/Orbits: Globes and orbital soft tissues demonstrate no acute finding. Paranasal sinuses are largely clear. Trace bilateral mastoid effusions, of  doubtful significance. Other: None. IMPRESSION: 1. Normal brain MRI for age. No acute intracranial abnormality identified. 2. Question chronic fracture deformity versus congenital segmental defect at the dens. Finding could be further assessed with dedicated CT of the cervical spine as warranted. Electronically Signed   By: Jeannine Boga M.D.   On: 06/10/2022 20:08   DG Chest Port 1 View  Result Date: 06/10/2022 CLINICAL DATA:  Cough EXAM: PORTABLE CHEST 1 VIEW COMPARISON:  CXR 05/18/21 FINDINGS: Right-sided central venous catheter with tip at the cavoatrial junction. No pleural effusion. No pneumothorax. Unchanged cardiac and mediastinal contours. No focal airspace opacity. No radiographically apparent displaced rib fractures. Visualized upper abdomen is unremarkable. Degenerative changes of the right AC joint. IMPRESSION: No focal airspace opacity. Electronically Signed   By: Marin Roberts M.D.   On: 06/10/2022 12:09    PERFORMANCE STATUS (ECOG) : 4 - Bedbound  Review of Systems Unable to complete  Physical Exam General:  Ill-appearing, frail, temporal wasting Pulmonary: Unlabored Extremities: no edema, no joint deformities Skin: no rashes Neurological: Weakness, wakes to stimulation  IMPRESSION: Patient somewhat lethargic.  She wakes to stimulation and knows she is in the hospital but was unable to engage over a long period of time to have a meaningful conversation regarding goals.  I called and spoke with her husband and then spoke to daughter and son-in-law by phone.  Family verbalized understanding that patient is quite frail at baseline and potentially could further decline until end-of-life.  They are in agreement with current scope of treatment and would like to give her every possible chance at recovery.  We discussed that future cancer treatments can greatly depend on overall performance status.  We discussed CODE STATUS and family agreed to further discuss end-of-life decision-making.  At this time, husband requested that patient remain a full code/full scope of treatment.  PLAN: -Continue current prescription of treatment -Family discussing CODE STATUS -Will follow  Case and plan discussed with Dr. Grayland Ormond   Time Total: 60 minutes  Visit consisted of counseling and education dealing with the complex and emotionally intense issues of symptom management and palliative care in the setting of serious and potentially life-threatening illness.Greater than 50%  of this time was spent counseling and coordinating care related to the above assessment and plan.  Signed by: Altha Harm, PhD, NP-C

## 2022-06-11 NOTE — Progress Notes (Signed)
Progress Note   Patient: Meredith Wilcox [id number] DOB: February 03, 1941 DOA: 06/10/2022     0 DOS: the patient was seen and examined on 06/11/2022   Brief hospital course: 82 y.o. 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   Meredith Wilcox states that 2-3 days ago, she was walking in her living room to plug in a radio. When she bent over to pick up the cable, she felt herself falling down onto the ground.  She does not believe that she hit her head or lost consciousness.  Prior to the fall, she denies any dizziness, chest pain, shortness of breath or palpitations.  After the fall, she notes that she was so weak she was unable to stand up on her own.  She states generalized weakness has been ongoing and is worse after chemotherapy, which her last infusion was on 06/03/2022. She laid on the ground for at least 2 hours.  She cannot recall how she was able to stand up or if someone helped her stand up.  Her husband at bedside states he was not the one to help her up and he does not know how she got up off the ground.  Since that time, she has been experiencing increasing generalized weakness and decreased appetite.  She has not eaten in at least 2 days and has had very little water.  Due to this, she decided to come to the ER today.  At this time, Meredith Wilcox denies any fever, chills, nausea, vomiting, diarrhea, abdominal pain, chest pain, shortness of breath or palpitations.  She notes that she has had a cough since starting chemotherapy but her sputum states seems thicker and more difficult to swallow in the last few days.   Per chart review, patient's daughter found her mother on the ground on the evening of 2/28.  3/1.  Patient not eating.  I will continue IV fluids.  Awaiting palliative care consultation.  Repeat chest x-ray to see if pneumonia.  Assessment and Plan: * AKI (acute kidney  injury) ([place]) Creatinine 1.61 on presentation and down to 1.23.  Continue IV fluids with the patient not eating.  Malignant tumor of head of pancreas [facility]) Patient has a history of stage IV adenocarcinoma of the pancreas currently undergoing chemotherapy with Gemcitabine/Abraxane.  Case discussed with oncology and palliative care.  Patient still listed as a full code.  Overall prognosis poor especially if she does not eat.  Ground-level fall MRI did not show any acute stroke.  Question of chronic fracture deformity versus congenital segmental defect at the dens.  CT cervical spine ordered to further clarify.  PT and OT consultations.  Generalized weakness Patient with profound weakness that is equal throughout, likely secondary to progressive stage IV cancer undergoing chemotherapy.  PT and OT consultation  Thrombocytopenia ([place]) Likely secondary to chemotherapy.  Platelet count up at 132.  Hypokalemia Replace potassium check magnesium and phosphorus tomorrow.  Anemia of chronic disease Hemoglobin stable but on the lower side at 7.6.  May end up needing a transfusion during the hospital course.  Likely secondary to chemotherapy  Protein-calorie malnutrition, severe With failure to thrive.  Will get palliative care consultation.        Subjective: Patient feels okay.  Answers yes or no questions.  Does not elaborate much.  Spoke with patient's daughter on the phone.  Physical Exam: Vitals:   06/10/22 2009 06/11/22 0602 06/11/22 [id number]  06/11/22 0726  BP: (!) 149/54 138/64  (!) 144/56  Pulse: 99 (!) 110  (!) 110  Resp: '20 20  18  '$ Temp: 98.1 F (36.7 C) 98.7 F (37.1 C)  98.8 F (37.1 C)  TempSrc: Oral Oral    SpO2: 97% 96%  100%  Weight:   48 kg   Height:       Physical Exam HENT:     Head: Normocephalic.     Mouth/Throat:     Pharynx: No oropharyngeal exudate.  Eyes:     General: Lids are normal.     Conjunctiva/sclera: Conjunctivae normal.  Cardiovascular:     Rate  and Rhythm: Normal rate and regular rhythm.     Heart sounds: Normal heart sounds, S1 normal and S2 normal.  Pulmonary:     Breath sounds: No decreased breath sounds, wheezing, rhonchi or rales.  Abdominal:     Palpations: Abdomen is soft.     Tenderness: There is generalized abdominal tenderness.  Musculoskeletal:     Right lower leg: No swelling.     Left lower leg: No swelling.  Skin:    General: Skin is warm.     Findings: No rash.  Neurological:     Mental Status: She is alert.     Comments: Unable to straight leg raise.  Able to move arms.     Data Reviewed: My blood cell count 7.4, hemoglobin 7.6, platelet count 132, potassium 3.2, creatinine 1.23  Family Communication: Updated patient's daughter on the phone.  She will speak with her father about CODE STATUS.  Disposition: Status is: Observation Patient not eating and having failure to thrive.  Palliative care consultation.  Will get a CT scan of the cervical spine with MRI findings.  Continue IV fluids with acute kidney injury.  Replace potassium.  Planned Discharge Destination: To be determined    Time spent: 28 minutes Spoke with oncology palliative care and dietitian.  Overall prognosis poor.  Author: Loletha Grayer, MD 06/11/2022 11:47 AM  For on call review [email or web address].

## 2022-06-11 NOTE — Progress Notes (Signed)
OT Cancellation Note  Patient Details Name: Chyrel Larocque MRN: [id number] DOB: 26-Dec-1940   Cancelled Treatment:    Reason Eval/Treat Not Completed: Patient at procedure or test/ unavailable. Pt off floor at present, in radiology, for kidney ultrasound. Will attempt OT evaluation at a later time/date, as pt is available and medically appropriate.  Josiah Lobo 06/11/2022, 10:39 AM

## 2022-06-11 NOTE — Progress Notes (Signed)
Pharmacy Antibiotic Note  Meredith Wilcox is a 82 y.o. female admitted on 06/10/2022 with aspiration pneumonia.  Pharmacy has been consulted for Unasyn dosing.  -also on azithromycin x3 doses  Plan: Unasyn 3 gm IV q12h  for Crcl 27.6 ml/min   Height: '5\' 5"'$  (165.1 cm) Weight: 48 kg (105 lb 13.1 oz) IBW/kg (Calculated) : 57  Temp (24hrs), Avg:98.4 F (36.9 C), Min:98 F (36.7 C), Max:98.8 F (37.1 C)  Recent Labs  Lab 06/10/22 1139 06/11/22 0417  WBC 7.7 7.4  CREATININE 1.61* 1.23*  LATICACIDVEN 1.4 1.3    Estimated Creatinine Clearance: 27.6 mL/min (A) (by C-G formula based on SCr of 1.23 mg/dL (H)).    Allergies  Allergen Reactions   Tussionex Pennkinetic Er [Hydrocod Poli-Chlorphe Poli Er] Other (See Comments)    Real dizzy headed even after a while still walking every direction but straight, patient stated she stopped taking it because of that.   Hydrocodone     Light headed and dizziness    Antimicrobials this admission: Azithromycin  3/1 >> x3doses unasyn 3/1 >>    Dose adjustments this admission:    Microbiology results:   BCx:     UCx:      Sputum:      MRSA PCR:    Thank you for allowing pharmacy to be a part of this patient's care.  Stefani Baik A 06/11/2022 12:19 PM

## 2022-06-12 DIAGNOSIS — E876 Hypokalemia: Secondary | ICD-10-CM

## 2022-06-12 DIAGNOSIS — N179 Acute kidney failure, unspecified: Secondary | ICD-10-CM | POA: Diagnosis not present

## 2022-06-12 DIAGNOSIS — C250 Malignant neoplasm of head of pancreas: Secondary | ICD-10-CM | POA: Diagnosis not present

## 2022-06-12 DIAGNOSIS — D638 Anemia in other chronic diseases classified elsewhere: Secondary | ICD-10-CM

## 2022-06-12 DIAGNOSIS — R531 Weakness: Secondary | ICD-10-CM | POA: Diagnosis not present

## 2022-06-12 DIAGNOSIS — E43 Unspecified severe protein-calorie malnutrition: Secondary | ICD-10-CM

## 2022-06-12 DIAGNOSIS — W1830XA Fall on same level, unspecified, initial encounter: Secondary | ICD-10-CM | POA: Diagnosis not present

## 2022-06-12 LAB — COMPREHENSIVE METABOLIC PANEL
ALT: 26 U/L (ref 0–44)
AST: 53 U/L — ABNORMAL HIGH (ref 15–41)
Albumin: 1.8 g/dL — ABNORMAL LOW (ref 3.5–5.0)
Alkaline Phosphatase: 283 U/L — ABNORMAL HIGH (ref 38–126)
Anion gap: 8 (ref 5–15)
BUN: 26 mg/dL — ABNORMAL HIGH (ref 8–23)
CO2: 28 mmol/L (ref 22–32)
Calcium: 8.4 mg/dL — ABNORMAL LOW (ref 8.9–10.3)
Chloride: 110 mmol/L (ref 98–111)
Creatinine, Ser: 0.97 mg/dL (ref 0.44–1.00)
GFR, Estimated: 59 mL/min — ABNORMAL LOW (ref >60)
Glucose, Bld: 121 mg/dL — ABNORMAL HIGH (ref 70–99)
Potassium: 3.2 mmol/L — ABNORMAL LOW (ref 3.5–5.1)
Sodium: 146 mmol/L — ABNORMAL HIGH (ref 135–145)
Total Bilirubin: 1.2 mg/dL (ref 0.3–1.2)
Total Protein: 6.3 g/dL — ABNORMAL LOW (ref 6.5–8.1)

## 2022-06-12 LAB — AMMONIA: Ammonia: 20 umol/L (ref 9–35)

## 2022-06-12 LAB — CBC
HCT: 20.5 % — ABNORMAL LOW (ref 36.0–46.0)
Hemoglobin: 6.8 g/dL — ABNORMAL LOW (ref 12.0–15.0)
MCH: 28.2 pg (ref 26.0–34.0)
MCHC: 33.2 g/dL (ref 30.0–36.0)
MCV: 85.1 fL (ref 80.0–100.0)
Platelets: 112 10*3/uL — ABNORMAL LOW (ref 150–400)
RBC: 2.41 MIL/uL — ABNORMAL LOW (ref 3.87–5.11)
RDW: 16.6 % — ABNORMAL HIGH (ref 11.5–15.5)
WBC: 5.6 10*3/uL (ref 4.0–10.5)
nRBC: 0 % (ref 0.0–0.2)

## 2022-06-12 LAB — PHOSPHORUS: Phosphorus: 2.5 mg/dL (ref 2.5–4.6)

## 2022-06-12 LAB — MAGNESIUM: Magnesium: 2.1 mg/dL (ref 1.7–2.4)

## 2022-06-12 LAB — HEMOGLOBIN AND HEMATOCRIT, BLOOD
HCT: 26 % — ABNORMAL LOW (ref 36.0–46.0)
Hemoglobin: 8.7 g/dL — ABNORMAL LOW (ref 12.0–15.0)

## 2022-06-12 LAB — PREPARE RBC (CROSSMATCH)

## 2022-06-12 MED ORDER — SODIUM CHLORIDE 0.9 % IV SOLN
3.0000 g | Freq: Four times a day (QID) | INTRAVENOUS | Status: AC
  Administered 2022-06-12 – 2022-06-15 (×15): 3 g via INTRAVENOUS
  Filled 2022-06-12 (×4): qty 3
  Filled 2022-06-12: qty 8
  Filled 2022-06-12 (×4): qty 3
  Filled 2022-06-12 (×3): qty 8
  Filled 2022-06-12 (×3): qty 3

## 2022-06-12 MED ORDER — SCOPOLAMINE 1 MG/3DAYS TD PT72
1.0000 | MEDICATED_PATCH | TRANSDERMAL | Status: DC
  Administered 2022-06-12 – 2022-06-15 (×2): 1.5 mg via TRANSDERMAL
  Filled 2022-06-12 (×2): qty 1

## 2022-06-12 MED ORDER — POTASSIUM CHLORIDE 10 MEQ/100ML IV SOLN
10.0000 meq | INTRAVENOUS | Status: AC
  Administered 2022-06-12 (×2): 10 meq via INTRAVENOUS
  Filled 2022-06-12: qty 100

## 2022-06-12 MED ORDER — POTASSIUM CHLORIDE 20 MEQ PO PACK: 40.0000 meq | PACK | Freq: Once | ORAL | Status: DC

## 2022-06-12 MED ORDER — SODIUM CHLORIDE 0.9% IV SOLUTION: Freq: Once | INTRAVENOUS | Status: AC

## 2022-06-12 MED ORDER — POTASSIUM CHLORIDE 10 MEQ/100ML IV SOLN
10.0000 meq | INTRAVENOUS | Status: DC
  Administered 2022-06-12 (×2): 10 meq via INTRAVENOUS
  Filled 2022-06-12 (×3): qty 100

## 2022-06-12 NOTE — Progress Notes (Signed)
PHARMACY NOTE:  ANTIMICROBIAL RENAL DOSAGE ADJUSTMENT  Current antimicrobial regimen includes a mismatch between antimicrobial dosage and estimated renal function.  As per policy approved by the Pharmacy & Therapeutics and Medical Executive Committees, the antimicrobial dosage will be adjusted accordingly.  Current antimicrobial dosage: Unasyn 3 g IV q12h  Indication: Aspiration pneumonia  Renal Function:  Estimated Creatinine Clearance: 34.3 mL/min (by C-G formula based on SCr of 0.97 mg/dL).    Antimicrobial dosage has been changed to:  Unasyn 3 g IV q6h   Thank you for allowing pharmacy to be a part of this patient's care.  Benita Gutter, [facility] 06/12/2022 7:52 AM

## 2022-06-12 NOTE — Progress Notes (Signed)
       CROSS COVER NOTE  NAME: Meredith Wilcox MRN: [id number] DOB : [birth year]    HPI/Events of Note   Critical hgb reported 6.8 down from 7.6 yesterday  Assessment and  Interventions   Assessment: No changes in heart rate or BP Patient unable to consent for transfusion Plan: Type and screen Defer to day team for blood transfusion decision and consent if needed       Kathlene Cote NP [facility]

## 2022-06-12 NOTE — Evaluation (Addendum)
Physical Therapy Evaluation Patient Details Name: Meredith Wilcox MRN: [id number] DOB: Jul 02, 1940 Today's Date: 06/12/2022  History of Present Illness  Pt admitted for AKI with complaints of fall at home. History includes stage IV adenocarcinoma of pancreas, GERD and HLD.  Clinical Impression  Pt is a pleasant 82 year old female who was admitted for AKI and fall at home. Of note, low Hgb, however no orders for transfusion and pt asymptomatic at this time. Pt performs bed mobility/transfers with min assist and RW. Very lethargic and not safe to progress to ambulation away from bed. Pt demonstrates deficits with cognition/mobility/strength. Appears very close to baseline level and anticipate would improve quicker in home environment. She reports good family assistance at home. Would benefit from skilled PT to address above deficits and promote optimal return to PLOF. Recommend transition to [place] upon discharge from acute hospitalization.      Recommendations for follow up therapy are one component of a multi-disciplinary discharge planning process, led by the attending physician.  Recommendations may be updated based on patient status, additional functional criteria and insurance authorization.  Follow Up Recommendations Home health PT      Assistance Recommended at Discharge Frequent or constant Supervision/Assistance  Patient can return home with the following  A lot of help with walking and/or transfers;A lot of help with bathing/dressing/bathroom;Help with stairs or ramp for entrance    Equipment Recommendations Rolling walker (2 wheels)  Recommendations for Other Services       Functional Status Assessment Patient has had a recent decline in their functional status and demonstrates the ability to make significant improvements in function in a reasonable and predictable amount of time.     Precautions / Restrictions Precautions Precautions: Fall Restrictions Weight Bearing Restrictions:  No      Mobility  Bed Mobility Overal bed mobility: Needs Assistance Bed Mobility: Supine to Sit     Supine to sit: Min assist     General bed mobility comments: needs assist for initiation of mobility. Reports "wait a minute, wait a minute". Once seated at EOB, decreased static balance noted with min assist for upright posture. Improved with time to supervision    Transfers Overall transfer level: Needs assistance Equipment used: Rolling walker (2 wheels) Transfers: Bed to chair/wheelchair/BSC     Step pivot transfers: Min assist       General transfer comment: sit<>Stand performed with RW and min assist. Needs hand over hand placement and cues for sequencing. Once standing, appears steady on feet. Assisted in stepping at EOB and then progressed to stepping over to recliner. Needs heavy cues for sequencing    Ambulation/Gait               General Gait Details: not safe due to lethargy  Stairs            Wheelchair Mobility    Modified Rankin (Stroke Patients Only)       Balance Overall balance assessment: Needs assistance Sitting-balance support: Feet supported Sitting balance-Leahy Scale: Poor     Standing balance support: Bilateral upper extremity supported Standing balance-Leahy Scale: Poor                               Pertinent Vitals/Pain Pain Assessment Pain Assessment: Faces Faces Pain Scale: Hurts a little bit Pain Location: "all over" Pain Descriptors / Indicators: Discomfort Pain Intervention(s): Limited activity within patient's tolerance, Repositioned    Home Living Family/patient expects to  be discharged to:: Private residence Living Arrangements: Spouse/significant other Available Help at Discharge: Family Type of Home: House       Alternate Level Stairs-Number of Steps: [place]: Two level;Bed/bath upstairs Home Equipment: None Additional Comments: pt is very lethargic and questionable historian.  Unsure of accuracy    Prior Function Prior Level of Function : Needs assist             Mobility Comments: pt is poor historian. Reports she had only had 1 fall that resulted in admission ADLs Comments: family assist with feeding     Hand Dominance        Extremity/Trunk Assessment   Upper Extremity Assessment Upper Extremity Assessment: Generalized weakness (B UE grossly 3/5)    Lower Extremity Assessment Lower Extremity Assessment: Generalized weakness (B LE grossly 3/5)       Communication   Communication: No difficulties  Cognition Arousal/Alertness: Lethargic Behavior During Therapy: Flat affect Overall Cognitive Status: No family/caregiver present to determine baseline cognitive functioning                                 General Comments: doesn't open eyes except when reminded. Pt very sleepy with limited response to history questions        General Comments      Exercises     Assessment/Plan    PT Assessment Patient needs continued PT services  PT Problem List Decreased strength;Decreased activity tolerance;Decreased balance;Decreased mobility;Decreased cognition       PT Treatment Interventions Gait training;DME instruction;Therapeutic exercise;Balance training    PT Goals (Current goals can be found in the Care Plan section)  Acute Rehab PT Goals Patient Stated Goal: unable to state PT Goal Formulation: Patient unable to participate in goal setting Time For Goal Achievement: 06/26/22 Potential to Achieve Goals: Fair    Frequency Min 2X/week     Co-evaluation               AM-PAC PT "6 Clicks" Mobility  Outcome Measure Help needed turning from your back to your side while in a flat bed without using bedrails?: A Little Help needed moving from lying on your back to sitting on the side of a flat bed without using bedrails?: A Little Help needed moving to and from a bed to a chair (including a wheelchair)?: A  Little Help needed standing up from a chair using your arms (e.g., wheelchair or bedside chair)?: A Little Help needed to walk in hospital room?: A Little Help needed climbing 3-5 steps with a railing? : A Lot 6 Click Score: 17    End of Session Equipment Utilized During Treatment: Gait belt Activity Tolerance: Patient limited by lethargy Patient left: in chair;with chair alarm set Nurse Communication: Mobility status PT Visit Diagnosis: Unsteadiness on feet (R26.81);Muscle weakness (generalized) (M62.81);History of falling (Z91.81);Difficulty in walking, not elsewhere classified (R26.2)    Time: [id number] PT Time Calculation (min) (ACUTE ONLY): 22 min   Charges:   PT Evaluation $PT Eval Low Complexity: 1 Low PT Treatments $Therapeutic Activity: 8-22 mins        Greggory Stallion, PT, DPT, GCS [phone]   Irini Leet 06/12/2022, 11:42 AM

## 2022-06-12 NOTE — Progress Notes (Signed)
Blood transfusion done. VSS. Patient denies any pain at this time

## 2022-06-12 NOTE — Progress Notes (Signed)
OT Cancellation Note  Patient Details Name: Meredith Wilcox MRN: [id number] DOB: 1940/07/20   Cancelled Treatment:    Reason Eval/Treat Not Completed: Medical issues which prohibited therapy (Pt. presents with low Hgb 6.8. OT intervention is contraindicated at this time. Will continue to monitor, and intervene when appropriate.)  Harrel Carina, MS, OTR/L 06/12/2022, 9:17 AM

## 2022-06-12 NOTE — Evaluation (Signed)
Clinical/Bedside Swallow Evaluation Patient Details  Name: Meredith Wilcox MRN: [id number] Date of Birth: 08-Mar-1941  Today's Date: 06/12/2022 Time: SLP Start Time (ACUTE ONLY): 0840 SLP Stop Time (ACUTE ONLY): 0930 SLP Time Calculation (min) (ACUTE ONLY): 50 min  Past Medical History:  Past Medical History:  Diagnosis Date   Anemia    H/O DURING PREGNANCY   Arthritis    Chronic kidney disease    KIDNEY PROBLEMS AROUND AGE 82   Geographic tongue    GERD (gastroesophageal reflux disease)    H/O NO MEDS   Helicobacter pylori gastrointestinal tract infection    Hyperlipidemia    Incontinence    Indigestion    Prolapse of vaginal walls    Right sided sciatica    Tachycardia    Urethral prolapse    Past Surgical History:  Past Surgical History:  Procedure Laterality Date   BREAST BIOPSY Left 10/11/2016   left breast stereo/ VASCULAR LESION   BREAST BIOPSY Left 12/02/2016   Procedure: NEEDLE LOCALIZATION;  Surgeon: [name], MD;  Location: [facility] ORS;  Service: General;  Laterality: Left;   BREAST EXCISIONAL BIOPSY Left 12/02/2016   left lumpectomy   BREAST LUMPECTOMY Left 12/02/2016   Procedure: EXCISION BREAST MASS;  Surgeon: [name], MD;  Location: [facility] ORS;  Service: General;  Laterality: Left;   DILATION AND CURETTAGE OF UTERUS     dnc     1983   ERCP N/A 07/07/2021   Procedure: ENDOSCOPIC RETROGRADE CHOLANGIOPANCREATOGRAPHY (ERCP);  Surgeon: [name], MD;  Location: [facility] ENDOSCOPY;  Service: Endoscopy;  Laterality: N/A;   ERCP N/A 10/01/2021   Procedure: ENDOSCOPIC RETROGRADE CHOLANGIOPANCREATOGRAPHY (ERCP);  Surgeon: [name], MD;  Location: [facility] ENDOSCOPY;  Service: Endoscopy;  Laterality: N/A;   fatty tumor removal Right    hand   IR IMAGING GUIDED PORT INSERTION  04/06/2022   HPI:  Pt is a 82 y.o. 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     Meredith Wilcox states that 2-3 days ago, she was walking in her living room to plug in a radio. When she bent over to pick up the cable, she felt herself falling down onto the ground.  She does not believe that she hit her head or lost consciousness.  Prior to the fall, she denies any dizziness, chest pain, shortness of breath or palpitations.  After the fall, she notes that she was so weak she was unable to stand up on her own.  She states generalized weakness has been ongoing and is worse after chemotherapy, which her last infusion was on 06/03/2022. She laid on the ground for at least 2 hours.  She cannot recall how she was able to stand up or if someone helped her stand up.  Her husband at bedside states he was not the one to help her up and he does not know how she got up off the ground.  Since that time, she has been experiencing increasing generalized weakness and decreased appetite.  She has not eaten in at least 2 days and has had very little water.  Due to this, she decided to come to the ER today.   CXR: Prominent bilateral interstitial opacities, which are nonspecific  and may represent pulmonary venous congestion or atypical infection. Last CXR ~1 year ago was Negative.     Assessment / Plan / Recommendation  Clinical Impression   Pt seen  for BSE today. pt awake, verbal to answer basic questions re: self. She required min cues for follow through w/ tasks.  On RA; afebrile. WBC wnl. Hemoglobin 6.8; noted labs.  Pt appears to present w/ grossly functional oropharyngeal phase swallowing w/ No oropharyngeal phase dysphagia noted during trials given, No neuromuscular deficits noted. Pt consumed po trials w/ No overt, clinical s/s of aspiration during po trials. Pt appears at reduced risk for aspiration when following general aspiration precautions and using a slightly modified food consistency diet for ease of oral intake overall in setting of weakness/deconditioned  status.  However, pt does have challenging factors that could impact her oropharyngeal swallowing to include fatigue/weakness, ongoing Ca dx/txs, lack of desire for oral intake, and acute hospitalization. These factors can increase risk for aspiration, dysphagia as well as decreased oral intake overall.   During po trials, pt consumed all consistencies w/ no overt coughing, decline in vocal quality, or change in respiratory presentation during/post trials. O2 sats remained 98%. Oral phase appeared grossly [facility] w/ timely bolus management, mastication, and control of bolus propulsion for A-P transfer for swallowing. Oral clearing achieved w/ all trial consistencies -- moistened, soft foods given. Pt required verbal cues and encouragement during oral intake -- lack of interest for the po's/foods especially.  OM Exam appeared [facility] w/ no unilateral weakness noted. Speech Clear. Pt fed self holding cup w/ setup support.   Recommend a more Mech Soft consistency diet w/ well-Cut meats, moistened foods; Thin liquids -- monitor straw use, and pt should help to Hold Cup when drinking. Find foods of interest to pt to support meals/oral intake. Recommend general aspiration precautions, tray setup and feeding support at meals as needed. Positioning upright for oral intake. Pills WHOLE vs Crushed in Puree for safer, easier swallowing.   Education given on Pills in Puree; food consistencies and easy to eat options; general aspiration precautions to pt and Dtr. NSG to reconsult if any new needs arise. NSG updated, agreed. MD updated. Recommend Dietician f/u for support. No further skilled ST services indicated currently. MD to reconsult if new needs arise during admit. Precautions posted in chart, room. NSG updated, agreed. SLP Visit Diagnosis: Dysphagia, unspecified (R13.10)    Aspiration Risk  Mild aspiration risk;Risk for inadequate nutrition/hydration d/t deconditioned status- (reduced when following general  aspiration precautions)    Diet Recommendation   a more Mech Soft consistency diet w/ well-Cut meats, moistened foods; Thin liquids -- monitor straw use, and pt should help to Hold Cup when drinking. Find foods of interest to pt to support meals/oral intake. Recommend general aspiration precautions, tray setup and feeding support at meals as needed. Positioning upright for oral intake.  Medication Administration: Whole meds with puree (vs need to Crush in puree)    Other  Recommendations Recommended Consults:  (Dietician f/u; Palliative Care following) Oral Care Recommendations: Oral care BID;Oral care before and after PO;Staff/trained caregiver to provide oral care    Recommendations for follow up therapy are one component of a multi-disciplinary discharge planning process, led by the attending physician.  Recommendations may be updated based on patient status, additional functional criteria and insurance authorization.  Follow up Recommendations No SLP follow up      Assistance Recommended at Discharge  full  Functional Status Assessment Patient has had a recent decline in their functional status and demonstrates the ability to make significant improvements in function in a reasonable and predictable amount of time.  Frequency and Duration  (n/a)   (  n/a)       Prognosis Prognosis for improved oropharyngeal function: Fair (-good) Barriers to Reach Goals: Time post onset;Severity of deficits;Motivation Barriers/Prognosis Comment: ongoing Ca/txs; decreased desire for oral intake      Swallow Study   General Date of Onset: 06/10/22 HPI: Pt is a 82 y.o. 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     Mrs. Honegger states that 2-3 days ago, she was walking in her living room to plug in a radio. When she bent over to pick up the cable, she felt herself falling down  onto the ground.  She does not believe that she hit her head or lost consciousness.  Prior to the fall, she denies any dizziness, chest pain, shortness of breath or palpitations.  After the fall, she notes that she was so weak she was unable to stand up on her own.  She states generalized weakness has been ongoing and is worse after chemotherapy, which her last infusion was on 06/03/2022. She laid on the ground for at least 2 hours.  She cannot recall how she was able to stand up or if someone helped her stand up.  Her husband at bedside states he was not the one to help her up and he does not know how she got up off the ground.  Since that time, she has been experiencing increasing generalized weakness and decreased appetite.  She has not eaten in at least 2 days and has had very little water.  Due to this, she decided to come to the ER today.  CXR: Prominent bilateral interstitial opacities, which are nonspecific  and may represent pulmonary venous congestion or atypical infection. Last CXR ~1 year ago was Negative. Type of Study: Bedside Swallow Evaluation Previous Swallow Assessment: none Diet Prior to this Study: Dysphagia 3 (mechanical soft);Thin liquids (Level 0) Temperature Spikes Noted: No (wbc 5.6) Respiratory Status: Room air History of Recent Intubation: No Behavior/Cognition: Alert;Cooperative;Pleasant mood;Requires cueing Oral Cavity Assessment: Within Functional Limits Oral Care Completed by SLP: Yes Oral Cavity - Dentition: Adequate natural dentition Vision: Functional for self-feeding Self-Feeding Abilities: Able to feed self;Needs assist;Needs set up Patient Positioning: Upright in bed (needed support positioning) Baseline Vocal Quality: Normal;Low vocal intensity Volitional Cough: Strong Volitional Swallow: Able to elicit    Oral/Motor/Sensory Function Overall Oral Motor/Sensory Function: Within functional limits   Ice Chips Ice chips: Within functional limits Presentation:  Spoon (fed; 2 trials)   Thin Liquid Thin Liquid: Within functional limits Presentation: Cup;Self Fed;Straw (~4 ozs total) Other Comments: water, juice    Nectar Thick Nectar Thick Liquid: Not tested   Honey Thick Honey Thick Liquid: Not tested   Puree Puree: Within functional limits Presentation: Spoon (fed; 6 trials)   Solid     Solid: Within functional limits (softened solids) Presentation: Spoon (fed; 5 trials)         Orinda Kenner, MS, CCC-SLP Speech Language Pathologist Rehab Services; [place] [phone] (ascom) Dearion Huot 06/12/2022,12:25 PM

## 2022-06-12 NOTE — Progress Notes (Signed)
Progress Note   Patient: Meredith Wilcox [id number] DOB: 04/01/1941 DOA: 06/10/2022     1 DOS: the patient was seen and examined on 06/12/2022   Brief hospital course: 82 y.o. 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   Meredith Wilcox states that 2-3 days ago, she was walking in her living room to plug in a radio. When she bent over to pick up the cable, she felt herself falling down onto the ground.  She does not believe that she hit her head or lost consciousness.  Prior to the fall, she denies any dizziness, chest pain, shortness of breath or palpitations.  After the fall, she notes that she was so weak she was unable to stand up on her own.  She states generalized weakness has been ongoing and is worse after chemotherapy, which her last infusion was on 06/03/2022. She laid on the ground for at least 2 hours.  She cannot recall how she was able to stand up or if someone helped her stand up.  Her husband at bedside states he was not the one to help her up and he does not know how she got up off the ground.  Since that time, she has been experiencing increasing generalized weakness and decreased appetite.  She has not eaten in at least 2 days and has had very little water.  Due to this, she decided to come to the ER today.  At this time, Meredith Wilcox denies any fever, chills, nausea, vomiting, diarrhea, abdominal pain, chest pain, shortness of breath or palpitations.  She notes that she has had a cough since starting chemotherapy but her sputum states seems thicker and more difficult to swallow in the last few days.   Per chart review, patient's daughter found her mother on the ground on the evening of 2/28.  3/1.  Patient not eating.  I will continue IV fluids.  Awaiting palliative care consultation.  Repeat chest x-ray to see if pneumonia.  3/2: Mild hypothermia this morning,  maximum temperature recorded was 101 over the past 24 hours.  Repeat chest x-ray with bilateral interstitial opacities, which may represent pulmonary venous congestion or atypical infection.  There was also noted to have a retrocardiac opacity which can be pneumonia.  CT cervical spine was negative for any acute abnormality.  Hemoglobin decreased to 6.8, BMP with mild hyponatremia at 146, potassium low at 3.2, AKI resolved.  Continuing IV fluid. Palliative care with current scope of care, family to decide about CODE STATUS. Had another discussion and they will continue to keep current level of care, 1 unit of PRBC ordered.  Family was also inquiring about tube feeding.  Patient is very high risk for deterioration and mortality.  Assessment and Plan: * AKI (acute kidney injury) ([place]) Resolved with IV fluid. Creatinine 1.61 on presentation  - Continue IV fluids with the patient not eating.  Ground-level fall MRI did not show any acute stroke.  Question of chronic fracture deformity versus congenital segmental defect at the dens.  CT cervical spine ordered to further clarify.  PT and OT recommending home health  Malignant tumor of head of pancreas [facility]) Patient has a history of stage IV adenocarcinoma of the pancreas currently undergoing chemotherapy with Gemcitabine/Abraxane.  Case discussed with oncology and palliative care.  Patient still listed as a full code.  Overall prognosis poor especially if she does not eat.  Generalized weakness  Patient with profound weakness that is equal throughout, likely secondary to progressive stage IV cancer undergoing chemotherapy.  PT and OT consultation  Thrombocytopenia ([place]) Likely secondary to chemotherapy.  Platelet count up at 132.  Hypokalemia Stable and persistent hypokalemia with potassium at 3.2.  Magnesium within normal limit. -Replace potassium and monitor   Anemia of chronic disease Hemoglobin decreased to 6.8 this morning.  Chemotherapy  can be contributory. Patient has anemia of chronic disease due to advanced malignancy. -Ordered 1 unit of PRBC -Monitor hemoglobin  Protein-calorie malnutrition, severe With failure to thrive.  Palliative care was consulted at patient's family would like to continue current level of care.  They were also inquiring about feeding tube.       Subjective: Patient with no appetite and very poor p.o. intake.  Continued to feel weak.  Physical Exam: Vitals:   06/12/22 0949 06/12/22 1310 06/12/22 1345 06/12/22 1414  BP: (!) 140/54 (!) 142/63 (!) 145/59 (!) 147/63  Pulse: (!) 101 (!) 102 (!) 102 100  Resp: '20 16 18 16  '$ Temp: 98.4 F (36.9 C) 100 F (37.8 C) 99 F (37.2 C) 99 F (37.2 C)  TempSrc:      SpO2: 95% 100% 93% 94%  Weight:      Height:       General.  Frail and severely malnourished lady, in no acute distress. Pulmonary.  Lungs clear bilaterally, normal respiratory effort. CV.  Regular rate and rhythm, no JVD, rub or murmur. Abdomen.  Soft, nontender, nondistended, BS positive. CNS.  Alert and oriented .  No focal neurologic deficit. Extremities.  No edema, no cyanosis, pulses intact and symmetrical.    Data Reviewed: Prior data reviewed  Family Communication: Discussed with daughter and husband at bedside  Disposition: Status is: Inpatient  Planned Discharge Destination: Home with home health  Time spent: 40 minutes Spoke with oncology palliative care and dietitian.  Overall prognosis poor.  This record has been created using Systems analyst. Errors have been sought and corrected,but may not always be located. Such creation errors do not reflect on the standard of care.   Author: Lorella Nimrod, MD 06/12/2022 2:28 PM  For on call review [email or web address].

## 2022-06-13 DIAGNOSIS — C250 Malignant neoplasm of head of pancreas: Secondary | ICD-10-CM | POA: Diagnosis not present

## 2022-06-13 DIAGNOSIS — R531 Weakness: Secondary | ICD-10-CM | POA: Diagnosis not present

## 2022-06-13 DIAGNOSIS — N179 Acute kidney failure, unspecified: Secondary | ICD-10-CM | POA: Diagnosis not present

## 2022-06-13 DIAGNOSIS — W1830XA Fall on same level, unspecified, initial encounter: Secondary | ICD-10-CM | POA: Diagnosis not present

## 2022-06-13 LAB — TYPE AND SCREEN
ABO/RH(D): A POS
Antibody Screen: NEGATIVE
Unit division: 0

## 2022-06-13 LAB — CBC
HCT: 27.1 % — ABNORMAL LOW (ref 36.0–46.0)
Hemoglobin: 9.1 g/dL — ABNORMAL LOW (ref 12.0–15.0)
MCH: 28.3 pg (ref 26.0–34.0)
MCHC: 33.6 g/dL (ref 30.0–36.0)
MCV: 84.2 fL (ref 80.0–100.0)
Platelets: 131 10*3/uL — ABNORMAL LOW (ref 150–400)
RBC: 3.22 MIL/uL — ABNORMAL LOW (ref 3.87–5.11)
RDW: 16.9 % — ABNORMAL HIGH (ref 11.5–15.5)
WBC: 7.7 10*3/uL (ref 4.0–10.5)
nRBC: 0 % (ref 0.0–0.2)

## 2022-06-13 LAB — BASIC METABOLIC PANEL
Anion gap: 6 (ref 5–15)
BUN: 18 mg/dL (ref 8–23)
CO2: 28 mmol/L (ref 22–32)
Calcium: 8.5 mg/dL — ABNORMAL LOW (ref 8.9–10.3)
Chloride: 113 mmol/L — ABNORMAL HIGH (ref 98–111)
Creatinine, Ser: 0.85 mg/dL (ref 0.44–1.00)
GFR, Estimated: >60 mL/min (ref >60)
Glucose, Bld: 117 mg/dL — ABNORMAL HIGH (ref 70–99)
Potassium: 3.6 mmol/L (ref 3.5–5.1)
Sodium: 147 mmol/L — ABNORMAL HIGH (ref 135–145)

## 2022-06-13 LAB — BPAM RBC
Blood Product Expiration Date: 202403282359
ISSUE DATE / TIME: 202403021335
Unit Type and Rh: 6200

## 2022-06-13 MED ORDER — DEXTROSE 5 % IV SOLN: INTRAVENOUS | Status: AC

## 2022-06-13 NOTE — Progress Notes (Signed)
IV removed successfully, no complications. Pericare performed before and after catheter removal

## 2022-06-13 NOTE — Plan of Care (Signed)

## 2022-06-13 NOTE — TOC Progression Note (Signed)
Transition of Care [facility]) - Progression Note    Patient Details  Name: Meredith Wilcox MRN: [id number] Date of Birth: December 10, 1940  Transition of Care [facility]) CM/SW Contact  Izola Price, RN Phone Number: 06/13/2022, 1:45 PM  Clinical Narrative: 3/3: Patient noted to be very lethargic and non verbal today. Checked with RN as TOC for [name] needs. RN stated patient is different today, but very tired. Will check back later. TOC to follow progress. Simmie Davies RN CM            Expected Discharge Plan and Services                                               Social Determinants of Health (SDOH) Interventions SDOH Screenings   Food Insecurity: No Food Insecurity (06/10/2022)  Housing: Low Risk  (06/10/2022)  Transportation Needs: No Transportation Needs (06/10/2022)  Utilities: Not At Risk (06/10/2022)  Alcohol Screen: Low Risk  (11/08/2019)  Depression (PHQ2-9): Low Risk  (03/15/2022)  Financial Resource Strain: Low Risk  (12/31/2021)  Physical Activity: Sufficiently Active (12/31/2021)  Social Connections: Socially Integrated (12/31/2021)  Stress: No Stress Concern Present (12/31/2021)  Tobacco Use: Low Risk  (06/10/2022)    Readmission Risk Interventions     No data to display

## 2022-06-13 NOTE — Progress Notes (Signed)
Progress Note   Patient: Meredith Wilcox [id number] DOB: 02-18-1941 DOA: 06/10/2022     2 DOS: the patient was seen and examined on 06/13/2022   Brief hospital course: 82 y.o. 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   Meredith Wilcox states that 2-3 days ago, she was walking in her living room to plug in a radio. When she bent over to pick up the cable, she felt herself falling down onto the ground.  She does not believe that she hit her head or lost consciousness.  Prior to the fall, she denies any dizziness, chest pain, shortness of breath or palpitations.  After the fall, she notes that she was so weak she was unable to stand up on her own.  She states generalized weakness has been ongoing and is worse after chemotherapy, which her last infusion was on 06/03/2022. She laid on the ground for at least 2 hours.  She cannot recall how she was able to stand up or if someone helped her stand up.  Her husband at bedside states he was not the one to help her up and he does not know how she got up off the ground.  Since that time, she has been experiencing increasing generalized weakness and decreased appetite.  She has not eaten in at least 2 days and has had very little water.  Due to this, she decided to come to the ER today.  At this time, Meredith Wilcox denies any fever, chills, nausea, vomiting, diarrhea, abdominal pain, chest pain, shortness of breath or palpitations.  She notes that she has had a cough since starting chemotherapy but her sputum states seems thicker and more difficult to swallow in the last few days.   Per chart review, patient's daughter found her mother on the ground on the evening of 2/28.  3/1.  Patient not eating.  I will continue IV fluids.  Awaiting palliative care consultation.  Repeat chest x-ray to see if pneumonia.  3/2: Mild hypothermia this morning,  maximum temperature recorded was 101 over the past 24 hours.  Repeat chest x-ray with bilateral interstitial opacities, which may represent pulmonary venous congestion or atypical infection.  There was also noted to have a retrocardiac opacity which can be pneumonia.  CT cervical spine was negative for any acute abnormality.  Hemoglobin decreased to 6.8, BMP with mild hyponatremia at 146, potassium low at 3.2, AKI resolved.  Continuing IV fluid. Palliative care with current scope of care, family to decide about CODE STATUS. Had another discussion and they will continue to keep current level of care, 1 unit of PRBC ordered.  Family was also inquiring about tube feeding.  3/3: Vital stable with hemoglobin improvement to 9.1, slight worsening of hypernatremia at 147 with water deficit of 1.1 L.  Giving some D5.  Potassium normalized at 3.6. P.o. intake remained poor.  Patient does not want feeding tube.  We will remove Foley catheter today to give her a voiding trial. Family would like to hear from Dr. Grayland Ormond regarding further management and prognosis.  Patient is very high risk for deterioration and mortality.  Assessment and Plan: * AKI (acute kidney injury) ([place]) Resolved with IV fluid. Creatinine 1.61 on presentation  - Continue IV fluids with the patient not eating.  Ground-level fall MRI did not show any acute stroke.  Question of chronic fracture deformity versus congenital segmental defect at the dens.  CT cervical spine ordered to further clarify.  PT and OT recommending home health  Malignant tumor of head of pancreas [facility]) Patient has a history of stage IV adenocarcinoma of the pancreas currently undergoing chemotherapy with Gemcitabine/Abraxane.  Case discussed with oncology and palliative care.  Patient still listed as a full code.  Overall prognosis poor especially if she does not eat.  Generalized weakness Patient with profound weakness that is equal throughout, likely secondary  to progressive stage IV cancer undergoing chemotherapy.  PT and OT consultation  Thrombocytopenia ([place]) Likely secondary to chemotherapy.  Platelet count up at 132.  Hypokalemia Stable and persistent hypokalemia with potassium at 3.2.  Magnesium within normal limit. -Replace potassium and monitor   Anemia of chronic disease Hemoglobin decreased to 6.8 this morning.  Chemotherapy can be contributory. Patient has anemia of chronic disease due to advanced malignancy. -Ordered 1 unit of PRBC -Monitor hemoglobin  Protein-calorie malnutrition, severe With failure to thrive.  Palliative care was consulted at patient's family would like to continue current level of care.  They were also inquiring about feeding tube. I discussed today with patient and she does not want any feeding tube      Subjective: Patient was feeling very weak and lying in bed when seen today.  Still no appetite and does not want to eat.  We discussed about feeding tube and she declined.  Physical Exam: Vitals:   06/12/22 2018 06/13/22 0425 06/13/22 0429 06/13/22 0736  BP: (!) 144/71 (!) 148/61  (!) 140/66  Pulse: 97 94  92  Resp: '17 16  18  '$ Temp: 98.6 F (37 C) 98.4 F (36.9 C)  98.5 F (36.9 C)  TempSrc: Oral Oral    SpO2: 95% 95%  100%  Weight:   48.9 kg   Height:       General.  Frail and cachectic elderly lady, in no acute distress. Pulmonary.  Lungs clear bilaterally, normal respiratory effort. CV.  Regular rate and rhythm, no JVD, rub or murmur. Abdomen.  Soft, nontender, nondistended, BS positive. CNS.  Alert and oriented .  No focal neurologic deficit. Extremities.  No edema, no cyanosis, pulses intact and symmetrical. .    Data Reviewed: Prior data reviewed  Family Communication: Discussed with daughter and son on phone.  Disposition: Status is: Inpatient  Planned Discharge Destination: Home with home health  Time spent: 42 minutes Spoke with oncology palliative care and dietitian.   Overall prognosis poor.  This record has been created using Systems analyst. Errors have been sought and corrected,but may not always be located. Such creation errors do not reflect on the standard of care.   Author: Lorella Nimrod, MD 06/13/2022 2:39 PM  For on call review [email or web address].

## 2022-06-13 NOTE — Evaluation (Signed)
Occupational Therapy Evaluation Patient Details Name: Meredith Wilcox MRN: :[id number] DOB: Jul 23, 1940 Today's Date: 06/13/2022   History of Present Illness Pt admitted for AKI with complaints of fall at home. History includes stage IV adenocarcinoma of pancreas, GERD and HLD.   Clinical Impression   Patient received supine in bed and agreeable to OT evaluation. She is currently functioning at Min-Mod A for bed mobility and Min A for functional transfers (sit<>stand, step pivot) using RW. Pt reported not feeling well once sitting at EOB, unable to describe symptoms. OT obtaining dynamap in order to take vitals, however, pt endorsed needing to use [facility] urgently. BP taken once pt sitting on BSC (164/80). RN aware. Pt with continent void on BSC and completed posterior hygiene in sitting with set up A. Pt was then returned to supine for safety. Spouse entering room and assisting pt with self-feeding of lunch tray. Pt will benefit from acute OT to increase overall independence in the areas of ADLs and functional mobility in order to safely discharge home. Anticipate pt is close to baseline and would benefit from [facility] in familiar environment with 24/7 assistance.   Recommendations for follow up therapy are one component of a multi-disciplinary discharge planning process, led by the attending physician.  Recommendations may be updated based on patient status, additional functional criteria and insurance authorization.   Follow Up Recommendations  Home health OT     Assistance Recommended at Discharge Frequent or constant Supervision/Assistance  Patient can return home with the following A lot of help with walking and/or transfers;A lot of help with bathing/dressing/bathroom;Assistance with cooking/housework;Assist for transportation;Help with stairs or ramp for entrance;Assistance with feeding    Functional Status Assessment  Patient has had a recent decline in their functional status and demonstrates the  ability to make significant improvements in function in a reasonable and predictable amount of time.  Equipment Recommendations  BSC/3in1    Recommendations for Other Services       Precautions / Restrictions Precautions Precautions: Fall Restrictions Weight Bearing Restrictions: No      Mobility Bed Mobility Overal bed mobility: Needs Assistance Bed Mobility: Supine to Sit, Sit to Supine     Supine to sit: Mod assist, HOB elevated (for trunk and BLE management) Sit to supine: Min assist (for BLE management)   General bed mobility comments: assistance required for initation of BLE movement    Transfers Overall transfer level: Needs assistance Equipment used: Rolling walker (2 wheels) Transfers: Bed to chair/wheelchair/BSC, Sit to/from Stand Sit to Stand: Min assist     Step pivot transfers: Min assist     General transfer comment: Mulitmodal cues for sequencing, assist for RW management      Balance Overall balance assessment: Needs assistance Sitting-balance support: Feet supported Sitting balance-Leahy Scale: Fair     Standing balance support: Bilateral upper extremity supported, During functional activity Standing balance-Leahy Scale: Poor                             ADL either performed or assessed with clinical judgement   ADL Overall ADL's : Needs assistance/impaired Eating/Feeding: Bed level Eating/Feeding Details (indicate cue type and reason): husband present at end of session and assisting with self-feeding Grooming: Set up;Sitting;Wash/dry face                   Toilet Transfer: BSC/3in1;Rolling walker (2 wheels);Minimal assistance   Toileting- Clothing Manipulation and Hygiene: Set up;Sitting/lateral lean;Supervision/safety Toileting - Clothing  Manipulation Details (indicate cue type and reason): for posterior hygiene after continent BM on BSC             Vision Baseline Vision/History: 1 Wears glasses Patient Visual  Report: No change from baseline       Perception     Praxis      Pertinent Vitals/Pain Pain Assessment Pain Assessment: Faces Faces Pain Scale: Hurts a little bit Pain Location: "all over" Pain Descriptors / Indicators: Discomfort Pain Intervention(s): Limited activity within patient's tolerance, Monitored during session, Repositioned     Hand Dominance     Extremity/Trunk Assessment Upper Extremity Assessment Upper Extremity Assessment: Generalized weakness   Lower Extremity Assessment Lower Extremity Assessment: Generalized weakness       Communication Communication Communication: No difficulties   Cognition Arousal/Alertness: Lethargic Behavior During Therapy: Flat affect Overall Cognitive Status: No family/caregiver present to determine baseline cognitive functioning                                 General Comments: Pt oriented to self and "[place]". Pt very lethargic/sleepy t/o. Limited/delayed responses to questions.     General Comments       Exercises Other Exercises Other Exercises: OT provided education re: role of OT, OT POC, post acute recs, sitting up for all meals, EOB/OOB mobility with assistance, home/fall safety.     Shoulder Instructions      Home Living Family/patient expects to be discharged to:: Private residence Living Arrangements: Spouse/significant other Available Help at Discharge: Family Type of Home: House       Home Layout: Two level;Bed/bath upstairs Alternate Level Stairs-Number of Steps: flight Alternate Level Stairs-Rails:  (reports 1 rail; however unsure of which side) Bathroom Shower/Tub: Teacher, early years/pre: Standard     Home Equipment: Shower seat   Additional Comments: pt is very lethargic and questionable historian. Unsure of accuracy      Prior Functioning/Environment Prior Level of Function : Needs assist             Mobility Comments: pt is poor historian. Reports she had  only had 1 fall that resulted in admission ADLs Comments: family assist with feeding        OT Problem List: Pain;Decreased strength;Decreased activity tolerance;Impaired balance (sitting and/or standing);Decreased cognition      OT Treatment/Interventions: Self-care/ADL training;Therapeutic exercise;Neuromuscular education;Energy conservation;DME and/or AE instruction;Manual therapy;Modalities;Balance training;Patient/family education;Visual/perceptual remediation/compensation;Therapeutic activities;Cognitive remediation/compensation;Splinting    OT Goals(Current goals can be found in the care plan section) Acute Rehab OT Goals Patient Stated Goal: return home OT Goal Formulation: With patient Time For Goal Achievement: 06/27/22 Potential to Achieve Goals: Fair   OT Frequency: Min 2X/week    Co-evaluation              AM-PAC OT "6 Clicks" Daily Activity     Outcome Measure Help from another person eating meals?: A Lot Help from another person taking care of personal grooming?: A Little Help from another person toileting, which includes using toliet, bedpan, or urinal?: A Little Help from another person bathing (including washing, rinsing, drying)?: A Lot Help from another person to put on and taking off regular upper body clothing?: A Little Help from another person to put on and taking off regular lower body clothing?: A Little 6 Click Score: 16   End of Session Equipment Utilized During Treatment: Gait belt;Rolling walker (2 wheels) Nurse Communication: Mobility status;Other (comment) (BM on  BSC)  Activity Tolerance: Patient limited by fatigue Patient left: in bed;with call bell/phone within reach;with bed alarm set;with family/visitor present  OT Visit Diagnosis: Unsteadiness on feet (R26.81);Muscle weakness (generalized) (M62.81);Pain                Time: [id number] OT Time Calculation (min): 40 min Charges:  OT General Charges $OT Visit: 1 Visit OT Evaluation $OT  Eval Low Complexity: 1 Low OT Treatments $Self Care/Home Management : 8-22 mins  [facility] MS, OTR/L ascom [phone]  06/13/22, 3:24 PM

## 2022-06-14 DIAGNOSIS — Z515 Encounter for palliative care: Secondary | ICD-10-CM | POA: Diagnosis not present

## 2022-06-14 DIAGNOSIS — W1830XA Fall on same level, unspecified, initial encounter: Secondary | ICD-10-CM | POA: Diagnosis not present

## 2022-06-14 DIAGNOSIS — C250 Malignant neoplasm of head of pancreas: Secondary | ICD-10-CM | POA: Diagnosis not present

## 2022-06-14 DIAGNOSIS — R531 Weakness: Secondary | ICD-10-CM | POA: Diagnosis not present

## 2022-06-14 DIAGNOSIS — N179 Acute kidney failure, unspecified: Secondary | ICD-10-CM | POA: Diagnosis not present

## 2022-06-14 MED ORDER — LACTATED RINGERS IV SOLN: INTRAVENOUS | Status: DC

## 2022-06-14 NOTE — TOC Initial Note (Signed)
Transition of Care [facility]) - Initial/Assessment Note    Patient Details  Name: Meredith Wilcox MRN: [place]:[id number] Date of Birth: 1940-04-20  [facility]) CM/SW Contact:    Beverly Sessions, RN Phone Number: 06/14/2022, 9:36 AM  Clinical Narrative:                  Admitted for: aki, fall, hx pancreatic cancer Admitted from: home with husband  PCP: [facility] Current home health/prior home health/DME:NA  Therapy recommending home health. Patient currently has a visitor in the room and is in agreement for me to call her spouse.  Spouse in agreement to home health, but defers for me to call daughter Elmo Putt.  Elmo Putt to review medicare.gov list and call me back with decision on home health agency.  She is in agreement to RW and [facility]  Per MD palliative and oncology to meet with patient and family again to discuss goals of care.  Will await outcome of meeting prior to delivery of DME     Barriers to Discharge: Continued Medical Work up   Patient Goals and CMS Choice            Expected Discharge Plan and Services                                              Prior Living Arrangements/Services                       Activities of Daily Living Home Assistive Devices/Equipment: Eyeglasses, Shower chair with back ADL Screening (condition at time of admission) Patient's cognitive ability adequate to safely complete daily activities?: Yes Is the patient deaf or have difficulty hearing?: No Does the patient have difficulty seeing, even when wearing glasses/contacts?: Yes Does the patient have difficulty concentrating, remembering, or making decisions?: Yes Patient able to express need for assistance with ADLs?: Yes Does the patient have difficulty dressing or bathing?: Yes Independently performs ADLs?: Yes (appropriate for developmental age) Does the patient have difficulty walking or climbing stairs?: Yes Weakness of Legs: Both Weakness of Arms/Hands:  Both  Permission Sought/Granted                  Emotional Assessment              Admission diagnosis:  Generalized weakness [R53.1] AKI (acute kidney injury) ([place]) [N17.9] Acute kidney injury ([place]) [N17.9] Patient Active Problem List   Diagnosis Date Noted   Protein-calorie malnutrition, severe 06/11/2022   Anemia of chronic disease 06/11/2022   Hypokalemia 06/11/2022   Acute kidney injury ([place]) 06/11/2022   Palliative care encounter 06/11/2022   AKI (acute kidney injury) ([place]) 06/10/2022   Generalized weakness 06/10/2022   Ground-level fall 06/10/2022   Thrombocytopenia ([place]) 06/10/2022   Malignant tumor of head of pancreas ([place]) 03/15/2022   Other neutropenia ([place]) 11/16/2021   Primary osteoarthritis of left knee 11/16/2021   Obstructive jaundice 07/07/2021   Abnormal CT of the pancreas 07/07/2021   GERD (gastroesophageal reflux disease) 07/07/2021   Elevated blood pressure reading with diagnosis of hypertension 07/07/2021   Pancreatic mass    Vaginal discharge 01/27/2017   Urethral caruncle 12/21/2016   Cystocele, midline 12/21/2016   Vaginal atrophy 12/21/2016   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   Benign migrating glossitis [id number]   History of Helicobacter pylori infection 11/16/2014  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 11/16/2014   Incontinence 11/16/2014   Prolapse of urethra 11/16/2014   PCP:  [name], MD Pharmacy:   [place], [place] [address] Phone: [phone] Fax: [phone]     Social Determinants of Health (SDOH) Social History: SDOH Screenings   Food Insecurity: No Food Insecurity (06/10/2022)  Housing: Low Risk  (06/10/2022)  Transportation Needs: No Transportation Needs (06/10/2022)  Utilities: Not At Risk (06/10/2022)  Alcohol Screen: Low Risk  (11/08/2019)  Depression (PHQ2-9): Low Risk  (03/15/2022)  Financial Resource Strain: Low Risk   (12/31/2021)  Physical Activity: Sufficiently Active (12/31/2021)  Social Connections: Socially Integrated (12/31/2021)  Stress: No Stress Concern Present (12/31/2021)  Tobacco Use: Low Risk  (06/10/2022)   SDOH Interventions:     Readmission Risk Interventions     No data to display

## 2022-06-14 NOTE — Care Management Important Message (Signed)
Important Message  Patient Details  Name: Meredith Wilcox MRN: [id number] Date of Birth: 01-Jul-1940   Medicare Important Message Given:  Yes     Loann Quill 06/14/2022, 3:44 PM

## 2022-06-14 NOTE — Progress Notes (Signed)
Progress Note   Meredith Wilcox: Meredith Wilcox [id number] DOB: 1940-12-28 DOA: 06/10/2022     3 DOS: the Meredith Wilcox was seen and examined on 06/14/2022   Brief hospital course: 82 y.o. 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   Meredith Wilcox states that 2-3 days ago, she was walking in her living room to plug in a radio. When she bent over to pick up the cable, she felt herself falling down onto the ground.  She does not believe that she hit her head or lost consciousness.  Prior to the fall, she denies any dizziness, chest pain, shortness of breath or palpitations.  After the fall, she notes that she was so weak she was unable to stand up on her own.  She states generalized weakness has been ongoing and is worse after chemotherapy, which her last infusion was on 06/03/2022. She laid on the ground for Meredith least 2 hours.  She cannot recall how she was able to stand up or if someone helped her stand up.  Her husband Meredith Wilcox states he was not the one to help her up and he does not know how she got up off the ground.  Since that time, she has been experiencing increasing generalized weakness and decreased appetite.  She has not eaten in Meredith least 2 days and has had very little water.  Due to this, she decided to come to the ER today.  Meredith this time, Meredith Wilcox denies any fever, chills, nausea, vomiting, diarrhea, abdominal pain, chest pain, shortness of breath or palpitations.  She notes that she has had a cough since starting chemotherapy but her sputum states seems thicker and more difficult to swallow in the last few days.   Per chart review, Meredith Wilcox's daughter found her mother on the ground on the evening of 2/28.  3/1.  Meredith Wilcox not eating.  I will continue IV fluids.  Awaiting palliative care consultation.  Repeat chest x-ray to see if pneumonia.  3/2: Mild hypothermia this morning,  maximum temperature recorded was 101 over the past 24 hours.  Repeat chest x-ray with bilateral interstitial opacities, which may represent pulmonary venous congestion or atypical infection.  There was also noted to have a retrocardiac opacity which can be pneumonia.  CT cervical spine was negative for any acute abnormality.  Hemoglobin decreased to 6.8, BMP with mild hyponatremia Meredith 146, potassium low Meredith 3.2, AKI resolved.  Continuing IV fluid. Palliative care with current scope of care, family to decide about CODE STATUS. Had another discussion and they will continue to keep current level of care, 1 unit of PRBC ordered.  Family was also inquiring about tube feeding.  3/3: Vital stable with hemoglobin improvement to 9.1, slight worsening of hypernatremia Meredith 147 with water deficit of 1.1 L.  Giving some D5.  Potassium normalized Meredith 3.6. P.o. intake remained poor.  Meredith Wilcox does not want feeding tube.  We will remove Foley catheter today to give her a voiding trial. Family would like to hear from Dr. Grayland Ormond regarding further management and prognosis.  3/4: Meredith Wilcox remained lethargic with poor p.o. intake.  Stating that she does not have much appetite.  Palliative care met with family but they have not decided about the CODE STATUS and would like to give her maximum chances.  They were insisting on placing NG tube for artificial feeding.  Discussed with Dr. Grayland Ormond from oncology and she will  not be a good candidate as it will not change the disease trajectory.  Meredith Wilcox will be high risk for aspiration due to worsening weakness and lethargy, Meredith times she is unable to clear her own secretions.  Nausea and vomiting are very common features of advance pancreatic malignancy.  Dr. Maryjane Hurter will see her later today.  Meredith Wilcox is approaching end-of-life and appropriate for hospice and comfort care only.  Meredith Wilcox is very high risk for deterioration and mortality.  Assessment and Plan: * AKI (acute kidney injury)  ([place]) Resolved with IV fluid. Creatinine 1.61 on presentation  - Continue IV fluids with the Meredith Wilcox not eating.  Ground-level fall MRI did not show any acute stroke.  Question of chronic fracture deformity versus congenital segmental defect Meredith the dens.  CT cervical spine ordered to further clarify.  PT and OT recommending home health  Malignant tumor of head of pancreas [facility]) Meredith Wilcox has a history of stage IV adenocarcinoma of the pancreas currently undergoing chemotherapy with Gemcitabine/Abraxane.  Case discussed with oncology and palliative care.  Meredith Wilcox still listed as a full code.  Overall prognosis poor especially if she does not eat.  Generalized weakness Meredith Wilcox with profound weakness that is equal throughout, likely secondary to progressive stage IV cancer undergoing chemotherapy.  PT and OT consultation  Thrombocytopenia ([place]) Likely secondary to chemotherapy.  Platelet count up Meredith 132.  Hypokalemia Stable and persistent hypokalemia with potassium Meredith 3.2.  Magnesium within normal limit. -Replace potassium and monitor   Anemia of chronic disease Hemoglobin decreased to 6.8 this morning.  Chemotherapy can be contributory. Meredith Wilcox has anemia of chronic disease due to advanced malignancy. -Ordered 1 unit of PRBC -Monitor hemoglobin  Protein-calorie malnutrition, severe With failure to thrive.  Palliative care was consulted Meredith Meredith Wilcox's family would like to continue current level of care.  They were also inquiring about feeding tube. I discussed today with Meredith Wilcox and she does not want any feeding tube      Subjective: Meredith Wilcox was seen and examined today.  She was in some of her strength back.  Appetite and p.o. intake remained poor.  A friend from church was visiting and Meredith Wilcox.  Physical Exam: Vitals:   06/13/22 2130 06/14/22 0359 06/14/22 0500 06/14/22 0724  BP: 139/68 (!) 154/64  (!) 146/68  Pulse: 90 93  95  Resp: 18 16    Temp: 98.6 F (37 C) 98.5 F  (36.9 C)  98.5 F (36.9 C)  TempSrc: Oral Oral  Oral  SpO2: 96% 95%  96%  Weight:   53.5 kg   Height:       General.  Chronically ill-appearing and cachectic elderly lady, in no acute distress. Pulmonary.  Lungs clear bilaterally, normal respiratory effort. CV.  Regular rate and rhythm, no JVD, rub or murmur. Abdomen.  Soft, nontender, nondistended, BS positive. CNS.  Alert and oriented .  No focal neurologic deficit. Extremities.  No edema, no cyanosis, pulses intact and symmetrical. Psychiatry.  Judgment and insight appears normal. .     Data Reviewed: Prior data reviewed  Family Communication: Called daughter with no response.  Palliative care already met with family today.  Disposition: Status is: Inpatient  Planned Discharge Destination: Home with home health  Time spent: 40 minutes Spoke with oncology palliative care and dietitian.  Overall prognosis poor.  This record has been created using Systems analyst. Errors have been sought and corrected,but may not always be located. Such creation errors do not reflect on the standard  of care.   Author: Lorella Nimrod, MD 06/14/2022 2:18 PM  For on call review [email or web address].

## 2022-06-14 NOTE — Progress Notes (Signed)
Patient responds appropriately but keeps eyes closed. Very soft spoken and appears weak. Has dysphagia and is an aspiration risk. Wet productive cough that produces yellow sputum. Denied pain. Foley was removed on day shift and patient has urinated since removal.

## 2022-06-14 NOTE — Progress Notes (Signed)
OT Cancellation Note  Patient Details Name: Meredith Wilcox MRN: [place]:[id number] DOB: 09-04-1940   Cancelled Treatment:    Reason Eval/Treat Not Completed: Fatigue/lethargy limiting ability to participate. Pt received in bed, lying very still, eyes closed, entire body tucked in to covers up to pt's neck, visitor in room. Pt stated she was comfortable in bed and did not want to move, was feeling too tired. Will attempt rehab services at a later date, as appropriate.  Josiah Lobo 06/14/2022, 2:30 PM

## 2022-06-14 NOTE — Progress Notes (Signed)
Patient is not able to walk the distance required to go the bathroom, or he/she is unable to safely negotiate stairs required to access the bathroom.  A 3in1 BSC will alleviate this problem  

## 2022-06-14 NOTE — Progress Notes (Signed)
Physical Therapy Treatment Patient Details Name: Meredith Wilcox MRN: [id number] DOB: 1940-11-03 Today's Date: 06/14/2022   History of Present Illness Pt admitted for AKI with complaints of fall at home. History includes stage IV adenocarcinoma of pancreas, GERD and HLD.    PT Comments    Pt is making gradual progress towards goals with ability to step pivot transfer from bed->chair. Needs cues for initiation of mobility and RW. Still continues to be limited by lethargy and keeps eyes closed during majority of session. Limited there-ex performed. Per visitors at bedside, pt with very little intake. Would anticipate pt would be more comfortable and make progress in familiar environment. Do not anticipate pt will be able to tolerate intensive PT at a SNF.  Palliative care in session to observe. Will continue to progress here while hospitalized.    Recommendations for follow up therapy are one component of a multi-disciplinary discharge planning process, led by the attending physician.  Recommendations may be updated based on patient status, additional functional criteria and insurance authorization.  Follow Up Recommendations  Home health PT     Assistance Recommended at Discharge Frequent or constant Supervision/Assistance  Patient can return home with the following A lot of help with walking and/or transfers;A lot of help with bathing/dressing/bathroom;Help with stairs or ramp for entrance   Equipment Recommendations  Rolling walker (2 wheels);BSC/3in1    Recommendations for Other Services       Precautions / Restrictions Precautions Precautions: Fall Restrictions Weight Bearing Restrictions: No     Mobility  Bed Mobility Overal bed mobility: Needs Assistance Bed Mobility: Supine to Sit, Sit to Supine     Supine to sit: Mod assist     General bed mobility comments: needs initiation for moving B LEs across bed. ONce sitting, very unsteady with increased trunkal sway. Able to self  correct with cues for hand placement on bed. Progresses to supervision.    Transfers Overall transfer level: Needs assistance Equipment used: Rolling walker (2 wheels) Transfers: Bed to chair/wheelchair/BSC, Sit to/from Stand Sit to Stand: Mod assist   Step pivot transfers: Min guard       General transfer comment: able to step pivot over to recliner. Does need assist for initiation of standing with cues for hand placement.    Ambulation/Gait               General Gait Details: not safe due to lethargy   Stairs             Wheelchair Mobility    Modified Rankin (Stroke Patients Only)       Balance Overall balance assessment: Needs assistance Sitting-balance support: Feet supported Sitting balance-Leahy Scale: Fair     Standing balance support: Bilateral upper extremity supported, During functional activity Standing balance-Leahy Scale: Poor                              Cognition Arousal/Alertness: Lethargic Behavior During Therapy: Flat affect Overall Cognitive Status: Impaired/Different from baseline                                 General Comments: very withdrawn, responds to cues, however keepes eyes closed during session        Exercises Other Exercises Other Exercises: supine ther-ex performed on B LE including AP, SAQ, and SLR. 5-8 reps with inconsistent effort noted. Min/mod assist required  General Comments        Pertinent Vitals/Pain Pain Assessment Pain Assessment: Faces Faces Pain Scale: Hurts little more Pain Location: "all over" Pain Descriptors / Indicators: Discomfort Pain Intervention(s): Limited activity within patient's tolerance, Repositioned    Home Living                          Prior Function            PT Goals (current goals can now be found in the care plan section) Acute Rehab PT Goals Patient Stated Goal: unable to state PT Goal Formulation: Patient unable to  participate in goal setting Time For Goal Achievement: 06/26/22 Potential to Achieve Goals: Fair Progress towards PT goals: Progressing toward goals    Frequency    Min 2X/week      PT Plan Current plan remains appropriate    Co-evaluation              AM-PAC PT "6 Clicks" Mobility   Outcome Measure  Help needed turning from your back to your side while in a flat bed without using bedrails?: A Little Help needed moving from lying on your back to sitting on the side of a flat bed without using bedrails?: A Lot Help needed moving to and from a bed to a chair (including a wheelchair)?: A Little Help needed standing up from a chair using your arms (e.g., wheelchair or bedside chair)?: A Lot Help needed to walk in hospital room?: A Lot Help needed climbing 3-5 steps with a railing? : A Lot 6 Click Score: 14    End of Session   Activity Tolerance: Patient limited by lethargy Patient left: in chair;with family/visitor present Nurse Communication: Mobility status PT Visit Diagnosis: Unsteadiness on feet (R26.81);Muscle weakness (generalized) (M62.81);History of falling (Z91.81);Difficulty in walking, not elsewhere classified (R26.2)     Time: 1045-1101 PT Time Calculation (min) (ACUTE ONLY): 16 min  Charges:  $Therapeutic Exercise: 8-22 mins                     Greggory Stallion, PT, DPT, GCS [phone]    Meredith Wilcox 06/14/2022, 12:36 PM

## 2022-06-14 NOTE — Consult Note (Signed)
[place]  [phone] Fax:(336) [phone]  ID: Meredith Wilcox OB: [birth year]  MR#: [id number]  [id number]  Patient Care Team: [name], MD as PCP - General (Family Medicine) Birder Robson, MD as Consulting Physician (Ophthalmology) Clent Jacks, RN as Oncology Nurse Navigator Grayland Ormond, Kathlene November, MD as Consulting Physician (Oncology)  CHIEF COMPLAINT: Stage IV pancreatic cancer, declining performance status.  INTERVAL HISTORY: Patient is an 82 year old female actively receiving chemotherapy for stage IV pancreatic cancer who was admitted to the hospital last night after profound weakness and fatigue and being found on the ground by her daughter for an unknown period of time.  Patient denies any loss of consciousness or injury.  She continues to have a decreased performance status and profound weakness and fatigue.  She is lethargic, but easily awoken.  She has no neurologic complaints.  She denies any fevers.  She has a poor appetite and continued weight loss.  She does not complain of any nausea, vomiting, constipation, or diarrhea.  She has no chest pain, shortness of breath, cough, or hemoptysis.  She has no urinary complaints.  Patient offers no further specific complaints today.  REVIEW OF SYSTEMS:   Review of Systems  Constitutional:  Positive for malaise/fatigue and weight loss. Negative for fever.  Respiratory: Negative.  Negative for cough, hemoptysis and shortness of breath.   Cardiovascular: Negative.  Negative for chest pain and leg swelling.  Gastrointestinal: Negative.  Negative for abdominal pain, diarrhea, nausea and vomiting.  Genitourinary: Negative.  Negative for dysuria.  Musculoskeletal: Negative.  Negative for back pain.  Skin: Negative.  Negative for rash.  Neurological:  Positive for weakness. Negative for dizziness, focal weakness and headaches.  Psychiatric/Behavioral: Negative.  The patient is not nervous/anxious.      As per HPI. Otherwise, a complete review of systems is negative.  PAST MEDICAL HISTORY: Past Medical History:  Diagnosis Date   Anemia    H/O DURING PREGNANCY   Arthritis    Chronic kidney disease    KIDNEY PROBLEMS AROUND AGE 66   Geographic tongue    GERD (gastroesophageal reflux disease)    H/O NO MEDS   Helicobacter pylori gastrointestinal tract infection    Hyperlipidemia    Incontinence    Indigestion    Prolapse of vaginal walls    Right sided sciatica    Tachycardia    Urethral prolapse     PAST SURGICAL HISTORY: Past Surgical History:  Procedure Laterality Date   BREAST BIOPSY Left 10/11/2016   left breast stereo/ VASCULAR LESION   BREAST BIOPSY Left 12/02/2016   Procedure: NEEDLE LOCALIZATION;  Surgeon: [name], MD;  Location: [facility] ORS;  Service: General;  Laterality: Left;   BREAST EXCISIONAL BIOPSY Left 12/02/2016   left lumpectomy   BREAST LUMPECTOMY Left 12/02/2016   Procedure: EXCISION BREAST MASS;  Surgeon: [name], MD;  Location: [facility] ORS;  Service: General;  Laterality: Left;   DILATION AND CURETTAGE OF UTERUS     dnc     1983   ERCP N/A 07/07/2021   Procedure: ENDOSCOPIC RETROGRADE CHOLANGIOPANCREATOGRAPHY (ERCP);  Surgeon: [name], MD;  Location: [facility] ENDOSCOPY;  Service: Endoscopy;  Laterality: N/A;   ERCP N/A 10/01/2021   Procedure: ENDOSCOPIC RETROGRADE CHOLANGIOPANCREATOGRAPHY (ERCP);  Surgeon: [name], MD;  Location: [facility] ENDOSCOPY;  Service: Endoscopy;  Laterality: N/A;   fatty tumor removal Right    hand   IR IMAGING GUIDED PORT INSERTION  04/06/2022    FAMILY HISTORY: Family History  Problem Relation Age of Onset   Diabetes Mother    Hypertension Mother    Cardiomyopathy Sister    Down syndrome Sister    Leukemia Brother    Alcohol abuse Son        youngest son   Asthma Son        youngest son   Rheum arthritis Sister    Drug abuse Child 40   Breast cancer Neg Hx    Kidney cancer Neg Hx     Bladder Cancer Neg Hx    Prostate cancer Neg Hx     ADVANCED DIRECTIVES (Y/N):  '@ADVDIR'$ @  HEALTH MAINTENANCE: Social History   Tobacco Use   Smoking status: Never   Smokeless tobacco: Never   Tobacco comments:    smoking cessation materials not required  Vaping Use   Vaping Use: Never used  Substance Use Topics   Alcohol use: No    Alcohol/week: 0.0 standard drinks of alcohol   Drug use: No     Colonoscopy:  PAP:  Bone density:  Lipid panel:  Allergies  Allergen Reactions   Tussionex Pennkinetic Er [Hydrocod Poli-Chlorphe Poli Er] Other (See Comments)    Real dizzy headed even after a while still walking every direction but straight, patient stated she stopped taking it because of that.   Hydrocodone     Light headed and dizziness    Current Facility-Administered Medications  Medication Dose Route Frequency Provider Last Rate Last Admin   Ampicillin-Sulbactam (UNASYN) 3 g in sodium chloride 0.9 % 100 mL IVPB  3 g Intravenous Q6H Lockie Mola B, RPH 200 mL/hr at 06/14/22 1507 3 g at 06/14/22 1507   Chlorhexidine Gluconate Cloth 2 % PADS 6 each  6 each Topical Q0600 Jose Persia, MD   6 each at 06/14/22 0403   feeding supplement (KATE FARMS STANDARD 1.4) liquid 325 mL  325 mL Oral TID BM Loletha Grayer, MD   325 mL at 06/13/22 2151   lactated ringers infusion   Intravenous Continuous Lorella Nimrod, MD 50 mL/hr at 06/14/22 1501 New Bag at 06/14/22 1501   multivitamin with minerals tablet 1 tablet  1 tablet Oral Daily Jose Persia, MD       ondansetron (ZOFRAN) tablet 4 mg  4 mg Oral Q6H PRN Jose Persia, MD       Or   ondansetron (ZOFRAN) injection 4 mg  4 mg Intravenous Q6H PRN Jose Persia, MD       scopolamine (TRANSDERM-SCOP) 1 MG/3DAYS 1.5 mg  1 patch Transdermal Q72H Amin, Soundra Pilon, MD   1.5 mg at 06/12/22 1537   sodium chloride flush (NS) 0.9 % injection 3 mL  3 mL Intravenous Q12H Jose Persia, MD   3 mL at 06/14/22 0919   thiamine (VITAMIN B1)  injection 100 mg  100 mg Intravenous Daily Jose Persia, MD   100 mg at 06/14/22 0918    OBJECTIVE: Vitals:   06/14/22 0359 06/14/22 0724  BP: (!) 154/64 (!) 146/68  Pulse: 93 95  Resp: 16   Temp: 98.5 F (36.9 C) 98.5 F (36.9 C)  SpO2: 95% 96%     Body mass index is 19.63 kg/m.    ECOG FS:4 - Bedbound  General: Ill-appearing, no acute distress. Eyes: Pink conjunctiva, anicteric sclera. HEENT: Normocephalic, moist mucous membranes. Lungs: No audible wheezing or coughing. Heart: Regular rate and rhythm. Abdomen: Soft, nontender, no obvious distention. Musculoskeletal: No edema, cyanosis, or clubbing. Neuro: Lethargic, but easily awoken. Cranial nerves grossly intact. Skin:  No rashes or petechiae noted. Psych: Flat affect.  LAB RESULTS:  Lab Results  Component Value Date   NA 147 (H) 06/13/2022   K 3.6 06/13/2022   CL 113 (H) 06/13/2022   CO2 28 06/13/2022   GLUCOSE 117 (H) 06/13/2022   BUN 18 06/13/2022   CREATININE 0.85 06/13/2022   CALCIUM 8.5 (L) 06/13/2022   PROT 6.3 (L) 06/12/2022   ALBUMIN 1.8 (L) 06/12/2022   AST 53 (H) 06/12/2022   ALT 26 06/12/2022   ALKPHOS 283 (H) 06/12/2022   BILITOT 1.2 06/12/2022   GFRNONAA >60 06/13/2022   GFRAA 84 08/13/2020    Lab Results  Component Value Date   WBC 7.7 06/13/2022   NEUTROABS 6.8 06/11/2022   HGB 9.1 (L) 06/13/2022   HCT 27.1 (L) 06/13/2022   MCV 84.2 06/13/2022   PLT 131 (L) 06/13/2022     STUDIES: CT CERVICAL SPINE WO CONTRAST  Result Date: 06/11/2022 CLINICAL DATA:  Neck trauma (Age >= 65y) EXAM: CT CERVICAL SPINE WITHOUT CONTRAST TECHNIQUE: Multidetector CT imaging of the cervical spine was performed without intravenous contrast. Multiplanar CT image reconstructions were also generated. RADIATION DOSE REDUCTION: This exam was performed according to the departmental dose-optimization program which includes automated exposure control, adjustment of the mA and/or kV according to patient size and/or  use of iterative reconstruction technique. COMPARISON:  None Available. FINDINGS: Alignment: No substantial sagittal subluxation. Skull base and vertebrae: No evidence of acute fracture. Vertebral body heights are maintained. Soft tissues and spinal canal: No prevertebral fluid or swelling. No visible canal hematoma. Disc levels: Severe multilevel degenerative change. This includes severe multilevel degenerative disease as well as multilevel facet/uncovertebral hypertrophy with varying degrees of neural foraminal stenosis. Upper chest: Biapical pleuroparenchymal scarring. IMPRESSION: 1. No evidence of acute fracture or traumatic malalignment. 2. Severe multilevel degenerative change. Electronically Signed   By: Margaretha Sheffield M.D.   On: 06/11/2022 12:31   DG Chest Port 1 View  Result Date: 06/11/2022 CLINICAL DATA:  Cough EXAM: PORTABLE CHEST 1 VIEW COMPARISON:  CXR 06/10/22 FINDINGS: Right-sided chest port in place with tip at the cavoatrial junction. No pleural effusion. No pneumothorax. Unchanged cardiac and mediastinal contours. There are prominent bilateral interstitial opacities, which are nonspecific and may represent pulmonary venous congestion or atypical infection. There may also be a focal retrocardiac opacity. No radiographically apparent displaced rib fractures. Visualized upper abdomen is unremarkable. IMPRESSION: Prominent bilateral interstitial opacities, which are nonspecific and may represent pulmonary venous congestion or atypical infection. There also may be a retrocardiac opacity which could represent a pneumonia. Recommend two-view chest radiograph for further evaluation. Electronically Signed   By: Marin Roberts M.D.   On: 06/11/2022 11:44   US RENAL  Result Date: 06/11/2022 CLINICAL DATA:  Acute knee injury, history of chronic kidney disease, GERD EXAM: RENAL / URINARY TRACT ULTRASOUND COMPLETE COMPARISON:  CT abdomen 01/18/2022 FINDINGS: Right Kidney: Renal measurements: 11.5 x 3.8 x  6.5 cm = volume: 149 mL. Normal cortical thickness. Increased cortical echogenicity. No mass, hydronephrosis, or shadowing calcification. Trace perinephric fluid RIGHT kidney. Left Kidney: Renal measurements: 10.3 x 4.3 x 5.3 cm = volume: 123 mL. Normal cortical thickness. Increased cortical echogenicity. No mass, hydronephrosis, or shadowing calcification. Bladder: Foley catheter within urinary bladder. Bladder contains minimal urine, poorly assessed. Other: Small BILATERAL pleural effusions.  Mild ascites. IMPRESSION: Medical renal disease changes of both kidneys. No evidence of renal mass or hydronephrosis. Trace perinephric fluid RIGHT kidney. Small BILATERAL pleural effusions and mild ascites. Electronically  Signed   By: Lavonia Dana M.D.   On: 06/11/2022 09:25   MR BRAIN WO CONTRAST  Result Date: 06/10/2022 CLINICAL DATA:  Initial evaluation for mental status change, unknown cause. EXAM: MRI HEAD WITHOUT CONTRAST TECHNIQUE: Multiplanar, multiecho pulse sequences of the brain and surrounding structures were obtained without intravenous contrast. COMPARISON:  None available. FINDINGS: Brain: Cerebral volume within normal limits. No significant cerebral white matter disease for age. No evidence for acute or subacute ischemia. Gray-white matter differentiation maintained. No areas of chronic cortical infarction. No acute or chronic intracranial blood products. No mass lesion, midline shift or mass effect no hydrocephalus or extra-axial fluid collection. Pituitary gland and suprasellar region within normal limits. Vascular: Major intracranial vascular flow voids are maintained. Skull and upper cervical spine: Question chronic fracture deformity versus congenital segmental defect at the dens (series 9, image 11). Diffuse loss of normal bone marrow signal, nonspecific but can be seen with anemia, smoking, obesity, and infiltrative/myelofibrotic marrow processes. No focal marrow replacing lesion. No scalp soft  tissue abnormality. Sinuses/Orbits: Globes and orbital soft tissues demonstrate no acute finding. Paranasal sinuses are largely clear. Trace bilateral mastoid effusions, of doubtful significance. Other: None. IMPRESSION: 1. Normal brain MRI for age. No acute intracranial abnormality identified. 2. Question chronic fracture deformity versus congenital segmental defect at the dens. Finding could be further assessed with dedicated CT of the cervical spine as warranted. Electronically Signed   By: Jeannine Boga M.D.   On: 06/10/2022 20:08   DG Chest Port 1 View  Result Date: 06/10/2022 CLINICAL DATA:  Cough EXAM: PORTABLE CHEST 1 VIEW COMPARISON:  CXR 05/18/21 FINDINGS: Right-sided central venous catheter with tip at the cavoatrial junction. No pleural effusion. No pneumothorax. Unchanged cardiac and mediastinal contours. No focal airspace opacity. No radiographically apparent displaced rib fractures. Visualized upper abdomen is unremarkable. Degenerative changes of the right AC joint. IMPRESSION: No focal airspace opacity. Electronically Signed   By: Marin Roberts M.D.   On: 06/10/2022 12:09    ASSESSMENT: Stage IV pancreatic cancer, declining performance status.  PLAN:    Stage IV pancreatic cancer: Patient last received chemotherapy with gemcitabine and Abraxane on June 03, 2022.  Given her declining performance status and profound weakness and fatigue, no further treatments are planned at this time.  Patient expressed understanding and stated she is currently interested in comfort measures only.  She has not yet agreed to hospice.  Case was also discussed with her daughter who expressed understanding of the situation and agreed that additional chemotherapy would likely make things worse.  She is not entirely sure of hospice yet and would like to try a nasogastric feeding tube to see if patient's strength improves.  We discussed how this likely would not change the overall outcome and goes against  her mother's wishes of comfort. Poor appetite: Daughter expressed understanding that PEG tube is not an option, plus patient has declined.  The daughter wishes to pursue NG tube with feeds to see if performance status improves. Anemia: Improved.  Monitor. Disposition: Patient remains full code and have recommended that family change her status to DNR/DNI, but they wish to discuss it further.  Appreciate palliative care input.  Will follow.   Lloyd Huger, MD   06/14/2022 3:19 PM

## 2022-06-14 NOTE — Progress Notes (Signed)
[place] [facility] Telephone:([phone]) [phone] Fax:(336) [phone]   Name: Meredith Wilcox Date: 06/14/2022 MRN: [id number]  DOB: [birth year]  Patient Care Team: [name], MD as PCP - General (Family Medicine) Birder Robson, MD as Consulting Physician (Ophthalmology) Clent Jacks, RN as Oncology Nurse Navigator Grayland Ormond, Kathlene November, MD as Consulting Physician (Oncology)    REASON FOR CONSULTATION: Meredith Wilcox is a 82 y.o. 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  Patient was admitted to hospital on 06/10/2022 after having a fall at home.  She is on treatment for possible pneumonia.  Palliative care was consulted to address goals. .   CODE STATUS: Full code  PAST MEDICAL HISTORY: Past Medical History:  Diagnosis Date   Anemia    H/O DURING PREGNANCY   Arthritis    Chronic kidney disease    KIDNEY PROBLEMS AROUND AGE 16   Geographic tongue    GERD (gastroesophageal reflux disease)    H/O NO MEDS   Helicobacter pylori gastrointestinal tract infection    Hyperlipidemia    Incontinence    Indigestion    Prolapse of vaginal walls    Right sided sciatica    Tachycardia    Urethral prolapse     PAST SURGICAL HISTORY:  Past Surgical History:  Procedure Laterality Date   BREAST BIOPSY Left 10/11/2016   left breast stereo/ VASCULAR LESION   BREAST BIOPSY Left 12/02/2016   Procedure: NEEDLE LOCALIZATION;  Surgeon: [name], MD;  Location: [facility] ORS;  Service: General;  Laterality: Left;   BREAST EXCISIONAL BIOPSY Left 12/02/2016   left lumpectomy   BREAST LUMPECTOMY Left 12/02/2016   Procedure: EXCISION BREAST MASS;  Surgeon: [name], MD;  Location: [facility] ORS;  Service: General;  Laterality: Left;   DILATION AND CURETTAGE OF UTERUS     dnc     1983   ERCP N/A 07/07/2021   Procedure: ENDOSCOPIC RETROGRADE  CHOLANGIOPANCREATOGRAPHY (ERCP);  Surgeon: [name], MD;  Location: [facility] ENDOSCOPY;  Service: Endoscopy;  Laterality: N/A;   ERCP N/A 10/01/2021   Procedure: ENDOSCOPIC RETROGRADE CHOLANGIOPANCREATOGRAPHY (ERCP);  Surgeon: [name], MD;  Location: [facility] ENDOSCOPY;  Service: Endoscopy;  Laterality: N/A;   fatty tumor removal Right    hand   IR IMAGING GUIDED PORT INSERTION  04/06/2022    HEMATOLOGY/ONCOLOGY HISTORY:  Oncology History  Malignant tumor of head of pancreas ([place])  03/15/2022 Initial Diagnosis   Malignant tumor of head of pancreas ([place])   03/26/2022 Cancer Staging   Staging form: Exocrine Pancreas, AJCC 8th Edition - Clinical stage from 03/26/2022: Stage IV (cT2, cN0, cM1) - Signed by Lloyd Huger, MD on 03/26/2022 Total positive nodes: 0   04/08/2022 -  Chemotherapy   Patient is on Treatment Plan : PANCREATIC Abraxane D1,8,15 + Gemcitabine D1,8,15 q28d       ALLERGIES:  is allergic to tussionex pennkinetic er [hydrocod poli-chlorphe poli er] and hydrocodone.  MEDICATIONS:  Current Facility-Administered Medications  Medication Dose Route Frequency Provider Last Rate Last Admin   Ampicillin-Sulbactam (UNASYN) 3 g in sodium chloride 0.9 % 100 mL IVPB  3 g Intravenous Q6H Lockie Mola B, RPH 200 mL/hr at 06/14/22 0930 3 g at 06/14/22 0930   Chlorhexidine Gluconate Cloth 2 % PADS 6 each  6 each Topical Q0600 Jose Persia, MD   6 each at 06/14/22 0403   feeding supplement (KATE FARMS STANDARD 1.4) liquid 325 mL  325 mL Oral TID BM Loletha Grayer, MD   325 mL at 06/13/22 2151   multivitamin with minerals tablet 1 tablet  1 tablet Oral Daily Jose Persia, MD       ondansetron (ZOFRAN) tablet 4 mg  4 mg Oral Q6H PRN Jose Persia, MD       Or   ondansetron (ZOFRAN) injection 4 mg  4 mg Intravenous Q6H PRN Jose Persia, MD       scopolamine (TRANSDERM-SCOP) 1 MG/3DAYS 1.5 mg  1 patch Transdermal Q72H Lorella Nimrod, MD   1.5 mg at 06/12/22 1537    sodium chloride flush (NS) 0.9 % injection 3 mL  3 mL Intravenous Q12H Jose Persia, MD   3 mL at 06/14/22 0919   thiamine (VITAMIN B1) injection 100 mg  100 mg Intravenous Daily Jose Persia, MD   100 mg at 06/14/22 0918    VITAL SIGNS: BP (!) 146/68   Pulse 95   Temp 98.5 F (36.9 C) (Oral)   Resp 16   Ht '5\' 5"'$  (1.651 m)   Wt 117 lb 15.1 oz (53.5 kg)   SpO2 96%   BMI 19.63 kg/m  Filed Weights   06/12/22 0444 06/13/22 0429 06/14/22 0500  Weight: 103 lb 9.9 oz (47 kg) 107 lb 12.9 oz (48.9 kg) 117 lb 15.1 oz (53.5 kg)    Estimated body mass index is 19.63 kg/m as calculated from the following:   Height as of this encounter: '5\' 5"'$  (1.651 m).   Weight as of this encounter: 117 lb 15.1 oz (53.5 kg).  LABS: CBC:    Component Value Date/Time   WBC 7.7 06/13/2022 0358   HGB 9.1 (L) 06/13/2022 0358   HCT 27.1 (L) 06/13/2022 0358   PLT 131 (L) 06/13/2022 0358   MCV 84.2 06/13/2022 0358   NEUTROABS 6.8 06/11/2022 0417   LYMPHSABS 0.3 (L) 06/11/2022 0417   MONOABS 0.2 06/11/2022 0417   EOSABS 0.0 06/11/2022 0417   BASOSABS 0.1 06/11/2022 0417   Comprehensive Metabolic Panel:    Component Value Date/Time   NA 147 (H) 06/13/2022 0358   K 3.6 06/13/2022 0358   CL 113 (H) 06/13/2022 0358   CO2 28 06/13/2022 0358   BUN 18 06/13/2022 0358   CREATININE 0.85 06/13/2022 0358   CREATININE 0.78 07/06/2021 1007   GLUCOSE 117 (H) 06/13/2022 0358   CALCIUM 8.5 (L) 06/13/2022 0358   AST 53 (H) 06/12/2022 0403   ALT 26 06/12/2022 0403   ALKPHOS 283 (H) 06/12/2022 0403   BILITOT 1.2 06/12/2022 0403   PROT 6.3 (L) 06/12/2022 0403   ALBUMIN 1.8 (L) 06/12/2022 0403    RADIOGRAPHIC STUDIES: CT CERVICAL SPINE WO CONTRAST  Result Date: 06/11/2022 CLINICAL DATA:  Neck trauma (Age >= 65y) EXAM: CT CERVICAL SPINE WITHOUT CONTRAST TECHNIQUE: Multidetector CT imaging of the cervical spine was performed without intravenous contrast. Multiplanar CT image reconstructions were also generated.  RADIATION DOSE REDUCTION: This exam was performed according to the departmental dose-optimization program which includes automated exposure control, adjustment of the mA and/or kV according to patient size and/or use of iterative reconstruction technique. COMPARISON:  None Available. FINDINGS: Alignment: No substantial sagittal subluxation. Skull base and vertebrae: No evidence of acute fracture. Vertebral body heights are maintained. Soft tissues and spinal canal: No prevertebral fluid or swelling. No visible canal hematoma. Disc levels: Severe multilevel degenerative change. This includes severe multilevel degenerative disease as well as multilevel facet/uncovertebral hypertrophy with varying degrees of neural foraminal stenosis. Upper chest:  Biapical pleuroparenchymal scarring. IMPRESSION: 1. No evidence of acute fracture or traumatic malalignment. 2. Severe multilevel degenerative change. Electronically Signed   By: Margaretha Sheffield M.D.   On: 06/11/2022 12:31   DG Chest Port 1 View  Result Date: 06/11/2022 CLINICAL DATA:  Cough EXAM: PORTABLE CHEST 1 VIEW COMPARISON:  CXR 06/10/22 FINDINGS: Right-sided chest port in place with tip at the cavoatrial junction. No pleural effusion. No pneumothorax. Unchanged cardiac and mediastinal contours. There are prominent bilateral interstitial opacities, which are nonspecific and may represent pulmonary venous congestion or atypical infection. There may also be a focal retrocardiac opacity. No radiographically apparent displaced rib fractures. Visualized upper abdomen is unremarkable. IMPRESSION: Prominent bilateral interstitial opacities, which are nonspecific and may represent pulmonary venous congestion or atypical infection. There also may be a retrocardiac opacity which could represent a pneumonia. Recommend two-view chest radiograph for further evaluation. Electronically Signed   By: Marin Roberts M.D.   On: 06/11/2022 11:44   US RENAL  Result Date:  06/11/2022 CLINICAL DATA:  Acute knee injury, history of chronic kidney disease, GERD EXAM: RENAL / URINARY TRACT ULTRASOUND COMPLETE COMPARISON:  CT abdomen 01/18/2022 FINDINGS: Right Kidney: Renal measurements: 11.5 x 3.8 x 6.5 cm = volume: 149 mL. Normal cortical thickness. Increased cortical echogenicity. No mass, hydronephrosis, or shadowing calcification. Trace perinephric fluid RIGHT kidney. Left Kidney: Renal measurements: 10.3 x 4.3 x 5.3 cm = volume: 123 mL. Normal cortical thickness. Increased cortical echogenicity. No mass, hydronephrosis, or shadowing calcification. Bladder: Foley catheter within urinary bladder. Bladder contains minimal urine, poorly assessed. Other: Small BILATERAL pleural effusions.  Mild ascites. IMPRESSION: Medical renal disease changes of both kidneys. No evidence of renal mass or hydronephrosis. Trace perinephric fluid RIGHT kidney. Small BILATERAL pleural effusions and mild ascites. Electronically Signed   By: Lavonia Dana M.D.   On: 06/11/2022 09:25   MR BRAIN WO CONTRAST  Result Date: 06/10/2022 CLINICAL DATA:  Initial evaluation for mental status change, unknown cause. EXAM: MRI HEAD WITHOUT CONTRAST TECHNIQUE: Multiplanar, multiecho pulse sequences of the brain and surrounding structures were obtained without intravenous contrast. COMPARISON:  None available. FINDINGS: Brain: Cerebral volume within normal limits. No significant cerebral white matter disease for age. No evidence for acute or subacute ischemia. Gray-white matter differentiation maintained. No areas of chronic cortical infarction. No acute or chronic intracranial blood products. No mass lesion, midline shift or mass effect no hydrocephalus or extra-axial fluid collection. Pituitary gland and suprasellar region within normal limits. Vascular: Major intracranial vascular flow voids are maintained. Skull and upper cervical spine: Question chronic fracture deformity versus congenital segmental defect at the dens  (series 9, image 11). Diffuse loss of normal bone marrow signal, nonspecific but can be seen with anemia, smoking, obesity, and infiltrative/myelofibrotic marrow processes. No focal marrow replacing lesion. No scalp soft tissue abnormality. Sinuses/Orbits: Globes and orbital soft tissues demonstrate no acute finding. Paranasal sinuses are largely clear. Trace bilateral mastoid effusions, of doubtful significance. Other: None. IMPRESSION: 1. Normal brain MRI for age. No acute intracranial abnormality identified. 2. Question chronic fracture deformity versus congenital segmental defect at the dens. Finding could be further assessed with dedicated CT of the cervical spine as warranted. Electronically Signed   By: Jeannine Boga M.D.   On: 06/10/2022 20:08   DG Chest Port 1 View  Result Date: 06/10/2022 CLINICAL DATA:  Cough EXAM: PORTABLE CHEST 1 VIEW COMPARISON:  CXR 05/18/21 FINDINGS: Right-sided central venous catheter with tip at the cavoatrial junction. No pleural effusion. No pneumothorax. Unchanged  cardiac and mediastinal contours. No focal airspace opacity. No radiographically apparent displaced rib fractures. Visualized upper abdomen is unremarkable. Degenerative changes of the right AC joint. IMPRESSION: No focal airspace opacity. Electronically Signed   By: Marin Roberts M.D.   On: 06/10/2022 12:09    PERFORMANCE STATUS (ECOG) : 4 - Bedbound  Review of Systems Unable to complete  Physical Exam General: Thin, frail appearing Pulmonary: Cough, unlabored Extremities: no edema, no joint deformities Skin: no rashes Neurological: Lethargic  IMPRESSION: Weekend notes reviewed.  Patient remains lethargic.  She wakes when stimulated was able to follow some commands but remains quite weak.  Oral intake is poor.  I met with patient's husband and together we called son and daughter by phone.  Son is a Pharmacist, community in [place].  Family is concerned regarding patient's poor nutrition and that she  lacks the required sustenance for clinical improvement.  They have requested a feeding tube.  Family clarifies that they are not interested in a PEG but would like a trial of a nasogastric tube for artificial nutrition.  I explained that this was unlikely to improve her overall clinical condition as her poor nutritional status is secondary to her advanced malignancy.  Feeding tubes have been studied in the context of advanced cancers and have not been associated with meaningful improvement.  Additionally, they have associated risks such as aspiration.  It is unclear if patient would tolerate NGT insertion or would leave it in place.  Furthermore, patient cannot utilize this at home where family ultimately want her to be.  Despite discussion of the many risks and limited benefits, family verbalized that they would like to exhaust all options.  I did explain to family that at this point patient is too frail to consider further chemotherapy or other cancer treatments.  I strongly recommended hospice.  We also discussed CODE STATUS.  Family verbalized understanding of the importance of making a decision regarding resuscitation at end-of-life.  However, they say that at this point they have not discussed it as a family and would like to do so prior to making any decisions.  PLAN: -Continue current scope of treatment -Family would like trial of a feeding tube -Will follow  Case and plan discussed with Dr. Reesa Chew and Dr. Grayland Ormond  Time Total: 60 minutes  Visit consisted of counseling and education dealing with the complex and emotionally intense issues of symptom management and palliative care in the setting of serious and potentially life-threatening illness.Greater than 50%  of this time was spent counseling and coordinating care related to the above assessment and plan.  Signed by: Altha Harm, PhD, NP-C

## 2022-06-14 NOTE — Progress Notes (Signed)
Mobility Specialist - Progress Note   06/14/22 1156  Mobility  Activity Ambulated with assistance in room;Transferred from chair to bed  Level of Assistance Minimal assist, patient does 75% or more  Assistive Device Front wheel walker  Distance Ambulated (ft) 2 ft  Activity Response Tolerated well  $Mobility charge 1 Mobility   Pt sitting in recliner upon entry, utilizing RA. Pt STS to RW MinA to bring trunk upright and stand from recliner. Pt amb to the bed SBA to this date, tolerated well. Pt required min VC for sequencing, and required no manual movement of RW.  Pt left supine with alarm set and needs within reach, family member present at bedside.   [place]ie [place] Mobility Specialist 06/14/22 12:05 PM

## 2022-06-15 DIAGNOSIS — W1830XA Fall on same level, unspecified, initial encounter: Secondary | ICD-10-CM | POA: Diagnosis not present

## 2022-06-15 DIAGNOSIS — R531 Weakness: Secondary | ICD-10-CM | POA: Diagnosis not present

## 2022-06-15 DIAGNOSIS — N179 Acute kidney failure, unspecified: Secondary | ICD-10-CM | POA: Diagnosis not present

## 2022-06-15 DIAGNOSIS — C250 Malignant neoplasm of head of pancreas: Secondary | ICD-10-CM | POA: Diagnosis not present

## 2022-06-15 MED ORDER — BOOST / RESOURCE BREEZE PO LIQD CUSTOM
1.0000 | Freq: Three times a day (TID) | ORAL | Status: DC
  Administered 2022-06-15 – 2022-06-16 (×4): 1 via ORAL

## 2022-06-15 NOTE — Progress Notes (Signed)
OT Cancellation Note  Patient Details Name: Meredith Wilcox MRN: [id number] DOB: 02/08/1941   Cancelled Treatment:    Reason Eval/Treat Not Completed: Fatigue/lethargy limiting ability to participate. Upon arrival pt sleeping, awakes to voice and politely declines activity. Left with family at bed side. Will continue to follow POC.  Dessie Coma, M.S. OTR/L  06/15/22, 11:35 AM  ascom [phone]

## 2022-06-15 NOTE — TOC CM/SW Note (Signed)
    Durable Medical Equipment  (From admission, onward)           Start     Ordered   06/15/22 1431  For home use only DME 3 n 1  Once        06/15/22 1430   06/15/22 1430  For home use only DME Hospital bed  Once       Question Answer Comment  Length of Need Lifetime   Patient has (list medical condition): Stage IV pancreatic cancer   The above medical condition requires: Patient requires the ability to reposition frequently   Head must be elevated greater than: 30 degrees   Bed type Semi-electric   Trapeze Bar Yes   Support Surface: Gel Overlay      06/15/22 1430   06/15/22 1429  For home use only DME lightweight manual wheelchair with seat cushion  Once       Comments: Patient suffers from generalized weakness which impairs their ability to perform daily activities like bathing, dressing, grooming, and toileting in the home.  A walker will not resolve  issue with performing activities of daily living. A wheelchair will allow patient to safely perform daily activities. Patient is not able to propel themselves in the home using a standard weight wheelchair due to general weakness. Patient can self propel in the lightweight wheelchair. Length of need Lifetime. Accessories: elevating leg rests (ELRs), wheel locks, extensions and anti-tippers.   06/15/22 1430   06/14/22 0932  For home use only DME Bedside commode  Once       Question:  Patient needs a bedside commode to treat with the following condition  Answer:  Weakness   06/14/22 0931   06/14/22 0931  For home use only DME Walker rolling  Once       Question Answer Comment  Walker: With [place]   Patient needs a walker to treat with the following condition Weakness      06/14/22 0931

## 2022-06-15 NOTE — Progress Notes (Signed)
Progress Note   Patient: Meredith Wilcox [id number] DOB: 05/14/1940 DOA: 06/10/2022     4 DOS: the patient was seen and examined on 06/15/2022   Brief hospital course: 82 y.o. 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   Meredith Wilcox states that 2-3 days ago, she was walking in her living room to plug in a radio. When she bent over to pick up the cable, she felt herself falling down onto the ground.  She does not believe that she hit her head or lost consciousness.  Prior to the fall, she denies any dizziness, chest pain, shortness of breath or palpitations.  After the fall, she notes that she was so weak she was unable to stand up on her own.  She states generalized weakness has been ongoing and is worse after chemotherapy, which her last infusion was on 06/03/2022. She laid on the ground for at least 2 hours.  She cannot recall how she was able to stand up or if someone helped her stand up.  Her husband at bedside states he was not the one to help her up and he does not know how she got up off the ground.  Since that time, she has been experiencing increasing generalized weakness and decreased appetite.  She has not eaten in at least 2 days and has had very little water.  Due to this, she decided to come to the ER today.  At this time, Meredith Wilcox denies any fever, chills, nausea, vomiting, diarrhea, abdominal pain, chest pain, shortness of breath or palpitations.  She notes that she has had a cough since starting chemotherapy but her sputum states seems thicker and more difficult to swallow in the last few days.   Per chart review, patient's daughter found her mother on the ground on the evening of 2/28.  3/1.  Patient not eating.  I will continue IV fluids.  Awaiting palliative care consultation.  Repeat chest x-ray to see if pneumonia.  3/2: Mild hypothermia this morning,  maximum temperature recorded was 101 over the past 24 hours.  Repeat chest x-ray with bilateral interstitial opacities, which may represent pulmonary venous congestion or atypical infection.  There was also noted to have a retrocardiac opacity which can be pneumonia.  CT cervical spine was negative for any acute abnormality.  Hemoglobin decreased to 6.8, BMP with mild hyponatremia at 146, potassium low at 3.2, AKI resolved.  Continuing IV fluid. Palliative care with current scope of care, family to decide about CODE STATUS. Had another discussion and they will continue to keep current level of care, 1 unit of PRBC ordered.  Family was also inquiring about tube feeding.  3/3: Vital stable with hemoglobin improvement to 9.1, slight worsening of hypernatremia at 147 with water deficit of 1.1 L.  Giving some D5.  Potassium normalized at 3.6. P.o. intake remained poor.  Patient does not want feeding tube.  We will remove Foley catheter today to give her a voiding trial. Family would like to hear from Dr. Grayland Ormond regarding further management and prognosis.  3/4: Patient remained lethargic with poor p.o. intake.  Stating that she does not have much appetite.  Palliative care met with family but they have not decided about the CODE STATUS and would like to give her maximum chances.  They were insisting on placing NG tube for artificial feeding.  Discussed with Dr. Grayland Ormond from oncology and she will  not be a good candidate as it will not change the disease trajectory.  Patient will be high risk for aspiration due to worsening weakness and lethargy, at times she is unable to clear her own secretions.  Nausea and vomiting are very common features of advance pancreatic malignancy.  Dr. Maryjane Hurter will see her later today.  Patient is approaching end-of-life and appropriate for hospice and comfort care only.  3/5; patient remained lethargic and weak, slightly improved p.o. intake this morning.  Discussed with patient  and she does not want NG tube.  She would like to remain full code. Tried explaining her situation when a friend from church community step in and was getting upset that we are taking her hope away.  Patient is not a candidate for continuation of any cancer treatment at this time.  Very poor prognosis.  Maximum home health services and equipments to help at home ordered as daughter would like to take her back home.  Patient is very high risk for deterioration and mortality.  Assessment and Plan: * AKI (acute kidney injury) ([place]) Resolved with IV fluid. Creatinine 1.61 on presentation  - Continue IV fluids with the patient not eating.  Ground-level fall MRI did not show any acute stroke.  Question of chronic fracture deformity versus congenital segmental defect at the dens.  CT cervical spine ordered to further clarify.  PT and OT recommending home health  Malignant tumor of head of pancreas [facility]) Patient has a history of stage IV adenocarcinoma of the pancreas currently undergoing chemotherapy with Gemcitabine/Abraxane.  Case discussed with oncology and palliative care.  Patient still listed as a full code.  Overall prognosis poor especially if she does not eat.  Generalized weakness Patient with profound weakness that is equal throughout, likely secondary to progressive stage IV cancer undergoing chemotherapy.  PT and OT consultation  Thrombocytopenia ([place]) Likely secondary to chemotherapy.  Platelet count up at 132.  Hypokalemia Stable and persistent hypokalemia with potassium at 3.2.  Magnesium within normal limit. -Replace potassium and monitor   Anemia of chronic disease Hemoglobin decreased to 6.8 this morning.  Chemotherapy can be contributory. Patient has anemia of chronic disease due to advanced malignancy. -Ordered 1 unit of PRBC -Monitor hemoglobin  Protein-calorie malnutrition, severe With failure to thrive.  Palliative care was consulted at patient's family would like  to continue current level of care.  They were also inquiring about feeding tube. I discussed today with patient and she does not want any feeding tube      Subjective: Patient continued to feel weak.  Somewhat improved p.o. intake this morning.  Denies any pain, nausea or vomiting.  She does not want NG tube placed.  Physical Exam: Vitals:   06/15/22 0409 06/15/22 0704 06/15/22 0751 06/15/22 1521  BP: (!) 143/62  (!) 142/71 (!) 148/61  Pulse: 91  91 96  Resp: '15  16 18  '$ Temp: 98.6 F (37 C)  98.4 F (36.9 C) 98.6 F (37 C)  TempSrc: Oral  Oral Oral  SpO2: 95%  97% 93%  Weight:  53 kg    Height:       General.  Frail and cachectic elderly lady, in no acute distress. Pulmonary.  Lungs clear bilaterally, normal respiratory effort. CV.  Regular rate and rhythm, no JVD, rub or murmur. Abdomen.  Soft, nontender, nondistended, BS positive. CNS.  Alert and oriented .  No focal neurologic deficit. Extremities.  No edema, no cyanosis, pulses intact and symmetrical. Psychiatry.  Judgment  and insight appears normal.  .     Data Reviewed: Prior data reviewed  Family Communication: Discussed with daughter on phone.  Disposition: Status is: Inpatient  Planned Discharge Destination: Home with home health  Time spent: 39 minutes Spoke with oncology palliative care and dietitian.  Overall prognosis poor.  This record has been created using Systems analyst. Errors have been sought and corrected,but may not always be located. Such creation errors do not reflect on the standard of care.   Author: Lorella Nimrod, MD 06/15/2022 3:38 PM  For on call review [email or web address].

## 2022-06-15 NOTE — TOC Progression Note (Addendum)
Transition of Care [facility]) - Progression Note    Patient Details  Name: Meredith Wilcox MRN: [id number] Date of Birth: February 11, 1941  Transition of Care [facility]) CM/SW [place], LCSW Phone Number: 06/15/2022, 12:30 PM  Clinical Narrative: Left voicemail for daughter to see which home health agency they prefer.  3:30 pm: Set up with [facility] for PT, OT, RN, aide, SW. Daughter will come talk to patient to see if she wants to return home or to daughter's house ([address], [place], [place] [id number]). Ordered DME through [employer]. Family will transport her home.    Barriers to Discharge: Continued Medical Work up  Expected Discharge Plan and Services                                               Social Determinants of Health (SDOH) Interventions SDOH Screenings   Food Insecurity: No Food Insecurity (06/10/2022)  Housing: Low Risk  (06/10/2022)  Transportation Needs: No Transportation Needs (06/10/2022)  Utilities: Not At Risk (06/10/2022)  Alcohol Screen: Low Risk  (11/08/2019)  Depression (PHQ2-9): Low Risk  (03/15/2022)  Financial Resource Strain: Low Risk  (12/31/2021)  Physical Activity: Sufficiently Active (12/31/2021)  Social Connections: Socially Integrated (12/31/2021)  Stress: No Stress Concern Present (12/31/2021)  Tobacco Use: Low Risk  (06/10/2022)    Readmission Risk Interventions     No data to display

## 2022-06-15 NOTE — Progress Notes (Signed)
Nutrition Follow-up  DOCUMENTATION CODES:   Underweight, Severe malnutrition in context of chronic illness  INTERVENTION:   Boost Breeze po TID, each supplement provides 250 kcal and 9 grams of protein  Magic cup TID with meals, each supplement provides 290 kcal and 9 grams of protein  MVI po daily   Pt at high refeed risk; recommend monitor potassium, magnesium and phosphorus labs daily until stable  NUTRITION DIAGNOSIS:   Severe Malnutrition related to chronic illness (stage IV pancreatic cancer) as evidenced by severe fat depletion, severe muscle depletion, percent weight loss. -ongoing   GOAL:   Patient will meet greater than or equal to 90% of their needs -not met   MONITOR:   PO intake, Supplement acceptance, Labs, Weight trends, Skin, I & O's  ASSESSMENT:   82 y/o female with h/o H. Pylori, CNVM, GERD, CKD, HLD and Stage IV pancreatic cancer on chemotherapy who is admitted with weakness and falls.  Pt continues to have poor appetite and oral intake in hospital. Pt eating sips/bites to 10% of meals. Pt is refusing the [employer] supplements. RD will try Magic Cups and [employer]. Family was previously requesting temporary NGT and nutrition support to see if patient would show any improvement. Per MD, NGT will not be placed and pt to discharge home tomorrow. Pt is nearing end of life and is not a candidate for G-tube. Pt remains at high refeed risk. Per chart, pt is up ~10lb since admission; pt +5.2L on her I & Os.    Medications reviewed and include: MVI, scopolamine, thiamine, unasyn   Labs reviewed: Na 147(H), K 3.6 wnl- 3/3 P 2.5 wnl, Mg 2.1 wnl- 3/2 Hgb 9.1(L), Hct 27.1(L)  Diet Order:   Diet Order             DIET DYS 3 Room service appropriate? Yes with Assist; Fluid consistency: Thin  Diet effective now                  EDUCATION NEEDS:   No education needs have been identified at this time  Skin:  Skin Assessment: Reviewed RN  Assessment  Last BM:  3/5- type 7  Height:   Ht Readings from Last 1 Encounters:  06/10/22 '5\' 5"'$  (1.651 m)    Weight:   Wt Readings from Last 1 Encounters:  06/15/22 53 kg    Ideal Body Weight:  56.8 kg  BMI:  Body mass index is 19.44 kg/m.  Estimated Nutritional Needs:   Kcal:  1700-1900kcal/day  Protein:  85-95g/day  Fluid:  1.4-1.6L/day  Koleen Distance MS, RD, LDN Please refer to [facility] for RD and/or RD on-call/weekend/after hours pager

## 2022-06-15 NOTE — Progress Notes (Signed)
PT Cancellation Note  Patient Details Name: Meredith Wilcox MRN: [id number] DOB: Oct 20, 1940   Cancelled Treatment:    Reason Eval/Treat Not Completed: Other (comment). Chart reviewed. Pt has been recommended for comfort care. Secure chat to MD and will discontinue current PT orders. Please resume if needs change.   Samantha Ragen 06/15/2022, 2:02 PM Greggory Stallion, PT, DPT, GCS [phone]

## 2022-06-16 ENCOUNTER — Telehealth: Payer: Self-pay | Admitting: Family Medicine

## 2022-06-16 ENCOUNTER — Other Ambulatory Visit: Payer: Self-pay | Admitting: Hospice and Palliative Medicine

## 2022-06-16 DIAGNOSIS — R531 Weakness: Secondary | ICD-10-CM | POA: Diagnosis not present

## 2022-06-16 DIAGNOSIS — I10 Essential (primary) hypertension: Secondary | ICD-10-CM | POA: Diagnosis not present

## 2022-06-16 DIAGNOSIS — N179 Acute kidney failure, unspecified: Secondary | ICD-10-CM | POA: Diagnosis not present

## 2022-06-16 DIAGNOSIS — C250 Malignant neoplasm of head of pancreas: Secondary | ICD-10-CM | POA: Diagnosis not present

## 2022-06-16 DIAGNOSIS — Z515 Encounter for palliative care: Secondary | ICD-10-CM | POA: Diagnosis not present

## 2022-06-16 MED ORDER — AMLODIPINE BESYLATE 2.5 MG PO TABS: 2.5000 mg | ORAL_TABLET | Freq: Every evening | ORAL | 0 refills | Status: DC

## 2022-06-16 MED ORDER — ONDANSETRON HCL 4 MG PO TABS: 4.0000 mg | ORAL_TABLET | Freq: Four times a day (QID) | ORAL | 0 refills | Status: DC | PRN

## 2022-06-16 NOTE — Progress Notes (Signed)
[place] at [facility] Telephone:([phone] [phone]) [phone]   Name: Meredith Wilcox Date: 06/16/2022 MRN: [id number]  DOB: January 25, 1941  Patient Care Team: [name], MD as PCP - General (Family Medicine) Birder Robson, MD as Consulting Physician (Ophthalmology) Clent Jacks, RN as Oncology Nurse Navigator Grayland Ormond, Kathlene November, MD as Consulting Physician (Oncology)    REASON FOR CONSULTATION: Meredith Wilcox is a 82 y.o. 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  Patient was admitted to hospital on 06/10/2022 after having a fall at home.  She is on treatment for possible pneumonia.  Palliative care was consulted to address goals. .   CODE STATUS: Full code  PAST MEDICAL HISTORY: Past Medical History:  Diagnosis Date   Anemia    H/O DURING PREGNANCY   Arthritis    Chronic kidney disease    KIDNEY PROBLEMS AROUND AGE 63   Geographic tongue    GERD (gastroesophageal reflux disease)    H/O NO MEDS   Helicobacter pylori gastrointestinal tract infection    Hyperlipidemia    Incontinence    Indigestion    Prolapse of vaginal walls    Right sided sciatica    Tachycardia    Urethral prolapse     PAST SURGICAL HISTORY:  Past Surgical History:  Procedure Laterality Date   BREAST BIOPSY Left 10/11/2016   left breast stereo/ VASCULAR LESION   BREAST BIOPSY Left 12/02/2016   Procedure: NEEDLE LOCALIZATION;  Surgeon: [name], MD;  Location: [facility] ORS;  Service: General;  Laterality: Left;   BREAST EXCISIONAL BIOPSY Left 12/02/2016   left lumpectomy   BREAST LUMPECTOMY Left 12/02/2016   Procedure: EXCISION BREAST MASS;  Surgeon: [name], MD;  Location: [facility] ORS;  Service: General;  Laterality: Left;   DILATION AND CURETTAGE OF UTERUS     dnc     1983   ERCP N/A 07/07/2021   Procedure: ENDOSCOPIC RETROGRADE  CHOLANGIOPANCREATOGRAPHY (ERCP);  Surgeon: [name], MD;  Location: [facility] ENDOSCOPY;  Service: Endoscopy;  Laterality: N/A;   ERCP N/A 10/01/2021   Procedure: ENDOSCOPIC RETROGRADE CHOLANGIOPANCREATOGRAPHY (ERCP);  Surgeon: [name], MD;  Location: [facility] ENDOSCOPY;  Service: Endoscopy;  Laterality: N/A;   fatty tumor removal Right    hand   IR IMAGING GUIDED PORT INSERTION  04/06/2022    HEMATOLOGY/ONCOLOGY HISTORY:  Oncology History  Malignant tumor of head of pancreas ([place])  03/15/2022 Initial Diagnosis   Malignant tumor of head of pancreas ([place])   03/26/2022 Cancer Staging   Staging form: Exocrine Pancreas, AJCC 8th Edition - Clinical stage from 03/26/2022: Stage IV (cT2, cN0, cM1) - Signed by Lloyd Huger, MD on 03/26/2022 Total positive nodes: 0   04/08/2022 -  Chemotherapy   Patient is on Treatment Plan : PANCREATIC Abraxane D1,8,15 + Gemcitabine D1,8,15 q28d       ALLERGIES:  is allergic to tussionex pennkinetic er [hydrocod poli-chlorphe poli er] and hydrocodone.  MEDICATIONS:  Current Facility-Administered Medications  Medication Dose Route Frequency Provider Last Rate Last Admin   Chlorhexidine Gluconate Cloth 2 % PADS 6 each  6 each Topical Q0600 Jose Persia, MD   6 each at 06/14/22 0403   feeding supplement (BOOST / RESOURCE BREEZE) liquid 1 Container  1 Container Oral TID BM Lorella Nimrod, MD   1 Container at 06/16/22 0939   multivitamin with minerals tablet 1 tablet  1 tablet Oral Daily Jose Persia, MD  1 tablet at 06/16/22 0939   ondansetron (ZOFRAN) tablet 4 mg  4 mg Oral Q6H PRN Jose Persia, MD       Or   ondansetron (ZOFRAN) injection 4 mg  4 mg Intravenous Q6H PRN Jose Persia, MD       scopolamine (TRANSDERM-SCOP) 1 MG/3DAYS 1.5 mg  1 patch Transdermal Q72H Lorella Nimrod, MD   1.5 mg at 06/15/22 1643   sodium chloride flush (NS) 0.9 % injection 3 mL  3 mL Intravenous Q12H Jose Persia, MD   3 mL at 06/16/22 0939   thiamine  (VITAMIN B1) injection 100 mg  100 mg Intravenous Daily Jose Persia, MD   100 mg at 06/16/22 0937    VITAL SIGNS: BP (!) 151/61   Pulse 87   Temp 98.4 F (36.9 C) (Oral)   Resp 18   Ht '5\' 5"'$  (1.651 m)   Wt 119 lb 0.8 oz (54 kg)   SpO2 97%   BMI 19.81 kg/m  Filed Weights   06/14/22 0500 06/15/22 0704 06/16/22 0454  Weight: 117 lb 15.1 oz (53.5 kg) 116 lb 13.5 oz (53 kg) 119 lb 0.8 oz (54 kg)    Estimated body mass index is 19.81 kg/m as calculated from the following:   Height as of this encounter: '5\' 5"'$  (1.651 m).   Weight as of this encounter: 119 lb 0.8 oz (54 kg).  LABS: CBC:    Component Value Date/Time   WBC 7.7 06/13/2022 0358   HGB 9.1 (L) 06/13/2022 0358   HCT 27.1 (L) 06/13/2022 0358   PLT 131 (L) 06/13/2022 0358   MCV 84.2 06/13/2022 0358   NEUTROABS 6.8 06/11/2022 0417   LYMPHSABS 0.3 (L) 06/11/2022 0417   MONOABS 0.2 06/11/2022 0417   EOSABS 0.0 06/11/2022 0417   BASOSABS 0.1 06/11/2022 0417   Comprehensive Metabolic Panel:    Component Value Date/Time   NA 147 (H) 06/13/2022 0358   K 3.6 06/13/2022 0358   CL 113 (H) 06/13/2022 0358   CO2 28 06/13/2022 0358   BUN 18 06/13/2022 0358   CREATININE 0.85 06/13/2022 0358   CREATININE 0.78 07/06/2021 1007   GLUCOSE 117 (H) 06/13/2022 0358   CALCIUM 8.5 (L) 06/13/2022 0358   AST 53 (H) 06/12/2022 0403   ALT 26 06/12/2022 0403   ALKPHOS 283 (H) 06/12/2022 0403   BILITOT 1.2 06/12/2022 0403   PROT 6.3 (L) 06/12/2022 0403   ALBUMIN 1.8 (L) 06/12/2022 0403    RADIOGRAPHIC STUDIES: CT CERVICAL SPINE WO CONTRAST  Result Date: 06/11/2022 CLINICAL DATA:  Neck trauma (Age >= 65y) EXAM: CT CERVICAL SPINE WITHOUT CONTRAST TECHNIQUE: Multidetector CT imaging of the cervical spine was performed without intravenous contrast. Multiplanar CT image reconstructions were also generated. RADIATION DOSE REDUCTION: This exam was performed according to the departmental dose-optimization program which includes automated  exposure control, adjustment of the mA and/or kV according to patient size and/or use of iterative reconstruction technique. COMPARISON:  None Available. FINDINGS: Alignment: No substantial sagittal subluxation. Skull base and vertebrae: No evidence of acute fracture. Vertebral body heights are maintained. Soft tissues and spinal canal: No prevertebral fluid or swelling. No visible canal hematoma. Disc levels: Severe multilevel degenerative change. This includes severe multilevel degenerative disease as well as multilevel facet/uncovertebral hypertrophy with varying degrees of neural foraminal stenosis. Upper chest: Biapical pleuroparenchymal scarring. IMPRESSION: 1. No evidence of acute fracture or traumatic malalignment. 2. Severe multilevel degenerative change. Electronically Signed   By: Margaretha Sheffield M.D.   On:  06/11/2022 12:31   DG Chest Port 1 View  Result Date: 06/11/2022 CLINICAL DATA:  Cough EXAM: PORTABLE CHEST 1 VIEW COMPARISON:  CXR 06/10/22 FINDINGS: Right-sided chest port in place with tip at the cavoatrial junction. No pleural effusion. No pneumothorax. Unchanged cardiac and mediastinal contours. There are prominent bilateral interstitial opacities, which are nonspecific and may represent pulmonary venous congestion or atypical infection. There may also be a focal retrocardiac opacity. No radiographically apparent displaced rib fractures. Visualized upper abdomen is unremarkable. IMPRESSION: Prominent bilateral interstitial opacities, which are nonspecific and may represent pulmonary venous congestion or atypical infection. There also may be a retrocardiac opacity which could represent a pneumonia. Recommend two-view chest radiograph for further evaluation. Electronically Signed   By: Marin Roberts M.D.   On: 06/11/2022 11:44   US RENAL  Result Date: 06/11/2022 CLINICAL DATA:  Acute knee injury, history of chronic kidney disease, GERD EXAM: RENAL / URINARY TRACT ULTRASOUND COMPLETE  COMPARISON:  CT abdomen 01/18/2022 FINDINGS: Right Kidney: Renal measurements: 11.5 x 3.8 x 6.5 cm = volume: 149 mL. Normal cortical thickness. Increased cortical echogenicity. No mass, hydronephrosis, or shadowing calcification. Trace perinephric fluid RIGHT kidney. Left Kidney: Renal measurements: 10.3 x 4.3 x 5.3 cm = volume: 123 mL. Normal cortical thickness. Increased cortical echogenicity. No mass, hydronephrosis, or shadowing calcification. Bladder: Foley catheter within urinary bladder. Bladder contains minimal urine, poorly assessed. Other: Small BILATERAL pleural effusions.  Mild ascites. IMPRESSION: Medical renal disease changes of both kidneys. No evidence of renal mass or hydronephrosis. Trace perinephric fluid RIGHT kidney. Small BILATERAL pleural effusions and mild ascites. Electronically Signed   By: Lavonia Dana M.D.   On: 06/11/2022 09:25   MR BRAIN WO CONTRAST  Result Date: 06/10/2022 CLINICAL DATA:  Initial evaluation for mental status change, unknown cause. EXAM: MRI HEAD WITHOUT CONTRAST TECHNIQUE: Multiplanar, multiecho pulse sequences of the brain and surrounding structures were obtained without intravenous contrast. COMPARISON:  None available. FINDINGS: Brain: Cerebral volume within normal limits. No significant cerebral white matter disease for age. No evidence for acute or subacute ischemia. Gray-white matter differentiation maintained. No areas of chronic cortical infarction. No acute or chronic intracranial blood products. No mass lesion, midline shift or mass effect no hydrocephalus or extra-axial fluid collection. Pituitary gland and suprasellar region within normal limits. Vascular: Major intracranial vascular flow voids are maintained. Skull and upper cervical spine: Question chronic fracture deformity versus congenital segmental defect at the dens (series 9, image 11). Diffuse loss of normal bone marrow signal, nonspecific but can be seen with anemia, smoking, obesity, and  infiltrative/myelofibrotic marrow processes. No focal marrow replacing lesion. No scalp soft tissue abnormality. Sinuses/Orbits: Globes and orbital soft tissues demonstrate no acute finding. Paranasal sinuses are largely clear. Trace bilateral mastoid effusions, of doubtful significance. Other: None. IMPRESSION: 1. Normal brain MRI for age. No acute intracranial abnormality identified. 2. Question chronic fracture deformity versus congenital segmental defect at the dens. Finding could be further assessed with dedicated CT of the cervical spine as warranted. Electronically Signed   By: Jeannine Boga M.D.   On: 06/10/2022 20:08   DG Chest Port 1 View  Result Date: 06/10/2022 CLINICAL DATA:  Cough EXAM: PORTABLE CHEST 1 VIEW COMPARISON:  CXR 05/18/21 FINDINGS: Right-sided central venous catheter with tip at the cavoatrial junction. No pleural effusion. No pneumothorax. Unchanged cardiac and mediastinal contours. No focal airspace opacity. No radiographically apparent displaced rib fractures. Visualized upper abdomen is unremarkable. Degenerative changes of the right AC joint. IMPRESSION: No focal airspace  opacity. Electronically Signed   By: Marin Roberts M.D.   On: 06/10/2022 12:09    PERFORMANCE STATUS (ECOG) : 4 - Bedbound  Review of Systems Unable to complete  Physical Exam General: Thin, frail appearing Pulmonary: Cough, unlabored Extremities: no edema, no joint deformities Skin: no rashes Neurological: weakness  IMPRESSION: Follow-up visit.  Patient much more alert today and able to readily engage in a conversation regarding goals.  I met with patient, husband, and daughter.  Together, we reviewed the course of her hospitalization.  Patient recognizes that she is quite frail and weak.  At this point, performance status is too poor for her to undergo further chemotherapy.  Patient says that she is accepting of what ever lies ahead.  She has a strong faith in God and says that she is not  afraid when it is her end-of-life.  However, family are trying to remain optimistic that patient will improve.  We discussed options for her returning home with either home health or hospice.  Patient would like to pursue home physical therapy.  We discussed future transition to hospice if needed.  In the interim, we will consult community palliative care to also follow-up at home.  Discussed CODE STATUS.  Patient stated clearly and repeatedly that she would not be interested in resuscitation or life prolonging measures such as intubation.  She was in agreement with DNR/DNI.  Husband also verbalized agreement with this decision.  I signed a DNR order and place it on her chart  PLAN: -Best supportive care -DNR/DNI -Dispo: Home health with palliative care following  Case and plan discussed with Dr. Reesa Chew and Dr. Grayland Ormond  Time Total: 45 minutes  Visit consisted of counseling and education dealing with the complex and emotionally intense issues of symptom management and palliative care in the setting of serious and potentially life-threatening illness.Greater than 50%  of this time was spent counseling and coordinating care related to the above assessment and plan.  Signed by: Altha Harm, PhD, NP-C

## 2022-06-16 NOTE — TOC Progression Note (Addendum)
Transition of Care [facility]) - Progression Note    Patient Details  Name: Meredith Wilcox MRN: [id number] Date of Birth: 05/21/1940  Transition of Care [facility]) CM/SW Contact  Candie Chroman, LCSW Phone Number: 06/16/2022, 10:32 AM  Clinical Narrative:  Received call from daughter this morning stating that patient does not want home health or DME. Patient told her she will set up her own home health. CSW met with patient and confirmed information. She stated there is a PCS agency that she can set herself up with but she wants to wait to see how she does at home first. CSW encouraged patient to contact her PCP if she changes her mind about the home health. Patient expressed understanding.   11:03 am: Called daughter to update and provided phone number for her to call [employer] in case they decide they do want the DME. Orders are good for 30 days.  11:48 am: Patient did not want home health because she was concerned about cost. CSW met with her and explained this will be covered by her Medicare. She is agreeable to home health again. [employer] is checking copay cost and will call daughter with information.    Barriers to Discharge: Continued Medical Work up  Expected Discharge Plan and Services                                               Social Determinants of Health (SDOH) Interventions SDOH Screenings   Food Insecurity: No Food Insecurity (06/10/2022)  Housing: Low Risk  (06/10/2022)  Transportation Needs: No Transportation Needs (06/10/2022)  Utilities: Not At Risk (06/10/2022)  Alcohol Screen: Low Risk  (11/08/2019)  Depression (PHQ2-9): Low Risk  (03/15/2022)  Financial Resource Strain: Low Risk  (12/31/2021)  Physical Activity: Sufficiently Active (12/31/2021)  Social Connections: Socially Integrated (12/31/2021)  Stress: No Stress Concern Present (12/31/2021)  Tobacco Use: Low Risk  (06/10/2022)    Readmission Risk Interventions     No data to display

## 2022-06-16 NOTE — Telephone Encounter (Signed)
Copied from [place] [id number]. Topic: General - Other >> Jun 16, 2022  1:49 PM [place] B wrote: Reason for CRM: Patients husband called in to states received call for pt to mak an appt, but pt can't make an appt , because she is in the hospital.

## 2022-06-16 NOTE — Discharge Instructions (Signed)
We gave you a prescription of amlodipine which you were taking before for high blood pressure as your blood pressure remained little elevated.

## 2022-06-16 NOTE — Progress Notes (Signed)
Mobility Specialist - Progress Note   06/16/22 1152  Mobility  Activity Transferred from bed to chair;Ambulated with assistance in room  Level of Assistance Standby assist, set-up cues, supervision of patient - no hands on  Assistive Device Front wheel walker  Distance Ambulated (ft) 4 ft  Activity Response Tolerated well  $Mobility charge 1 Mobility   Pt supine upon entry, utilizing RA. Pt requesting to sit in recliner due to back pain while laying. Pt completed bed mob MinA for trunk support, able to self support trunk once sitting EOB. Pt STS to RW MinA, and amb to the recliner SBA. Pt required min Vcs for sequencing, no manual movement of RW needed for transfer. Pt left sitting in recliner with alarm set and needs within reach. Family member present at bedside.   Candie Mile Mobility Specialist 06/16/22 11:55 AM

## 2022-06-16 NOTE — Progress Notes (Signed)
[place]  [phone] [phone]  ID: Meredith Wilcox OB: [birth year]  MR#: [id number]  [id number]  Patient Care Team: [name], MD as PCP - General (Family Medicine) Birder Robson, MD as Consulting Physician (Ophthalmology) Clent Jacks, RN as Oncology Nurse Navigator Grayland Ormond, Kathlene November, MD as Consulting Physician (Oncology)  CHIEF COMPLAINT: Stage IV pancreatic cancer, declining performance status.  INTERVAL HISTORY: Patient remains lethargic, but easily awoken.  She continues to have significant weakness and fatigue and a poor appetite.  She reiterated she does not want an NG tube she also seemed to express understanding that no further chemotherapy is planned.  No family at bedside.  REVIEW OF SYSTEMS:   Review of Systems  Constitutional:  Positive for malaise/fatigue. Negative for fever and weight loss.  Respiratory: Negative.  Negative for cough, hemoptysis and shortness of breath.   Cardiovascular: Negative.  Negative for chest pain and leg swelling.  Gastrointestinal: Negative.  Negative for abdominal pain.  Genitourinary:  Positive for dysuria.  Musculoskeletal: Negative.  Negative for back pain.  Skin: Negative.  Negative for rash.  Neurological:  Positive for weakness. Negative for dizziness, focal weakness and headaches.  Psychiatric/Behavioral: Negative.  The patient is not nervous/anxious.     As per HPI. Otherwise, a complete review of systems is negative.  PAST MEDICAL HISTORY: Past Medical History:  Diagnosis Date   Anemia    H/O DURING PREGNANCY   Arthritis    Chronic kidney disease    KIDNEY PROBLEMS AROUND AGE 74   Geographic tongue    GERD (gastroesophageal reflux disease)    H/O NO MEDS   Helicobacter pylori gastrointestinal tract infection    Hyperlipidemia    Incontinence    Indigestion    Prolapse of vaginal walls    Right sided sciatica    Tachycardia    Urethral prolapse     PAST  SURGICAL HISTORY: Past Surgical History:  Procedure Laterality Date   BREAST BIOPSY Left 10/11/2016   left breast stereo/ VASCULAR LESION   BREAST BIOPSY Left 12/02/2016   Procedure: NEEDLE LOCALIZATION;  Surgeon: [name], MD;  Location: [facility] ORS;  Service: General;  Laterality: Left;   BREAST EXCISIONAL BIOPSY Left 12/02/2016   left lumpectomy   BREAST LUMPECTOMY Left 12/02/2016   Procedure: EXCISION BREAST MASS;  Surgeon: [name], MD;  Location: [facility] ORS;  Service: General;  Laterality: Left;   DILATION AND CURETTAGE OF UTERUS     dnc     1983   ERCP N/A 07/07/2021   Procedure: ENDOSCOPIC RETROGRADE CHOLANGIOPANCREATOGRAPHY (ERCP);  Surgeon: [name], MD;  Location[facility];  Service: Endoscopy;  Laterality: N/A;   ERCP N/A 10/01/2021   Procedure: ENDOSCOPIC RETROGRADE CHOLANGIOPANCREATOGRAPHY (ERCP);  Surgeon: [name], MD;  Location: [facility] ENDOSCOPY;  Service: Endoscopy;  Laterality: N/A;   fatty tumor removal Right    hand   IR IMAGING GUIDED PORT INSERTION  04/06/2022    FAMILY HISTORY: Family History  Problem Relation Age of Onset   Diabetes Mother    Hypertension Mother    Cardiomyopathy Sister    Down syndrome Sister    Leukemia Brother    Alcohol abuse Son        youngest son   Asthma Son        youngest son   Rheum arthritis Sister    Drug abuse Child 69   Breast cancer Neg Hx    Kidney cancer Neg Hx    Bladder Cancer  Neg Hx    Prostate cancer Neg Hx     ADVANCED DIRECTIVES (Y/N):  '@ADVDIR'$ @  HEALTH MAINTENANCE: Social History   Tobacco Use   Smoking status: Never   Smokeless tobacco: Never   Tobacco comments:    smoking cessation materials not required  Vaping Use   Vaping Use: Never used  Substance Use Topics   Alcohol use: No    Alcohol/week: 0.0 standard drinks of alcohol   Drug use: No     Colonoscopy:  PAP:  Bone density:  Lipid panel:  Allergies  Allergen Reactions   Tussionex Pennkinetic Er  [Hydrocod Poli-Chlorphe Poli Er] Other (See Comments)    Real dizzy headed even after a while still walking every direction but straight, patient stated she stopped taking it because of that.   Hydrocodone     Light headed and dizziness    Current Facility-Administered Medications  Medication Dose Route Frequency Provider Last Rate Last Admin   Chlorhexidine Gluconate Cloth 2 % PADS 6 each  6 each Topical Q0600 Jose Persia, MD   6 each at 06/14/22 0403   feeding supplement (BOOST / RESOURCE BREEZE) liquid 1 Container  1 Container Oral TID BM Lorella Nimrod, MD   1 Container at 06/15/22 2133   multivitamin with minerals tablet 1 tablet  1 tablet Oral Daily Jose Persia, MD   1 tablet at 06/15/22 0919   ondansetron (ZOFRAN) tablet 4 mg  4 mg Oral Q6H PRN Jose Persia, MD       Or   ondansetron (ZOFRAN) injection 4 mg  4 mg Intravenous Q6H PRN Jose Persia, MD       scopolamine (TRANSDERM-SCOP) 1 MG/3DAYS 1.5 mg  1 patch Transdermal Q72H Amin, Soundra Pilon, MD   1.5 mg at 06/15/22 1643   sodium chloride flush (NS) 0.9 % injection 3 mL  3 mL Intravenous Q12H Jose Persia, MD   3 mL at 06/15/22 2133   thiamine (VITAMIN B1) injection 100 mg  100 mg Intravenous Daily Jose Persia, MD   100 mg at 06/15/22 0919    OBJECTIVE: Vitals:   06/16/22 0426 06/16/22 0815  BP: (!) 157/65 (!) 151/61  Pulse: 87 87  Resp: 18   Temp: 97.6 F (36.4 C) 98.4 F (36.9 C)  SpO2: 100% 97%     Body mass index is 19.81 kg/m.    ECOG FS:4 - Bedbound  General: Ill-appearing, thin, no acute distress. Eyes: Pink conjunctiva, anicteric sclera. HEENT: Normocephalic, moist mucous membranes. Lungs: No audible wheezing or coughing. Heart: Regular rate and rhythm. Abdomen: Soft, nontender, no obvious distention. Musculoskeletal: No edema, cyanosis, or clubbing. Neuro: Lethargic, but easily awoken. Cranial nerves grossly intact. Skin: No rashes or petechiae noted. Psych: Flat affect.  LAB  RESULTS:  Lab Results  Component Value Date   NA 147 (H) 06/13/2022   K 3.6 06/13/2022   CL 113 (H) 06/13/2022   CO2 28 06/13/2022   GLUCOSE 117 (H) 06/13/2022   BUN 18 06/13/2022   CREATININE 0.85 06/13/2022   CALCIUM 8.5 (L) 06/13/2022   PROT 6.3 (L) 06/12/2022   ALBUMIN 1.8 (L) 06/12/2022   AST 53 (H) 06/12/2022   ALT 26 06/12/2022   ALKPHOS 283 (H) 06/12/2022   BILITOT 1.2 06/12/2022   GFRNONAA >60 06/13/2022   GFRAA 84 08/13/2020    Lab Results  Component Value Date   WBC 7.7 06/13/2022   NEUTROABS 6.8 06/11/2022   HGB 9.1 (L) 06/13/2022   HCT 27.1 (L) 06/13/2022  MCV 84.2 06/13/2022   PLT 131 (L) 06/13/2022     STUDIES: CT CERVICAL SPINE WO CONTRAST  Result Date: 06/11/2022 CLINICAL DATA:  Neck trauma (Age >= 65y) EXAM: CT CERVICAL SPINE WITHOUT CONTRAST TECHNIQUE: Multidetector CT imaging of the cervical spine was performed without intravenous contrast. Multiplanar CT image reconstructions were also generated. RADIATION DOSE REDUCTION: This exam was performed according to the departmental dose-optimization program which includes automated exposure control, adjustment of the mA and/or kV according to patient size and/or use of iterative reconstruction technique. COMPARISON:  None Available. FINDINGS: Alignment: No substantial sagittal subluxation. Skull base and vertebrae: No evidence of acute fracture. Vertebral body heights are maintained. Soft tissues and spinal canal: No prevertebral fluid or swelling. No visible canal hematoma. Disc levels: Severe multilevel degenerative change. This includes severe multilevel degenerative disease as well as multilevel facet/uncovertebral hypertrophy with varying degrees of neural foraminal stenosis. Upper chest: Biapical pleuroparenchymal scarring. IMPRESSION: 1. No evidence of acute fracture or traumatic malalignment. 2. Severe multilevel degenerative change. Electronically Signed   By: Margaretha Sheffield M.D.   On: 06/11/2022 12:31    DG Chest Port 1 View  Result Date: 06/11/2022 CLINICAL DATA:  Cough EXAM: PORTABLE CHEST 1 VIEW COMPARISON:  CXR 06/10/22 FINDINGS: Right-sided chest port in place with tip at the cavoatrial junction. No pleural effusion. No pneumothorax. Unchanged cardiac and mediastinal contours. There are prominent bilateral interstitial opacities, which are nonspecific and may represent pulmonary venous congestion or atypical infection. There may also be a focal retrocardiac opacity. No radiographically apparent displaced rib fractures. Visualized upper abdomen is unremarkable. IMPRESSION: Prominent bilateral interstitial opacities, which are nonspecific and may represent pulmonary venous congestion or atypical infection. There also may be a retrocardiac opacity which could represent a pneumonia. Recommend two-view chest radiograph for further evaluation. Electronically Signed   By: Marin Roberts M.D.   On: 06/11/2022 11:44   US RENAL  Result Date: 06/11/2022 CLINICAL DATA:  Acute knee injury, history of chronic kidney disease, GERD EXAM: RENAL / URINARY TRACT ULTRASOUND COMPLETE COMPARISON:  CT abdomen 01/18/2022 FINDINGS: Right Kidney: Renal measurements: 11.5 x 3.8 x 6.5 cm = volume: 149 mL. Normal cortical thickness. Increased cortical echogenicity. No mass, hydronephrosis, or shadowing calcification. Trace perinephric fluid RIGHT kidney. Left Kidney: Renal measurements: 10.3 x 4.3 x 5.3 cm = volume: 123 mL. Normal cortical thickness. Increased cortical echogenicity. No mass, hydronephrosis, or shadowing calcification. Bladder: Foley catheter within urinary bladder. Bladder contains minimal urine, poorly assessed. Other: Small BILATERAL pleural effusions.  Mild ascites. IMPRESSION: Medical renal disease changes of both kidneys. No evidence of renal mass or hydronephrosis. Trace perinephric fluid RIGHT kidney. Small BILATERAL pleural effusions and mild ascites. Electronically Signed   By: Lavonia Dana M.D.   On:  06/11/2022 09:25   MR BRAIN WO CONTRAST  Result Date: 06/10/2022 CLINICAL DATA:  Initial evaluation for mental status change, unknown cause. EXAM: MRI HEAD WITHOUT CONTRAST TECHNIQUE: Multiplanar, multiecho pulse sequences of the brain and surrounding structures were obtained without intravenous contrast. COMPARISON:  None available. FINDINGS: Brain: Cerebral volume within normal limits. No significant cerebral white matter disease for age. No evidence for acute or subacute ischemia. Gray-white matter differentiation maintained. No areas of chronic cortical infarction. No acute or chronic intracranial blood products. No mass lesion, midline shift or mass effect no hydrocephalus or extra-axial fluid collection. Pituitary gland and suprasellar region within normal limits. Vascular: Major intracranial vascular flow voids are maintained. Skull and upper cervical spine: Question chronic fracture deformity versus congenital  segmental defect at the dens (series 9, image 11). Diffuse loss of normal bone marrow signal, nonspecific but can be seen with anemia, smoking, obesity, and infiltrative/myelofibrotic marrow processes. No focal marrow replacing lesion. No scalp soft tissue abnormality. Sinuses/Orbits: Globes and orbital soft tissues demonstrate no acute finding. Paranasal sinuses are largely clear. Trace bilateral mastoid effusions, of doubtful significance. Other: None. IMPRESSION: 1. Normal brain MRI for age. No acute intracranial abnormality identified. 2. Question chronic fracture deformity versus congenital segmental defect at the dens. Finding could be further assessed with dedicated CT of the cervical spine as warranted. Electronically Signed   By: Jeannine Boga M.D.   On: 06/10/2022 20:08   DG Chest Port 1 View  Result Date: 06/10/2022 CLINICAL DATA:  Cough EXAM: PORTABLE CHEST 1 VIEW COMPARISON:  CXR 05/18/21 FINDINGS: Right-sided central venous catheter with tip at the cavoatrial junction. No  pleural effusion. No pneumothorax. Unchanged cardiac and mediastinal contours. No focal airspace opacity. No radiographically apparent displaced rib fractures. Visualized upper abdomen is unremarkable. Degenerative changes of the right AC joint. IMPRESSION: No focal airspace opacity. Electronically Signed   By: Marin Roberts M.D.   On: 06/10/2022 12:09    ASSESSMENT: Stage IV pancreatic cancer, declining performance status.  PLAN:    Stage IV pancreatic cancer: Patient last received chemotherapy with gemcitabine and Abraxane on June 03, 2022.  Given her declining performance status and profound weakness and fatigue, no further treatments are recommended.  Once again have recommended to be discharged home with hospice and focus on patient comfort to which she seemed to express understanding.  No further interventions are needed from an oncology standpoint.  Appreciate palliative care input.   Poor appetite: Patient refuses NG tube or PEG tube.   Anemia: Chronic and unchanged.  Patient's hemoglobin is 9.1.  Unclear if checking further labs is necessary. Disposition: Recommended directly to patient that she be DNR/DNI and that she focus on comfort measures and going home with hospice.  Unfortunately, no family at bedside.    Will follow.  Lloyd Huger, MD   06/16/2022 8:48 AM

## 2022-06-16 NOTE — Telephone Encounter (Signed)
She was d/c'd from the hospital today and needs a 2 week follow up to which there are no appointment slots open for per Melissa. Please advise.

## 2022-06-16 NOTE — Telephone Encounter (Signed)
Spoke to son and he will get Meredith Wilcox, pts spouse to call us back to get appt set up    Copied from [place] [phone]. Topic: Appointment Scheduling - Scheduling Inquiry for Clinic >> Jun 16, 2022  1:03 PM Cyndi Bender wrote: Reason for CRM: Pt needs hospital fu appt with Dr. Ancil Boozer but there were no hospital fu appts available within 2 weeks. Please contact pt to schedule hospital fu appt with Dr. Ancil Boozer

## 2022-06-16 NOTE — Progress Notes (Signed)
Ambulatory referral to palliative care 

## 2022-06-16 NOTE — Discharge Summary (Signed)
Physician Discharge Summary   Patient: Meredith Wilcox MRN: :[id number] DOB: 01/22/1941  Admit date:     06/10/2022  Discharge date: 06/16/22  Discharge Physician: Meredith Wilcox   PCP: Meredith Sizer, MD   Recommendations at discharge:  Follow-up with primary care provider Follow-up with oncology  Discharge Diagnoses: Principal Problem:   AKI (acute kidney injury) ([place]) Active Problems:   Malignant tumor of head of pancreas ([place])   Ground-level fall   Generalized weakness   Thrombocytopenia (HCC)   Hypertension   Protein-calorie malnutrition, severe   Anemia of chronic disease   Hypokalemia   Acute kidney injury ([place])   Palliative care encounter   Hospital Course: 82 y.o. 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   Meredith Wilcox states that 2-3 days ago, she was walking in her living room to plug in a radio. When she bent over to pick up the cable, she felt herself falling down onto the ground.  She does not believe that she hit her head or lost consciousness.  Prior to the fall, she denies any dizziness, chest pain, shortness of breath or palpitations.  After the fall, she notes that she was so weak she was unable to stand up on her own.  She states generalized weakness has been ongoing and is worse after chemotherapy, which her last infusion was on 06/03/2022. She laid on the ground for at least 2 hours.  She cannot recall how she was able to stand up or if someone helped her stand up.  Her husband at bedside states he was not the one to help her up and he does not know how she got up off the ground.  Since that time, she has been experiencing increasing generalized weakness and decreased appetite.  She has not eaten in at least 2 days and has had very little water.  Due to this, she decided to come to the ER today.  At this time, Meredith Wilcox denies any fever,  chills, nausea, vomiting, diarrhea, abdominal pain, chest pain, shortness of breath or palpitations.  She notes that she has had a cough since starting chemotherapy but her sputum states seems thicker and more difficult to swallow in the last few days.   Per chart review, patient's daughter found her mother on the ground on the evening of 2/28.  3/1.  Patient not eating.  I will continue IV fluids.  Awaiting palliative care consultation.  Repeat chest x-ray to see if pneumonia.  3/2: Mild hypothermia this morning, maximum temperature recorded was 101 over the past 24 hours.  Repeat chest x-ray with bilateral interstitial opacities, which may represent pulmonary venous congestion or atypical infection.  There was also noted to have a retrocardiac opacity which can be pneumonia.  CT cervical spine was negative for any acute abnormality.  Hemoglobin decreased to 6.8, BMP with mild hyponatremia at 146, potassium low at 3.2, AKI resolved.  Continuing IV fluid. Palliative care with current scope of care, family to decide about CODE STATUS. Had another discussion and they will continue to keep current level of care, 1 unit of PRBC ordered.  Family was also inquiring about tube feeding.  3/3: Vital stable with hemoglobin improvement to 9.1, slight worsening of hypernatremia at 147 with water deficit of 1.1 L.  Giving some D5.  Potassium normalized at 3.6. P.o. intake remained poor.  Patient does not want feeding tube.  We will  remove Foley catheter today to give her a voiding trial. Family would like to hear from Meredith Wilcox regarding further management and prognosis.  3/4: Patient remained lethargic with poor p.o. intake.  Stating that she does not have much appetite.  Palliative care met with family but they have not decided about the CODE STATUS and would like to give her maximum chances.  They were insisting on placing NG tube for artificial feeding.  Discussed with Meredith Wilcox from oncology and she will  not be a good candidate as it will not change the disease trajectory.  Patient will be high risk for aspiration due to worsening weakness and lethargy, at times she is unable to clear her own secretions.  Nausea and vomiting are very common features of advance pancreatic malignancy.  Meredith Wilcox will see her later today.  Patient is approaching end-of-life and appropriate for hospice and comfort care only.  3/5; patient remained lethargic and weak, slightly improved p.o. intake this morning.  Discussed with patient and she does not want NG tube.  She would like to remain full code. Tried explaining her situation when a friend from church community step in and was getting upset that we are taking her hope away.  Patient is not a candidate for continuation of any cancer treatment at this time.  Very poor prognosis.  Maximum home health services and equipments to help at home ordered as daughter would like to take her back home.  3/6: Vital stable.  Patient remained very lethargic and weak.  Per patient she is eating little better than before.  She was concerned about the cost of home health and equipments, adopt a call the patient to go over her copayments and then patient and family can decide what they want.  We ordered maximum home health services and equipments to help at home.  Patient is eligible for hospice but not ready yet.  She can follow-up with her oncologist and palliative services at cancer center for further help if she changes her mind.  Very poor prognosis.  Patient is being discharged on low-dose of amlodipine which she was supposed to take it but apparently was not taking it for mildly elevated blood pressure.  Patient does not have any pain and if she develops some cancer related pain she can contact her oncologist.  She will continue on current management.  Advised to supplement her diet with high caloric boost or Ensure to meet some of her nutritional requirement.  Patient is very  high risk for readmission, deterioration and mortality.  Assessment and Plan: * AKI (acute kidney injury) ([place]) Resolved with IV fluid. Creatinine 1.61 on presentation  - Continue IV fluids with the patient not eating.  Ground-level fall MRI did not show any acute stroke.  Question of chronic fracture deformity versus congenital segmental defect at the dens.  CT cervical spine ordered to further clarify.  PT and OT recommending home health  Malignant tumor of head of pancreas [facility]) Patient has a history of stage IV adenocarcinoma of the pancreas currently undergoing chemotherapy with Gemcitabine/Abraxane.  Case discussed with oncology and palliative care.  Patient still listed as a full code.  Overall prognosis poor especially if she does not eat.  Generalized weakness Patient with profound weakness that is equal throughout, likely secondary to progressive stage IV cancer undergoing chemotherapy.  PT and OT consultation  Thrombocytopenia ([place]) Likely secondary to chemotherapy.  Platelet count up at 132.  Hypokalemia Stable and persistent hypokalemia with potassium at  3.2.  Magnesium within normal limit. -Replace potassium and monitor   Anemia of chronic disease Hemoglobin decreased to 6.8 this morning.  Chemotherapy can be contributory. Patient has anemia of chronic disease due to advanced malignancy. -Ordered 1 unit of PRBC -Monitor hemoglobin  Protein-calorie malnutrition, severe With failure to thrive.  Palliative care was consulted at patient's family would like to continue current level of care.  They were also inquiring about feeding tube. I discussed today with patient and she does not want any feeding tube         Consultants: Oncology,.  Palliative care Procedures performed: None Disposition: Home health Diet recommendation:  Discharge Diet Orders (From admission, onward)     Start     Ordered   06/16/22 0000  Diet - low sodium heart healthy        06/16/22  1243           Regular diet DISCHARGE MEDICATION: Allergies as of 06/16/2022       Reactions   Tussionex Pennkinetic Er [hydrocod Poli-chlorphe Poli Er] Other (See Comments)   Real dizzy headed even after a while still walking every direction but straight, patient stated she stopped taking it because of that.   Hydrocodone    Light headed and dizziness        Medication List     STOP taking these medications    prochlorperazine 10 MG tablet Commonly known as: COMPAZINE       TAKE these medications    amLODipine 2.5 MG tablet Commonly known as: NORVASC Take 1 tablet (2.5 mg total) by mouth every evening.   lidocaine-prilocaine cream Commonly known as: EMLA Apply to affected area once   ondansetron 4 MG tablet Commonly known as: ZOFRAN Take 1 tablet (4 mg total) by mouth every 6 (six) hours as needed for nausea. What changed:  medication strength how much to take when to take this reasons to take this               Durable Medical Equipment  (From admission, onward)           Start     Ordered   06/15/22 1431  For home use only DME 3 n 1  Once        06/15/22 1430   06/15/22 1430  For home use only DME Hospital bed  Once       Question Answer Comment  Length of Need Lifetime   Patient has (list medical condition): Stage IV pancreatic cancer   The above medical condition requires: Patient requires the ability to reposition frequently   Head must be elevated greater than: 30 degrees   Bed type Semi-electric   Trapeze Bar Yes   Support Surface: Gel Overlay      06/15/22 1430   06/15/22 1429  For home use only DME lightweight manual wheelchair with seat cushion  Once       Comments: Patient suffers from generalized weakness which impairs their ability to perform daily activities like bathing, dressing, grooming, and toileting in the home.  A walker will not resolve  issue with performing activities of daily living. A wheelchair will allow  patient to safely perform daily activities. Patient is not able to propel themselves in the home using a standard weight wheelchair due to general weakness. Patient can self propel in the lightweight wheelchair. Length of need Lifetime. Accessories: elevating leg rests (ELRs), wheel locks, extensions and anti-tippers.   06/15/22 1430   06/14/22  0932  For home use only DME Bedside commode  Once       Question:  Patient needs a bedside commode to treat with the following condition  Answer:  Weakness   06/14/22 0931   06/14/22 0931  For home use only DME Walker rolling  Once       Question Answer Comment  Walker: With [place]   Patient needs a walker to treat with the following condition Weakness      06/14/22 0931            Follow-up Information     [facility] Follow up.   Specialty: Home Health Services Why: They will follow up with you for your home health needs. Contact information: [place] [address] [phone]         Meredith Sizer, MD. Schedule an appointment as soon as possible for a visit in 1 week(s).   Specialty: Family Medicine Contact information: [address] [place] [address] [phone]                Discharge Exam: Danley Danker Weights   06/14/22 0500 06/15/22 0704 06/16/22 0454  Weight: 53.5 kg 53 kg 54 kg   General.  Frail and cachectic elderly lady, in no acute distress. Pulmonary.  Lungs clear bilaterally, normal respiratory effort. CV.  Regular rate and rhythm, no JVD, rub or murmur. Abdomen.  Soft, nontender, nondistended, BS positive. CNS.  Alert and oriented .  No focal neurologic deficit. Extremities.  No edema, no cyanosis, pulses intact and symmetrical. Psychiatry.  Judgment and insight appears normal.   Condition at discharge: stable  The results of significant diagnostics from this hospitalization (including imaging, microbiology, ancillary and laboratory) are  listed below for reference.   Imaging Studies: CT CERVICAL SPINE WO CONTRAST  Result Date: 06/11/2022 CLINICAL DATA:  Neck trauma (Age >= 65y) EXAM: CT CERVICAL SPINE WITHOUT CONTRAST TECHNIQUE: Multidetector CT imaging of the cervical spine was performed without intravenous contrast. Multiplanar CT image reconstructions were also generated. RADIATION DOSE REDUCTION: This exam was performed according to the departmental dose-optimization program which includes automated exposure control, adjustment of the mA and/or kV according to patient size and/or use of iterative reconstruction technique. COMPARISON:  None Available. FINDINGS: Alignment: No substantial sagittal subluxation. Skull base and vertebrae: No evidence of acute fracture. Vertebral body heights are maintained. Soft tissues and spinal canal: No prevertebral fluid or swelling. No visible canal hematoma. Disc levels: Severe multilevel degenerative change. This includes severe multilevel degenerative disease as well as multilevel facet/uncovertebral hypertrophy with varying degrees of neural foraminal stenosis. Upper chest: Biapical pleuroparenchymal scarring. IMPRESSION: 1. No evidence of acute fracture or traumatic malalignment. 2. Severe multilevel degenerative change. Electronically Signed   By: Margaretha Sheffield M.D.   On: 06/11/2022 12:31   DG Chest Port 1 View  Result Date: 06/11/2022 CLINICAL DATA:  Cough EXAM: PORTABLE CHEST 1 VIEW COMPARISON:  CXR 06/10/22 FINDINGS: Right-sided chest port in place with tip at the cavoatrial junction. No pleural effusion. No pneumothorax. Unchanged cardiac and mediastinal contours. There are prominent bilateral interstitial opacities, which are nonspecific and may represent pulmonary venous congestion or atypical infection. There may also be a focal retrocardiac opacity. No radiographically apparent displaced rib fractures. Visualized upper abdomen is unremarkable. IMPRESSION: Prominent bilateral interstitial  opacities, which are nonspecific and may represent pulmonary venous congestion or atypical infection. There also may be a retrocardiac opacity which could represent a pneumonia. Recommend  two-view chest radiograph for further evaluation. Electronically Signed   By: Marin Roberts M.D.   On: 06/11/2022 11:44   US RENAL  Result Date: 06/11/2022 CLINICAL DATA:  Acute knee injury, history of chronic kidney disease, GERD EXAM: RENAL / URINARY TRACT ULTRASOUND COMPLETE COMPARISON:  CT abdomen 01/18/2022 FINDINGS: Right Kidney: Renal measurements: 11.5 x 3.8 x 6.5 cm = volume: 149 mL. Normal cortical thickness. Increased cortical echogenicity. No mass, hydronephrosis, or shadowing calcification. Trace perinephric fluid RIGHT kidney. Left Kidney: Renal measurements: 10.3 x 4.3 x 5.3 cm = volume: 123 mL. Normal cortical thickness. Increased cortical echogenicity. No mass, hydronephrosis, or shadowing calcification. Bladder: Foley catheter within urinary bladder. Bladder contains minimal urine, poorly assessed. Other: Small BILATERAL pleural effusions.  Mild ascites. IMPRESSION: Medical renal disease changes of both kidneys. No evidence of renal mass or hydronephrosis. Trace perinephric fluid RIGHT kidney. Small BILATERAL pleural effusions and mild ascites. Electronically Signed   By: Lavonia Dana M.D.   On: 06/11/2022 09:25   MR BRAIN WO CONTRAST  Result Date: 06/10/2022 CLINICAL DATA:  Initial evaluation for mental status change, unknown cause. EXAM: MRI HEAD WITHOUT CONTRAST TECHNIQUE: Multiplanar, multiecho pulse sequences of the brain and surrounding structures were obtained without intravenous contrast. COMPARISON:  None available. FINDINGS: Brain: Cerebral volume within normal limits. No significant cerebral white matter disease for age. No evidence for acute or subacute ischemia. Gray-white matter differentiation maintained. No areas of chronic cortical infarction. No acute or chronic intracranial blood products.  No mass lesion, midline shift or mass effect no hydrocephalus or extra-axial fluid collection. Pituitary gland and suprasellar region within normal limits. Vascular: Major intracranial vascular flow voids are maintained. Skull and upper cervical spine: Question chronic fracture deformity versus congenital segmental defect at the dens (series 9, image 11). Diffuse loss of normal bone marrow signal, nonspecific but can be seen with anemia, smoking, obesity, and infiltrative/myelofibrotic marrow processes. No focal marrow replacing lesion. No scalp soft tissue abnormality. Sinuses/Orbits: Globes and orbital soft tissues demonstrate no acute finding. Paranasal sinuses are largely clear. Trace bilateral mastoid effusions, of doubtful significance. Other: None. IMPRESSION: 1. Normal brain MRI for age. No acute intracranial abnormality identified. 2. Question chronic fracture deformity versus congenital segmental defect at the dens. Finding could be further assessed with dedicated CT of the cervical spine as warranted. Electronically Signed   By: Jeannine Boga M.D.   On: 06/10/2022 20:08   DG Chest Port 1 View  Result Date: 06/10/2022 CLINICAL DATA:  Cough EXAM: PORTABLE CHEST 1 VIEW COMPARISON:  CXR 05/18/21 FINDINGS: Right-sided central venous catheter with tip at the cavoatrial junction. No pleural effusion. No pneumothorax. Unchanged cardiac and mediastinal contours. No focal airspace opacity. No radiographically apparent displaced rib fractures. Visualized upper abdomen is unremarkable. Degenerative changes of the right AC joint. IMPRESSION: No focal airspace opacity. Electronically Signed   By: Marin Roberts M.D.   On: 06/10/2022 12:09    Microbiology: Results for orders placed or performed in visit on 07/06/21  Urine Culture     Status: None   Collection Time: 07/06/21 10:07 AM   Specimen: Urine  Result Value Ref Range Status   MICRO NUMBER: [id number]  Final   SPECIMEN QUALITY: Adequate  Final    Sample Source URINE  Final   STATUS: FINAL  Final   Result: No Growth  Final    Labs: CBC: Recent Labs  Lab 06/10/22 1139 06/11/22 0417 06/12/22 0403 06/12/22 1636 06/13/22 0358  WBC 7.7 7.4 5.6  --  7.7  NEUTROABS 7.0 6.8  --   --   --   HGB 7.6* 7.6* 6.8* 8.7* 9.1*  HCT 23.7* 22.8* 20.5* 26.0* 27.1*  MCV 87.5 84.8 85.1  --  84.2  PLT 109* 132* 112*  --  [id number]*   Basic Metabolic Panel: Recent Labs  Lab 06/10/22 1139 06/10/22 1328 06/11/22 0417 06/12/22 0403 06/13/22 0358  NA 138  --  142 146* 147*  K 4.6  --  3.2* 3.2* 3.6  CL 99  --  105 110 113*  CO2 24  --  '24 28 28  '$ GLUCOSE 77  --  65* 121* 117*  BUN 43*  --  34* 26* 18  CREATININE 1.61*  --  1.23* 0.97 0.85  CALCIUM 8.3*  --  8.3* 8.4* 8.5*  MG  --  2.2  --  2.1  --   PHOS  --  3.9  --  2.5  --    Liver Function Tests: Recent Labs  Lab 06/10/22 1139 06/12/22 0403  AST 50* 53*  ALT 30 26  ALKPHOS 252* 283*  BILITOT 1.5* 1.2  PROT 6.8 6.3*  ALBUMIN 2.3* 1.8*   CBG: No results for input(s): "GLUCAP" in the last 168 hours.  Discharge time spent: greater than 30 minutes.  This record has been created using Systems analyst. Errors have been sought and corrected,but may not always be located. Such creation errors do not reflect on the standard of care.   Signed: Lorella Nimrod, MD [facility] 06/16/2022

## 2022-06-16 NOTE — TOC Transition Note (Addendum)
Transition of Care [facility]) - CM/SW Discharge Note   Patient Details  Name: Meredith Wilcox MRN: [id number] Date of Birth: 11-28-1940  Transition of [facility]) CM/SW Contact:  Ross Ludwig, LCSW Phone Number: 06/16/2022, 2:35 PM   Clinical Narrative:     Patient will be going home with home health through [place].  TOC signing off please reconsult with any other TOC needs, home health agency has been notified of planned discharge.  Patient and daughter requesting EMS transport due to weakness and high risk of falls.  CSW contacted [facility] at 2:15pm to arrange transportation.   Final next level of care: Home w Home Health Services Barriers to Discharge: Barriers Resolved   Patient Goals and CMS Choice CMS Medicare.gov Compare Post Acute Care list provided to:: Patient Represenative (must comment) Choice offered to / list presented to : Adult Children, Patient  Discharge Placement                    Name of family member notified: Patient's daughter is aware she is returning home today. Patient and family notified of of transfer: 06/16/22  Discharge Plan and Services Additional resources added to the After Visit Summary for                            [facility] Arranged: PT, OT, RN, Nurse's Aide, Social Work [employer] Agency: [place] ([place]) Date [place]: 06/16/22 Time [place]: 1200 Representative spoke with at [place]: [place] ([place]) Interventions SDOH Screenings   Food Insecurity: No Food Insecurity (06/10/2022)  Housing: Low Risk  (06/10/2022)  Transportation Needs: No Transportation Needs (06/10/2022)  Utilities: Not At Risk (06/10/2022)  Alcohol Screen: Low Risk  (11/08/2019)  Depression (PHQ2-9): Low Risk  (03/15/2022)  Financial Resource Strain: Low Risk  (12/31/2021)  Physical Activity: Sufficiently Active (12/31/2021)  Social Connections: Socially Integrated (12/31/2021)  Stress: No Stress  Concern Present (12/31/2021)  Tobacco Use: Low Risk  (06/10/2022)     Readmission Risk Interventions     No data to display

## 2022-06-17 ENCOUNTER — Telehealth: Payer: Self-pay

## 2022-06-17 ENCOUNTER — Ambulatory Visit: Payer: Medicare Other | Admitting: Oncology

## 2022-06-17 ENCOUNTER — Ambulatory Visit: Payer: Medicare Other

## 2022-06-17 NOTE — Transitions of Care (Post Inpatient/ED Visit) (Signed)
   06/17/2022  Name: Latania Lewers MRN: [id number] DOB: 1940-06-14  Today's TOC FU Call Status: Today's TOC FU Call Status:: Successful TOC FU Call Competed TOC FU Call Complete Date: 06/17/22  Transition Care Management Follow-up Telephone Call Date of Discharge: 06/16/22 Discharge Facility: [facility]) Type of Discharge: Inpatient Admission Primary Inpatient Discharge Diagnosis:: Acute Kidney Injury; Pancreatic Cancer Stage IV How have you been since you were released from the hospital?: Better Any questions or concerns?: Yes Patient Questions/Concerns:: Daughter asked for the best medication to give her Mom for phlegm. Message sent to PCP Patient Questions/Concerns Addressed: Notified Provider of Patient Questions/Concerns  Items Reviewed: Did you receive and understand the discharge instructions provided?: Yes Medications obtained and verified?: Yes (Medications Reviewed) Any new allergies since your discharge?: No Dietary orders reviewed?: NA Do you have support at home?: Yes People in Home: child(ren), adult, spouse Name of Support/Comfort Primary Source: Spouse and daughter, [place] [facility] and Equipment/Supplies: [place] Ordered?: Yes Name of [place]:: Alvis Lemmings Has Agency set up a time to come to your home?: Yes [place] Visit Date: 06/19/22 Any new equipment or medical supplies ordered?: No  Functional Questionnaire: Do you need assistance with bathing/showering or dressing?: Yes Do you need assistance with meal preparation?: Yes Do you need assistance with eating?: No Do you have difficulty maintaining continence: No Do you need assistance with getting out of bed/getting out of a chair/moving?: Yes Do you have difficulty managing or taking your medications?: Yes  Folllow up appointments reviewed: PCP Follow-up appointment confirmed?: Yes Date of PCP follow-up appointment?: 06/29/22 Follow-up Provider:  Dr. Ancil Boozer Specialist [facility] Follow-up appointment confirmed?: Yes Date of Specialist follow-up appointment?: 06/21/22 Follow-Up Specialty Provider:: Kate Sable, NP Do you need transportation to your follow-up appointment?: No Do you understand care options if your condition(s) worsen?: Yes-patient verbalized understanding Interventions Today    Flowsheet Row Most Recent Value  Chronic Disease   Chronic disease during today's visit Other  [Pancreatic Cancer]  General Interventions   General Interventions Discussed/Reviewed Referral to Nurse  Nutrition Interventions   Nutrition Discussed/Reviewed Nutrition Discussed       Johnney Killian, RN, BSN, CCM Care Management Coordinator [facility] Phone: [phone]: [phone]

## 2022-06-17 NOTE — Telephone Encounter (Signed)
Returned pt call. Lvm for [name] to return call

## 2022-06-17 NOTE — Telephone Encounter (Signed)
Pt husband is calling to follow up on h/f for pt. Pt only wants to see Dr.Sowles.     Please advise.

## 2022-06-17 NOTE — Transitions of Care (Post Inpatient/ED Visit) (Signed)
   06/17/2022  Name: Meredith Wilcox MRN: [id number] DOB: 15-Apr-1940  Today's TOC FU Call Status: Today's TOC FU Call Status:: Unsuccessul Call (1st Attempt) Unsuccessful Call (1st Attempt) Date: 06/17/22  Attempted to reach the patient regarding the most recent Inpatient/ED visit.  Follow Up Plan: Additional outreach attempts will be made to reach the patient to complete the Transitions of Care (Post Inpatient/ED visit) call.   Johnney Killian, RN, BSN, CCM Care Management Coordinator /[facility] Phone: [phone]: [phone]

## 2022-06-17 NOTE — Patient Outreach (Signed)
  Care Coordination   Note   06/17/2022 Name: Heide Oelfke MRN: [id number] DOB: [birth year]  Received message back from Altha Harm, NP that the family can give patient over the counter Mucinex for phlegm.  Contacted patients daughter, Elmo Putt, and notified. She will get the medication and follow the standard instructions on packaging.  Johnney Killian, RN, BSN, CCM Care Management Coordinator [place]/[facility] Phone: [phone]: [phone]

## 2022-06-17 NOTE — Telephone Encounter (Signed)
[name] returned call and I sch'd appt with Dr Ancil Boozer

## 2022-06-18 ENCOUNTER — Telehealth: Payer: Self-pay

## 2022-06-18 NOTE — Telephone Encounter (Signed)
Spoke with husband the other day and we had already set her a hospital follow up

## 2022-06-18 NOTE — Telephone Encounter (Signed)
Palliative care outreach to patient to schedule in home PC visit.  Call unsuccessful. SW LVM.

## 2022-06-18 NOTE — Telephone Encounter (Signed)
Left voice mail relaying advice for mucinex per Dr. Ancil Boozer

## 2022-06-21 ENCOUNTER — Telehealth: Payer: Self-pay | Admitting: Family Medicine

## 2022-06-21 ENCOUNTER — Telehealth: Payer: Self-pay

## 2022-06-21 ENCOUNTER — Inpatient Hospital Stay: Payer: Medicare Other | Attending: Oncology | Admitting: Hospice and Palliative Medicine

## 2022-06-21 DIAGNOSIS — C250 Malignant neoplasm of head of pancreas: Secondary | ICD-10-CM | POA: Diagnosis not present

## 2022-06-21 DIAGNOSIS — E876 Hypokalemia: Secondary | ICD-10-CM | POA: Diagnosis not present

## 2022-06-21 DIAGNOSIS — E43 Unspecified severe protein-calorie malnutrition: Secondary | ICD-10-CM | POA: Diagnosis not present

## 2022-06-21 DIAGNOSIS — D630 Anemia in neoplastic disease: Secondary | ICD-10-CM | POA: Diagnosis not present

## 2022-06-21 DIAGNOSIS — I10 Essential (primary) hypertension: Secondary | ICD-10-CM | POA: Diagnosis not present

## 2022-06-21 DIAGNOSIS — K219 Gastro-esophageal reflux disease without esophagitis: Secondary | ICD-10-CM | POA: Diagnosis not present

## 2022-06-21 DIAGNOSIS — M47812 Spondylosis without myelopathy or radiculopathy, cervical region: Secondary | ICD-10-CM | POA: Diagnosis not present

## 2022-06-21 DIAGNOSIS — R188 Other ascites: Secondary | ICD-10-CM | POA: Diagnosis not present

## 2022-06-21 DIAGNOSIS — J90 Pleural effusion, not elsewhere classified: Secondary | ICD-10-CM | POA: Diagnosis not present

## 2022-06-21 DIAGNOSIS — D696 Thrombocytopenia, unspecified: Secondary | ICD-10-CM | POA: Diagnosis not present

## 2022-06-21 DIAGNOSIS — E785 Hyperlipidemia, unspecified: Secondary | ICD-10-CM | POA: Diagnosis not present

## 2022-06-21 DIAGNOSIS — Z681 Body mass index (BMI) 19 or less, adult: Secondary | ICD-10-CM | POA: Diagnosis not present

## 2022-06-21 DIAGNOSIS — Z9181 History of falling: Secondary | ICD-10-CM | POA: Diagnosis not present

## 2022-06-21 NOTE — Telephone Encounter (Signed)
1015 am.  Phone call made to patient to schedule a home visit.  Spouse advised patient with daughter Elmo Putt and requested I contact her.  Phone call made to [place].  No answer.  Message has been left requesting a call back.

## 2022-06-21 NOTE — Telephone Encounter (Signed)
Called and lvm for verbal orders

## 2022-06-21 NOTE — Telephone Encounter (Signed)
Home Health Verbal Orders - Caller/Agency: Kenney Houseman RN [place] Number: [phone] Requesting OT/PT/Skilled Nursing/Social Work/Speech Therapy: Skilled Nursing  Frequency:   1w4   RN says that they really need to consider hospice

## 2022-06-21 NOTE — Progress Notes (Signed)
I spoke with patient's daughter.  Patient is currently staying with daughter.  Home health PT came out today.  Patient remains weak with poor oral intake.  We again discussed the option of hospice involvement and I recommended the daughter transition to hospice following completion of home health.

## 2022-06-22 ENCOUNTER — Telehealth: Payer: Self-pay

## 2022-06-22 ENCOUNTER — Telehealth: Payer: Self-pay | Admitting: *Deleted

## 2022-06-22 DIAGNOSIS — D630 Anemia in neoplastic disease: Secondary | ICD-10-CM | POA: Diagnosis not present

## 2022-06-22 DIAGNOSIS — C250 Malignant neoplasm of head of pancreas: Secondary | ICD-10-CM | POA: Diagnosis not present

## 2022-06-22 DIAGNOSIS — E876 Hypokalemia: Secondary | ICD-10-CM | POA: Diagnosis not present

## 2022-06-22 DIAGNOSIS — I10 Essential (primary) hypertension: Secondary | ICD-10-CM | POA: Diagnosis not present

## 2022-06-22 DIAGNOSIS — D696 Thrombocytopenia, unspecified: Secondary | ICD-10-CM | POA: Diagnosis not present

## 2022-06-22 DIAGNOSIS — E43 Unspecified severe protein-calorie malnutrition: Secondary | ICD-10-CM | POA: Diagnosis not present

## 2022-06-22 NOTE — Telephone Encounter (Signed)
Telephone call to patients daughter, Elmo Putt, in attempt to schedule initial Palliative care visit/consult.  Call unsuccessful. SW LVM with contact information.

## 2022-06-22 NOTE — Telephone Encounter (Signed)
Copied from [place] [phone]. Topic: Transportation - Transportation >> Jun 22, 2022  3:08 PM [place] B wrote: Reason for CRM: Patients husband called in needs transportaiton for appt on 03/19. Please call back

## 2022-06-22 NOTE — Patient Outreach (Signed)
  Care Coordination   06/22/2022 Name: Amany Rando MRN: [id number] DOB: [birth year]   Care Coordination Outreach Attempts:  An unsuccessful telephone outreach was attempted today to offer the patient information about available care coordination services as a benefit of their health plan.   Follow Up Plan:  Additional outreach attempts will be made to offer the patient care coordination information and services.   Encounter Outcome:  No Answer   Care Coordination Interventions:  No, not indicated    Azaya Goedde, [place] Worker  [facility] Care Management [phone]

## 2022-06-22 NOTE — Telephone Encounter (Signed)
Telephone call to patients daughter to schedule in home initial palliative care visit.   Visit scheduled for Thu 3/14 @ 11. Daughter shared that patient is currently residing with her at : [address] [place], [place] [address]

## 2022-06-23 ENCOUNTER — Telehealth: Payer: Self-pay | Admitting: *Deleted

## 2022-06-23 ENCOUNTER — Encounter: Payer: Self-pay | Admitting: *Deleted

## 2022-06-23 NOTE — Patient Outreach (Signed)
  Care Coordination   Initial Visit Note   06/23/2022 Name: Meredith Wilcox MRN: [id number] DOB: 1941-02-03 Late Entry Meredith Wilcox is a 82 y.o. year old female who sees [name], MD for primary care. I spoke with  Meredith Wilcox by phone today.  What matters to the patients health and wellness today?  Transportation to medical appointments and obtaining a wheelchair.   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 Wheelchair not delivered, [employer] contacted to confirm order-order received and will call family today to coordinate delivery-patient t referred to careguide to arrange trans to appt]  Wheelchair Standard       Follow up plan: Follow up call scheduled for 06/23/22    Encounter Outcome:  Pt. Visit Completed

## 2022-06-23 NOTE — Telephone Encounter (Signed)
   Telephone encounter was:  Unsuccessful.  06/23/2022 Name: [name] MRN: [id number] DOB: 04-Jun-1940  Unsuccessful outbound call made today to assist with:  Transportation Needs   Outreach Attempt:  1st Attempt  A HIPAA compliant voice message was left requesting a return call.  Instructed patient to call back at [phone].  [place] [phone] [address] , [place] [address] Email : [email or web address]

## 2022-06-23 NOTE — Patient Instructions (Signed)
Visit Information  Thank you for taking time to visit with me today. Please don't hesitate to contact me if I can be of assistance to you.   Following are the goals we discussed today:   Goals Addressed             This Visit's Progress    Transportation to hospital follow up appointment       Care Coordination Interventions: Discussed transportation resources to hospital follow up appointment [employer] contacted to follow up on wheelchair delivery-wheelchair needed for transport          Our next appointment is by telephone on 06/23/22   Please call the care guide team at [phone] if you need to cancel or reschedule your appointment.   If you are experiencing a Mental Health or [place] or need someone to talk to, please call 911   Patient verbalizes understanding of instructions and care plan provided today and agrees to view in [place]. Active MyChart status and patient understanding of how to access instructions and care plan via MyChart confirmed with patient.     Telephone follow up appointment with care management team member scheduled for: 06/23/22  Elliot Gurney, [place] Worker  [facility] Care Management [phone]

## 2022-06-23 NOTE — Telephone Encounter (Signed)
This encounter was created in error - please disregard.

## 2022-06-24 ENCOUNTER — Other Ambulatory Visit: Payer: Medicare Other

## 2022-06-24 ENCOUNTER — Ambulatory Visit: Payer: Medicare Other

## 2022-06-24 ENCOUNTER — Telehealth: Payer: Self-pay | Admitting: *Deleted

## 2022-06-24 ENCOUNTER — Ambulatory Visit: Payer: Medicare Other | Admitting: Oncology

## 2022-06-24 VITALS — BP 138/68 | HR 93 | Temp 97.8°F

## 2022-06-24 DIAGNOSIS — I10 Essential (primary) hypertension: Secondary | ICD-10-CM | POA: Diagnosis not present

## 2022-06-24 DIAGNOSIS — D630 Anemia in neoplastic disease: Secondary | ICD-10-CM | POA: Diagnosis not present

## 2022-06-24 DIAGNOSIS — E876 Hypokalemia: Secondary | ICD-10-CM | POA: Diagnosis not present

## 2022-06-24 DIAGNOSIS — Z515 Encounter for palliative care: Secondary | ICD-10-CM

## 2022-06-24 DIAGNOSIS — C250 Malignant neoplasm of head of pancreas: Secondary | ICD-10-CM | POA: Diagnosis not present

## 2022-06-24 DIAGNOSIS — D696 Thrombocytopenia, unspecified: Secondary | ICD-10-CM | POA: Diagnosis not present

## 2022-06-24 DIAGNOSIS — E43 Unspecified severe protein-calorie malnutrition: Secondary | ICD-10-CM | POA: Diagnosis not present

## 2022-06-24 NOTE — Telephone Encounter (Signed)
   Telephone encounter was:  Unsuccessful.  06/24/2022 Name: Meredith Wilcox MRN: [id number] DOB: 1940/08/24  Unsuccessful outbound call made today to assist with:  Transportation Needs  Called patient and daughter to leave message to book transportation  Outreach Attempt:  2nd Attempt  A HIPAA compliant voice message was left requesting a return call.  Instructed patient to call back at [phone].  [place] Negron Torres [phone] [address] , [place] [address] Email : [email or web address]

## 2022-06-24 NOTE — Patient Outreach (Signed)
  Care Coordination   Follow Up Visit Note   06/24/2022 Name: Shoni Quijas MRN: [id number] DOB: 08/21/1940 Late entry 06/23/22 Anyelina Claycomb is a 82 y.o. year old female who sees [name], MD for primary care. I spoke with  Amee Devoss's daughter and [employer] by phone today.  What matters to the patients health and wellness today?  DME and transportation to medical appointments    Goals Addressed             This Visit's Progress    Transportation to hospital follow up appointment       Care Coordination Interventions: Discussed transportation needs with [employer] and need for wheelchair [employer] contacted to follow up on wheelchair delivery-wheelchair needed for transport-they will call back with status of wheelchair delivery          SDOH assessments and interventions completed:  No     Care Coordination Interventions:  Yes, provided  Interventions Today    Flowsheet Row Most Recent Value  Chronic Disease   Chronic disease during today's visit Other  [pacreatic cancer]  General Interventions   General Interventions Discussed/Reviewed General Interventions Reviewed, Durable Medical Equipment (DME), Communication with  [[employer] [facility] contacted to follow up on wheelchair [place] care to follow up with this social worker regarding status of delivery]  Museum/gallery conservator (DME) Community education officer  Communication with --  [[employer] [facility] regarding status of wheelchair, they will call back  once status is confirmed]       Follow up plan: Follow up call scheduled for 06/25/22    Encounter Outcome:  Pt. Visit Completed

## 2022-06-24 NOTE — Patient Instructions (Signed)
Visit Information  Thank you for taking time to visit with me today. Please don't hesitate to contact me if I can be of assistance to you.   Following are the goals we discussed today:   Goals Addressed             This Visit's Progress    Transportation to hospital follow up appointment       Care Coordination Interventions: Discussed transportation needs with [employer] and need for wheelchair [employer] contacted to follow up on wheelchair delivery-wheelchair needed for transport-they will call back with status of wheelchair delivery          Our next appointment is by telephone on 06/25/22 at 3pm  Please call the care guide team at [phone] if you need to cancel or reschedule your appointment.   If you are experiencing a Mental Health or [employer] or need someone to talk to, please call 911   Patient verbalizes understanding of instructions and care plan provided today and agrees to view in [place]. Active MyChart status and patient understanding of how to access instructions and care plan via MyChart confirmed with patient.     Telephone follow up appointment with care management team member scheduled for: 06/25/22  Elliot Gurney, [place] Worker  [facility] Care Management [phone]

## 2022-06-24 NOTE — Progress Notes (Signed)
COMMUNITY PALLIATIVE CARE SW NOTE  PATIENT NAME: Keilynn Decoux DOB: 1940-12-08 MRN: [id number]  PRIMARY CARE PROVIDER: [name], MD  RESPONSIBLE PARTY:  Acct ID - Guarantor Home Phone Work Phone Relationship Acct Type  [id number] TORIONA, DEPIES [phone]  Self P/F     [place], [place], [place] [address]     PLAN OF CARE and INTERVENTIONS:              GOALS OF CARE/ ADVANCE CARE PLANNING:    Goals include to maximize quality of life and assist with pain management. Our advance care planning conversation included a discussion about:    The value and importance of advance care planning  Review and updating or creation of an advance directive document.                          Code Status: DNR                         ACD: none on file. Ongoing discussion.  2.        SOCIAL/EMOTIONAL/SPIRITUAL ASSESSMENT/ INTERVENTIONS:         Palliative care encounter: SW and RN completed initial in home visit with patient and patients daughter Elmo Putt. - patient currently resides with daughter and son-in-law in [place], [place].   Presenting problem: Patient with recent hospitalization 2/29-3/6 due to AKI. Patient has very low energy and no strength. Patient received ying in bed, patient is appears very frail and speaking faintly. Palliative care vs hospice services discussed and reviewed. Consent form signed.  Pancreatic cancer: patients treatments are on hold at this time due to patients weakness.   Appetite: patients appetite is poor. With significant weight loss over the past 3 months.  Mobility: patients mobility is poor. Patient requires assistance with transfers and repositioning. Mainly wheel chair bound. Per daughter patient has not gotten up to use the restroom since the weekend and is predominantly using briefs for incontinence.   Hospice: education and information provided. Patient currently receiving [place] therapy. Daughter states that prior to hospitalization patient was ambulatory w/o  AD, able to maneuver stairs in the home and cook small meals. Presently patient is mainly bed and wheelchair bound. Patient is sleeping nearly half the day if not more and appetite has decreased significantly. Daughter share that will she will discuss hospice services with her family (patients spouse and other children) over the next few days.   Psychosocial assessment: completed.   In home support: Patients currently residing with daughter and son-in-law, whom are main caregivers. Patient has [facility] that provides home health therapy.  Transportation: daughter has expressed need for transportation assistance previously. [facility] case manager has been attempting to reach daughter to discuss and provide resources. Daughter to outreach [facility] case manager  Food: no food insecurities witnessed.   Safety and long term planning: no safety concerns witnessed.    SW discussed goals, reviewed care plan, provided emotional support, used active and reflective listening in the form of reciprocity emotional response. Questions and concerns were addressed. The patient/family was encouraged to call with any additional questions and/or concerns. PC Provided general support and encouragement, no other unmet needs identified. Will continue to follow.  Palliative care to follow up 2 weeks to assess patient status.   3.         PATIENT/CAREGIVER EDUCATION/ COPING:   Appearance: well groomed, appropriate given situation  Mental Status: fairly alert. Eye  Contact: poor. Patient laid with her eyes closed majority of visit  Thought Process: irrational  Thought Content: not assessed  Speech: faint and low  Mood: Normal and calm Affect: Congruent to endorsed mood, full ranging Insight: poor-fair Judgement: poor-fair Interaction Style: Cooperative   Patient A&O to self, patient was able to share her home address during visit, as she made the statement that she was "going home today". Patient did not engage in fluent  conversation due to being drowsy, but was able to answer simple questions when asked directly.    4.         PERSONAL EMERGENCY PLAN:  daughter will call 9-1-1 for emergencies.    5.         COMMUNITY RESOURCES COORDINATION/ HEALTH CARE NAVIGATION:  daughter manages her care.    6.      FINANCIAL CONCERNS/NEEDS: none                         Primary Health Insurance:  Medicare Secondary Health Insurance: none Prescription Coverage: Yes, no history of difficulty obtaining or affording prescriptions reported     SOCIAL HX:  Social History   Tobacco Use   Smoking status: Never   Smokeless tobacco: Never   Tobacco comments:    smoking cessation materials not required  Substance Use Topics   Alcohol use: No    Alcohol/week: 0.0 standard drinks of alcohol    CODE STATUS: DNR  ADVANCED DIRECTIVES: N MOST FORM COMPLETE: N HOSPICE EDUCATION PROVIDED: Y  PPS:30%      [place], [place]

## 2022-06-24 NOTE — Progress Notes (Addendum)
PATIENT NAME: Meredith Wilcox DOB: [birth year] MRN: [id number]  PRIMARY CARE PROVIDER: [name], MD  RESPONSIBLE PARTY:  Acct ID - Guarantor Home Phone Work Phone Relationship Acct Type  [id number] OMAYA, [place] [phone]  Self P/F     [address] [place], [place], [place] [address]   Home visit completed with patient, daughter Elmo Putt and [place], [place].   Patient found in the bed with eyes closed.  Awakens easily to verbal stimulation and answer questions appropriately.   Very weak and frail in appearance.   Limited engagement from patient during this visit.  Daughter provided most of information and update on patient since discharge from the hospital.   ACP:  DNR in place.    Appetite:  Very poor.  Daughter is trying to offer foods and liquids to patient.  Typically patient is only taking small amounts of food.  Notes changes in her taste and nothing seems to taste good to her.  Daughter reports significant weight loss over the last 2-3 months.   Hospice:  Discussed at length with daughter Elmo Putt.  Her brother will be in from [facility] tomorrow.  Daughter would like to discuss this with her brothers, father and pray about the situation before making a decision.  Home Health Service:  Currently working [facility] PT/OT weekly.   Palliative Care:  Discussed services.  Consent signed.  At this time, daughter would like a follow up call in a couple of weeks and will determine if a visit is needed.   Pain:  None reported by patient.   Malignant Tumor of the Pancreas Head:  Chemo treatments are currently on hold due to patient decline.  Uncertain if patient will be able to restart due to significant decline and ongoing weakness.  Weakness:  Patient requires extensive assistance with ADL's.  Daughter is trying to ambulate her for short distances and she will sit in the sitting area for short periods of time.  She is most comfortable in the bed.      CODE STATUS: DNR-form in the home.  ADVANCED  DIRECTIVES:  MOST FORM: No PPS: 40%-weak   PHYSICAL EXAM:   VITALS: Today's Vitals   06/24/22 1120  BP: 138/68  Pulse: 93  Temp: 97.8 F (36.6 C)  SpO2: 90%    LUNGS: decreased breath sounds CARDIAC: Cor RRR}  EXTREMITIES: 1+ ankle edema SKIN: Skin color, texture, turgor normal. No rashes or lesions or blister present to sacral region.   NEURO: positive for gait problems and weakness       Lorenza Burton, RN

## 2022-06-25 ENCOUNTER — Telehealth: Payer: Self-pay | Admitting: *Deleted

## 2022-06-25 NOTE — Telephone Encounter (Signed)
   Telephone encounter was:  Unsuccessful.  06/25/2022 Name: Tamantha Sedberry MRN: [id number] DOB: Jan 27, 1941  Unsuccessful outbound call made today to assist with:  Transportation Needs   Outreach Attempt:  2nd Attempt  A HIPAA compliant voice message was left requesting a return call.  Instructed patient to call back at [phone]. [place] [phone] [address] , [place] [address] Email : [email or web address]

## 2022-06-25 NOTE — Telephone Encounter (Unsigned)
Copied from [place] [phone]. Topic: General - Other >> Jun 25, 2022  8:43 AM [place] B wrote: Reason for CRM: Amy from [facility] says no pt orders are longer needed. She is just doing evaluaiton

## 2022-06-28 ENCOUNTER — Ambulatory Visit: Payer: Self-pay | Admitting: *Deleted

## 2022-06-28 ENCOUNTER — Telehealth: Payer: Self-pay

## 2022-06-28 ENCOUNTER — Telehealth: Payer: Self-pay | Admitting: *Deleted

## 2022-06-28 NOTE — Patient Outreach (Signed)
  Care Coordination   Follow Up Visit Note   06/28/2022 Name: Meredith Wilcox MRN: [id number] DOB: 1941-01-22  Meredith Wilcox is a 82 y.o. year old female who sees [name], MD for primary care. I spoke with  Meredith Wilcox's daughter by phone today.  What matters to the patients health and wellness today?  Transportation to medical appointment    Goals Addressed             This Visit's Progress    Transportation to hospital follow up appointment       Care Coordination Interventions: Confirmed that [employer] has now delivered wheelchair Transportation arranged for provider office visit for tomorrow by Careguide Confirmed with daughter that patient appetite has decreased(not eating/drinking), sleep has increased, spouse contacted provider's office, daughter will notify palliative care nurse          SDOH assessments and interventions completed:  No     Care Coordination Interventions:  Yes, provided  Interventions Today    Flowsheet Row Most Recent Value  Chronic Disease   Chronic disease during today's visit Other  [pancreatic cancer]  General Interventions   General Interventions Discussed/Reviewed General Interventions Reviewed, Durable Medical Equipment (DME)  [confirmed that wheelchair has been received]  Museum/gallery conservator (DME) [place] Discussed/Reviewed Other  [emotional support provided related to patient's condition progressing]       Follow up plan: Follow up call scheduled for 07/12/22    Encounter Outcome:  Pt. Visit Completed

## 2022-06-28 NOTE — Patient Instructions (Signed)
Visit Information  Thank you for taking time to visit with me today. Please don't hesitate to contact me if I can be of assistance to you.   Following are the goals we discussed today:   Goals Addressed             This Visit's Progress    Transportation to hospital follow up appointment       Care Coordination Interventions: Confirmed that [employer] has now delivered wheelchair Transportation arranged for provider office visit for tomorrow by Careguide Confirmed with daughter that patient appetite has decreased(not eating/drinking), sleep has increased, spouse contacted provider's office, daughter will notify palliative care nurse          Our next appointment is by telephone on 07/12/22 at 11am  Please call the care guide team at [phone] if you need to cancel or reschedule your appointment.   If you are experiencing a Mental Health or [place] or need someone to talk to, please call 911   Patient verbalizes understanding of instructions and care plan provided today and agrees to view in [place]. Active MyChart status and patient understanding of how to access instructions and care plan via MyChart confirmed with patient.     Telephone follow up appointment with care management team member scheduled for: 07/12/22  Elliot Gurney, [place] Worker  [facility] Care Management [phone]

## 2022-06-28 NOTE — Telephone Encounter (Signed)
Meredith Wilcox returning [name] call / please advise

## 2022-06-28 NOTE — Telephone Encounter (Signed)
Attempted to reach patient/spouse to check patient status from call on 06/27/2022. No record of patient being seen in ED, no answer when called, left voicemail for return call.

## 2022-06-28 NOTE — Telephone Encounter (Signed)
Pt's husband calling with concerns pt not eating or drinking S/P hospital discharge. Pt not with husband, he did not have essential information, pt is at daughters '[facility].'   Called and left VM to call back. [phone]

## 2022-06-28 NOTE — Progress Notes (Unsigned)
Name: Meredith Wilcox   MRN: [place]:[id number]    DOB: Dec 14, 1940   Date:06/29/2022       Progress Note  [place]  HPI  Admitted: 06/10/22 Discharged: 06/16/22  Patient is here today for a hospital discharge follow up. She started to feel weak after chemotherapy infusion on 02/22  she fell a few days later and continued to feel weak , developed lack of appetite and drank very little the day prior to the family taking her to [facility] on 06/10/2022 for evaluation. She had a blood transfusion , IV fluids and continues to feel lethargic during her stay , also poor appetite. Family asked for NG tube placement but due to her diagnosis of pancreatic cancer and risk of aspiration pneumonia - Dr. Grayland Ormond did not recommend it. They discussed hospice but family denied. She went home with a poor prognosis on 06/16/2022.  Today she came in with her husband and two children Alisha and Timmothy, during the visit I also spoke on the phone with her son Chrissie Noa that lives in [place]. Patient is cachetic with temporal waisting, very somnolent, but tries to open eyes when spoken to.   I spent most of the visit discussing her prognosis, explained that she is actively dying and that giving her IV fluids will only prolonged the time of her death but not change the outcome. I explained that hospice care can help with the transition at home or even at a hospice home. It will also help her husband after she dies with supportive services for the grieving.   Family members cried, I answered questions, they will decide on either taking her back to [facility] or call back for hospice referral.   Patient Active Problem List   Diagnosis Date Noted   Protein-calorie malnutrition, severe 06/11/2022   Anemia of chronic disease 06/11/2022   Hypokalemia 06/11/2022   Acute kidney injury ([place]) 06/11/2022   Palliative care encounter 06/11/2022   AKI (acute kidney injury) ([place]) 06/10/2022   Generalized weakness  06/10/2022   Ground-level fall 06/10/2022   Thrombocytopenia ([place]) 06/10/2022   Malignant tumor of head of pancreas ([place]) 03/15/2022   Other neutropenia ([place]) 11/16/2021   Primary osteoarthritis of left knee 11/16/2021   Obstructive jaundice 07/07/2021   Abnormal CT of the pancreas 07/07/2021   GERD (gastroesophageal reflux disease) 07/07/2021   Hypertension 07/07/2021   Pancreatic mass    Vaginal discharge 01/27/2017   Urethral caruncle 12/21/2016   Cystocele, midline 12/21/2016   Vaginal atrophy 12/21/2016   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   Benign migrating glossitis [id number]   History of Helicobacter pylori infection 11/16/2014   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 11/16/2014   Incontinence 11/16/2014   Prolapse of urethra 11/16/2014    Past Surgical History:  Procedure Laterality Date   BREAST BIOPSY Left 10/11/2016   left breast stereo/ VASCULAR LESION   BREAST BIOPSY Left 12/02/2016   Procedure: NEEDLE LOCALIZATION;  Surgeon: [name], MD;  Location: [facility] ORS;  Service: General;  Laterality: Left;   BREAST EXCISIONAL BIOPSY Left 12/02/2016   left lumpectomy   BREAST LUMPECTOMY Left 12/02/2016   Procedure: EXCISION BREAST MASS;  Surgeon: [name], MD;  Location: [facility] ORS;  Service: General;  Laterality: Left;   DILATION AND CURETTAGE OF UTERUS     dnc     1983   ERCP N/A 07/07/2021   Procedure: ENDOSCOPIC RETROGRADE CHOLANGIOPANCREATOGRAPHY (ERCP);  Surgeon: [name], MD;  Location: [facility] ENDOSCOPY;  Service: Endoscopy;  Laterality: N/A;   ERCP N/A 10/01/2021   Procedure: ENDOSCOPIC RETROGRADE CHOLANGIOPANCREATOGRAPHY (ERCP);  Surgeon: [name], MD;  Location: [facility] ENDOSCOPY;  Service: Endoscopy;  Laterality: N/A;   fatty tumor removal Right    hand   IR IMAGING GUIDED PORT INSERTION  04/06/2022    Family History  Problem Relation Age of Onset   Diabetes Mother    Hypertension Mother    Cardiomyopathy  Sister    Down syndrome Sister    Leukemia Brother    Alcohol abuse Son        youngest son   Asthma Son        youngest son   Rheum arthritis Sister    Drug abuse Child 25   Breast cancer Neg Hx    Kidney cancer Neg Hx    Bladder Cancer Neg Hx    Prostate cancer Neg Hx     Social History   Tobacco Use   Smoking status: Never   Smokeless tobacco: Never   Tobacco comments:    smoking cessation materials not required  Substance Use Topics   Alcohol use: No    Alcohol/week: 0.0 standard drinks of alcohol     Current Outpatient Medications:    amLODipine (NORVASC) 2.5 MG tablet, Take 1 tablet (2.5 mg total) by mouth every evening., Disp: 90 tablet, Rfl: 0   ondansetron (ZOFRAN) 4 MG tablet, Take 1 tablet (4 mg total) by mouth every 6 (six) hours as needed for nausea., Disp: 20 tablet, Rfl: 0  Allergies  Allergen Reactions   Tussionex Pennkinetic Er [Hydrocod Poli-Chlorphe Poli Er] Other (See Comments)    Real dizzy headed even after a while still walking every direction but straight, patient stated she stopped taking it because of that.   Hydrocodone     Light headed and dizziness    I personally reviewed active problem list, medication list, allergies, family history, social history, health maintenance with the patient/caregiver today.   ROS  Not eating for days, not drinking past two days, sleeping most of the time. Weak  Not in pain   Objective  Vitals:   06/29/22 1134  BP: 122/68  Pulse: 81  Resp: 16    There is no height or weight on file to calculate BMI.  Physical Exam  Constitutional: Patient appears cachetic, somnolent, dry mouth  No distress.  HEENT: head atraumatic, temporal waisting  Cardiovascular: Normal rate, regular rhythm and normal heart sounds.  No murmur heard. No BLE edema. Pulmonary/Chest: Effort normal and breath sounds normal.  Abdominal: Soft.  There is no tenderness. Psychiatric: responds to voice   PHQ2/9:    03/15/2022     1:06 PM 11/16/2021    9:57 AM 08/13/2021    9:58 AM 07/06/2021    8:57 AM 05/18/2021    8:30 AM  Depression screen PHQ 2/9  Decreased Interest 0 0 0 0 0  Down, Depressed, Hopeless 0 0 0 0 0  PHQ - 2 Score 0 0 0 0 0  Altered sleeping 0 0 0 0 0  Tired, decreased energy 0 0 0 0 0  Change in appetite 0 0 0 0 0  Feeling bad or failure about yourself  0 0 0 0 0  Trouble concentrating 0 0 0 0 0  Moving slowly or fidgety/restless 0 0 0 0 0  Suicidal thoughts 0 0 0 0 0  PHQ-9 Score 0 0 0 0 0  Difficult doing work/chores    Not difficult  at all     phq 9 is negative   Fall Risk:    06/29/2022   11:46 AM 03/15/2022    1:06 PM 11/16/2021    9:57 AM 08/13/2021    9:58 AM 07/06/2021    8:57 AM  Fall Risk   Falls in the past year? Exclusion - non ambulatory 0 0 0 0  Number falls in past yr:  0   0  Injury with Fall?  0   0  Risk for fall due to :  No Fall Risks No Fall Risks No Fall Risks No Fall Risks  Follow up  Falls prevention discussed Falls prevention discussed Falls prevention discussed Falls prevention discussed      Functional Status Survey: Is the patient deaf or have difficulty hearing?: No Does the patient have difficulty seeing, even when wearing glasses/contacts?: Yes Does the patient have difficulty concentrating, remembering, or making decisions?: Yes Does the patient have difficulty walking or climbing stairs?: Yes Does the patient have difficulty dressing or bathing?: Yes Does the patient have difficulty doing errands alone such as visiting a doctor's office or shopping?: Yes    Assessment & Plan  1. Hospital discharge follow-up  Reviewed records, spoke to family , waiting to place hospice referral once family is ready  No longer taking medications   2. 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 Pancreatic cancer metastasized to liver [facility])

## 2022-06-28 NOTE — Telephone Encounter (Signed)
Attempted to reach daughter, left voicemail.   Copied from [place] [id number]. Topic: General - Other >> Jun 28, 2022  9:21 AM [place] C wrote: Reason for CRM: The patient's daughter has returned a missed call from [place]  Please contact again when possible

## 2022-06-28 NOTE — Telephone Encounter (Signed)
   Telephone encounter was:  Successful.  06/28/2022 Name: Meredith Wilcox MRN: [place]:[id number] DOB: 09/01/1940  Meredith Wilcox is a 82 y.o. year old female who is a primary care patient of [name], MD . The community resource team was consulted for assistance with Transportation Needs   Care guide performed the following interventions: Patient provided with information about care guide support team and interviewed to confirm resource needs.  Follow Up Plan:  No further follow up planned at this time. The patient has been provided with needed resources.  [place] [phone] [address] , [place] [address] Email : [email or web address]

## 2022-06-29 ENCOUNTER — Other Ambulatory Visit: Payer: Medicare Other

## 2022-06-29 ENCOUNTER — Ambulatory Visit (INDEPENDENT_AMBULATORY_CARE_PROVIDER_SITE_OTHER): Payer: Medicare Other | Admitting: Family Medicine

## 2022-06-29 ENCOUNTER — Telehealth: Payer: Self-pay

## 2022-06-29 ENCOUNTER — Encounter: Payer: Self-pay | Admitting: Family Medicine

## 2022-06-29 ENCOUNTER — Telehealth: Payer: Self-pay | Admitting: Family Medicine

## 2022-06-29 VITALS — BP 122/68 | HR 81 | Resp 16

## 2022-06-29 DIAGNOSIS — E876 Hypokalemia: Secondary | ICD-10-CM | POA: Diagnosis not present

## 2022-06-29 DIAGNOSIS — D630 Anemia in neoplastic disease: Secondary | ICD-10-CM | POA: Diagnosis not present

## 2022-06-29 DIAGNOSIS — R64 Cachexia: Secondary | ICD-10-CM

## 2022-06-29 DIAGNOSIS — C250 Malignant neoplasm of head of pancreas: Secondary | ICD-10-CM | POA: Diagnosis not present

## 2022-06-29 DIAGNOSIS — I10 Essential (primary) hypertension: Secondary | ICD-10-CM | POA: Diagnosis not present

## 2022-06-29 DIAGNOSIS — Z515 Encounter for palliative care: Secondary | ICD-10-CM

## 2022-06-29 DIAGNOSIS — C259 Malignant neoplasm of pancreas, unspecified: Secondary | ICD-10-CM | POA: Diagnosis not present

## 2022-06-29 DIAGNOSIS — Z09 Encounter for follow-up examination after completed treatment for conditions other than malignant neoplasm: Secondary | ICD-10-CM

## 2022-06-29 DIAGNOSIS — C787 Secondary malignant neoplasm of liver and intrahepatic bile duct: Secondary | ICD-10-CM

## 2022-06-29 DIAGNOSIS — E43 Unspecified severe protein-calorie malnutrition: Secondary | ICD-10-CM | POA: Diagnosis not present

## 2022-06-29 DIAGNOSIS — D696 Thrombocytopenia, unspecified: Secondary | ICD-10-CM | POA: Diagnosis not present

## 2022-06-29 NOTE — Telephone Encounter (Unsigned)
Copied from [place] [id number]. Topic: General - Other >> Jun 29, 2022  4:09 PM Everette C wrote: Reason for CRM: Ms. Meredith Wilcox the social worker with [facility] has called to request verbal orders for hospice care for the patient  Please contact further when possible

## 2022-06-29 NOTE — Telephone Encounter (Signed)
Palliative care SW attempted to return patients daughter, Elmo Putt, [place].  LVM. Awaiting for return call.

## 2022-06-29 NOTE — Progress Notes (Signed)
(  3:59 PM)                                                   TELEPHONE ENCOUNTER  Incoming call from patients daughter, Elmo Putt.  Daughter shared that patient has not really been eating, drinking or taking medications that past couple of days. Daughter states that patient has been sleeping a lot more.   Per daughter patient saw PCP today and was advised to consider/discuss end of life planning with her family sooner than  later. Daughter states that she has spoke with her father and siblings and they are all on board for hospice services for patient. SW provided education hospice support and services. Daughter in agreement for SW to outreach PCP for verbal order to initiate hospice referral. Emotional support provided during call.  (4:10 PM)  PC SW outreached PCP office to obtain verbal order for hospice.

## 2022-06-30 DIAGNOSIS — I10 Essential (primary) hypertension: Secondary | ICD-10-CM | POA: Diagnosis not present

## 2022-06-30 DIAGNOSIS — C259 Malignant neoplasm of pancreas, unspecified: Secondary | ICD-10-CM | POA: Diagnosis not present

## 2022-06-30 DIAGNOSIS — C787 Secondary malignant neoplasm of liver and intrahepatic bile duct: Secondary | ICD-10-CM | POA: Diagnosis not present

## 2022-06-30 DIAGNOSIS — K219 Gastro-esophageal reflux disease without esophagitis: Secondary | ICD-10-CM | POA: Diagnosis not present

## 2022-06-30 DIAGNOSIS — D630 Anemia in neoplastic disease: Secondary | ICD-10-CM | POA: Diagnosis not present

## 2022-06-30 NOTE — Telephone Encounter (Signed)
Voicemail left on secure line for [place] giving verbal order per Dr. Ancil Boozer.

## 2022-07-01 ENCOUNTER — Ambulatory Visit: Payer: Medicare Other

## 2022-07-01 ENCOUNTER — Ambulatory Visit: Payer: Medicare Other | Admitting: Oncology

## 2022-07-01 ENCOUNTER — Other Ambulatory Visit: Payer: Medicare Other

## 2022-07-01 DIAGNOSIS — D630 Anemia in neoplastic disease: Secondary | ICD-10-CM | POA: Diagnosis not present

## 2022-07-01 DIAGNOSIS — C787 Secondary malignant neoplasm of liver and intrahepatic bile duct: Secondary | ICD-10-CM | POA: Diagnosis not present

## 2022-07-01 DIAGNOSIS — I10 Essential (primary) hypertension: Secondary | ICD-10-CM | POA: Diagnosis not present

## 2022-07-01 DIAGNOSIS — C259 Malignant neoplasm of pancreas, unspecified: Secondary | ICD-10-CM | POA: Diagnosis not present

## 2022-07-01 DIAGNOSIS — K219 Gastro-esophageal reflux disease without esophagitis: Secondary | ICD-10-CM | POA: Diagnosis not present

## 2022-07-02 DIAGNOSIS — K219 Gastro-esophageal reflux disease without esophagitis: Secondary | ICD-10-CM | POA: Diagnosis not present

## 2022-07-02 DIAGNOSIS — I10 Essential (primary) hypertension: Secondary | ICD-10-CM | POA: Diagnosis not present

## 2022-07-02 DIAGNOSIS — C787 Secondary malignant neoplasm of liver and intrahepatic bile duct: Secondary | ICD-10-CM | POA: Diagnosis not present

## 2022-07-02 DIAGNOSIS — D630 Anemia in neoplastic disease: Secondary | ICD-10-CM | POA: Diagnosis not present

## 2022-07-02 DIAGNOSIS — C259 Malignant neoplasm of pancreas, unspecified: Secondary | ICD-10-CM | POA: Diagnosis not present

## 2022-07-03 DIAGNOSIS — I10 Essential (primary) hypertension: Secondary | ICD-10-CM | POA: Diagnosis not present

## 2022-07-03 DIAGNOSIS — K219 Gastro-esophageal reflux disease without esophagitis: Secondary | ICD-10-CM | POA: Diagnosis not present

## 2022-07-03 DIAGNOSIS — C787 Secondary malignant neoplasm of liver and intrahepatic bile duct: Secondary | ICD-10-CM | POA: Diagnosis not present

## 2022-07-03 DIAGNOSIS — C259 Malignant neoplasm of pancreas, unspecified: Secondary | ICD-10-CM | POA: Diagnosis not present

## 2022-07-03 DIAGNOSIS — D630 Anemia in neoplastic disease: Secondary | ICD-10-CM | POA: Diagnosis not present

## 2022-07-05 ENCOUNTER — Inpatient Hospital Stay: Payer: Medicare Other | Admitting: Hospice and Palliative Medicine

## 2022-07-05 ENCOUNTER — Telehealth: Payer: Self-pay | Admitting: Hospice and Palliative Medicine

## 2022-07-05 NOTE — Telephone Encounter (Signed)
I was informed by hospice triage that patient died over the weekend on 2022/07/22.

## 2022-07-08 ENCOUNTER — Other Ambulatory Visit: Payer: Medicare Other

## 2022-07-08 ENCOUNTER — Ambulatory Visit: Payer: Medicare Other

## 2022-07-08 ENCOUNTER — Ambulatory Visit: Payer: Medicare Other | Admitting: Oncology

## 2022-07-09 ENCOUNTER — Telehealth: Payer: Self-pay | Admitting: Family Medicine

## 2022-07-09 NOTE — Telephone Encounter (Signed)
[name] (spouse) wanted to inform you of [facility] funeral arragements: When: April 5th 2024 Time: Wake at 103 am and funeral begins at 12 pm.  Where: [place] Address: [address] [place]

## 2022-07-12 ENCOUNTER — Encounter: Payer: Medicare Other | Admitting: *Deleted

## 2022-07-12 DEATH — deceased

## 2022-07-15 ENCOUNTER — Telehealth: Payer: Self-pay

## 2022-07-15 ENCOUNTER — Other Ambulatory Visit: Payer: Medicare Other

## 2022-07-15 ENCOUNTER — Ambulatory Visit: Payer: Medicare Other | Admitting: Oncology

## 2022-07-15 ENCOUNTER — Ambulatory Visit: Payer: Medicare Other

## 2022-07-22 ENCOUNTER — Other Ambulatory Visit: Payer: Medicare Other

## 2022-07-22 ENCOUNTER — Ambulatory Visit: Payer: Medicare Other | Admitting: Oncology

## 2022-07-22 ENCOUNTER — Ambulatory Visit: Payer: Medicare Other

## 2022-07-22 NOTE — Telephone Encounter (Signed)
Has this been completed?
